# Patient Record
Sex: Female | Born: 1995 | Race: Black or African American | Hispanic: No | Marital: Single | Smoking: Former smoker
Health system: Southern US, Community
[De-identification: ages and names within clinical notes are randomized; demographics above are authoritative.]

## PROBLEM LIST (undated history)

## (undated) ENCOUNTER — Inpatient Hospital Stay (HOSPITAL_COMMUNITY): Payer: Self-pay

## (undated) DIAGNOSIS — Z8619 Personal history of other infectious and parasitic diseases: Secondary | ICD-10-CM

## (undated) DIAGNOSIS — B009 Herpesviral infection, unspecified: Secondary | ICD-10-CM

## (undated) DIAGNOSIS — D649 Anemia, unspecified: Secondary | ICD-10-CM

## (undated) DIAGNOSIS — L709 Acne, unspecified: Secondary | ICD-10-CM

## (undated) DIAGNOSIS — R519 Headache, unspecified: Secondary | ICD-10-CM

## (undated) HISTORY — PX: WISDOM TOOTH EXTRACTION: SHX21

## (undated) HISTORY — DX: Acne, unspecified: L70.9

## (undated) HISTORY — DX: Personal history of other infectious and parasitic diseases: Z86.19

## (undated) HISTORY — PX: HERNIA REPAIR: SHX51

---
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
## 2013-09-13 ENCOUNTER — Encounter: Payer: PRIVATE HEALTH INSURANCE | Admitting: Family Medicine

## 2013-09-22 ENCOUNTER — Ambulatory Visit (INDEPENDENT_AMBULATORY_CARE_PROVIDER_SITE_OTHER): Payer: PRIVATE HEALTH INSURANCE | Admitting: Family Medicine

## 2013-09-22 ENCOUNTER — Encounter: Payer: Self-pay | Admitting: Family Medicine

## 2013-09-22 VITALS — BP 110/74 | HR 72 | Temp 98.3°F | Ht 63.0 in | Wt 179.0 lb

## 2013-09-22 DIAGNOSIS — E669 Obesity, unspecified: Secondary | ICD-10-CM

## 2013-09-22 NOTE — Patient Instructions (Signed)
I recommend that you meet with our Registered Dietitian (Nutritionist), Dr. Vickki Muff for help addressing your dietary and eating habits.  You can schedule an appointment with her by calling her directly at [phone].   Fill out a food journal before you see the nutritionist.  Increase your exercise activity to 3 times a week.   Follow up with me in 2 months.

## 2013-09-23 NOTE — Assessment & Plan Note (Addendum)
Patient concerned about her weight and motivated to loose it.  Nexplanon may be playing a role in her weight gain, but I would like to try diet and exercise changes prior to removing it. If in 2-3 months with diet changes, she has not lost any weight or gained weight, we will discuss removing it.  In the meantime, recommended that patient meet with Dr. Gerilyn Pilgrim for diet management. Gave her food record to fill out and bring to the visit. Increase activity level to walking/jogging 3 times a week.

## 2013-09-23 NOTE — Progress Notes (Signed)
Patient ID: Peggy Mason    DOB: Mar 02, 1996, 17 y.o.   MRN: [id number] --- Subjective:  Peggy Mason is a 17 y.o.female who presents for initial evaluation for weight management.  - weight management: patient had a baby in November of 2013. She was 177lbs after delivery and lost weight down to 152 lbs in January 2014. She had nexplanon inserted at the time. Since then, she has gained 20 pounds. She reports feeling hungry all the time and eating to the point of being overly full.  She reports often eating at fastfood places like [employer]. For snacks, she eats chips and crackers. She reports that if she doesn't have time to eat at lunch, she eats a candy bar. She also states that she sometimes eats because she feels bored.  - exercise: she reports that she walks, about twice a week. 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   ROS: see HPI Past Medical History: reviewed and updated medications and allergies. Social History: Tobacco: none  Objective: Filed Vitals:   09/22/13 1116  BP: 110/74  Pulse: 72  Temp: 98.3 F (36.8 C)    Physical Examination:   General appearance - alert, well appearing, and in no distress Chest - clear to auscultation, no wheezes, rales or rhonchi, symmetric air entry Heart - normal rate, regular rhythm, normal S1, S2, no murmurs, rubs, clicks or gallops Neck - supple, no thyroid enlargement noted

## 2014-01-06 ENCOUNTER — Ambulatory Visit (INDEPENDENT_AMBULATORY_CARE_PROVIDER_SITE_OTHER): Payer: PRIVATE HEALTH INSURANCE | Admitting: Family Medicine

## 2014-01-06 ENCOUNTER — Encounter: Payer: Self-pay | Admitting: Family Medicine

## 2014-01-06 VITALS — BP 120/65 | HR 81 | Temp 98.4°F | Ht 63.0 in | Wt 179.0 lb

## 2014-01-06 DIAGNOSIS — L299 Pruritus, unspecified: Secondary | ICD-10-CM

## 2014-01-06 DIAGNOSIS — E669 Obesity, unspecified: Secondary | ICD-10-CM

## 2014-01-06 MED ORDER — TRIAMCINOLONE ACETONIDE 0.1 % EX CREA: 1.0000 "application " | TOPICAL_CREAM | Freq: Two times a day (BID) | CUTANEOUS | Status: DC

## 2014-01-06 MED ORDER — PRAMOXINE-BENZYL ALCOHOL 1-10 % EX GEL: 1.0000 "application " | Freq: Three times a day (TID) | CUTANEOUS | Status: DC | PRN

## 2014-01-06 NOTE — Assessment & Plan Note (Signed)
No evidence of irritation on exam. Symptoms have resolved on their own. For next recurrence of itching, prescribed triamcinolone cream and pramoxine

## 2014-01-06 NOTE — Progress Notes (Signed)
Patient ID: Peggy Mason    DOB: Jul 05, 1996, 18 y.o.   MRN: [id number] --- Subjective:  Peggy Mason is a 18 y.o.female who presents with itching and irritation at the site of her on her left arm. Started 3 days ago. Now better. Had been scratching it. Otherwise no swelling, no redness, no warmth. No break in skin. No fever.  Obesity: Has been exercising once or twice a week. She walks 3/2 miles once per week. She admits to skipping meals do to busy schedules. She was hoping to try a diet around smoothies that has helped her cousin.  ROS: see HPI Past Medical History: reviewed and updated medications and allergies. Social History: Tobacco: none  Objective: Filed Vitals:   01/06/14 1451  BP: 120/65  Pulse: 81  Temp: 98.4 F (36.9 C)    Physical Examination:   General appearance - alert, well appearing, and in no distress  Chest - clear to auscultation, no wheezes, rales or rhonchi, symmetric air entry Heart - normal rate, regular rhythm, normal S1, S2, no murmurs, rubs, clicks or gallops Skin - left  Upper arm: Normal-appearing skin, no erythema, no excoriation, Ride palpable and in place and intact measuring 4 cm. No tenderness on palpation.

## 2014-01-06 NOTE — Assessment & Plan Note (Signed)
Patient is to make appointment for nutrition clinic with me at the end of April

## 2014-01-06 NOTE — Patient Instructions (Signed)
Please make an appointment at the front desk for nutrition clinic with Dr. Gerilyn PilgrimSykes and me on April 23rd.   For the itching, We are going to use an over the counter medicine called itch x to use to wait for the steroid cream to kick in for the itching.

## 2014-02-02 ENCOUNTER — Encounter: Payer: Self-pay | Admitting: Family Medicine

## 2014-02-02 ENCOUNTER — Ambulatory Visit (INDEPENDENT_AMBULATORY_CARE_PROVIDER_SITE_OTHER): Payer: Self-pay | Admitting: Family Medicine

## 2014-02-02 VITALS — BP 124/78 | HR 80 | Ht 63.0 in | Wt 179.9 lb

## 2014-02-02 DIAGNOSIS — E669 Obesity, unspecified: Secondary | ICD-10-CM

## 2014-02-02 NOTE — Progress Notes (Signed)
Patient ID: Peggy Mason    DOB: Jul 19, 1996, 18 y.o.   MRN: [id number] --- Subjective:  Peggy Mason is a 18 y.o.female who presents for follow up on weight management.   Nutrition Survey:   - Cut out soda completely. Drinks juice 1oz once or twice a week.  - Trying to cut out sweets - Stopped eating McDonald's at breakfast but continues to eat at fast food restaurant in the morning 2-3 times per week - Tried to stay away from chocolate  Diet:  Usual eating pattern includes 2 (breakfast or dinner) meals, often skips lunch and 1 snacks per day.  Barriers: time to eat lunch or prepare lunch or breakfast in the am. 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   24-hr recall: B ( 8:15 AM)-   Frosted flanks and blueberry bagel with butter, water Snk ( AM)-    L (2 PM)-  Noodles with cheese 1 bowl Snk ( PM)-  Fruit loops 1 bowl D ( 9 PM)-  2 slices of pizza Snk ( PM)-    Was this a typical day? Would probably eat more than that on a regular basis.   ROS: see HPI Past Medical History: reviewed and updated medications and allergies. Social History: Tobacco: none  Objective: Filed Vitals:   02/02/14 0924  BP: 124/78  Pulse: 80    Physical Examination:   General appearance - alert, well appearing, and in no distress Chest - clear to auscultation, no wheezes, rales or rhonchi, symmetric air entry Heart - normal rate, regular rhythm, normal S1, S2, no murmurs

## 2014-02-02 NOTE — Assessment & Plan Note (Addendum)
Weight is stable. Congratulated her on stopping sodas and drinking water. There likely still is an imbalance between the calories in and calories out.   Goals discussed today include:  - increase vegetables to at least 2 meals a day. Discussed option of getting frozen vegetables.  - eat 3 meals a day and log each day how many meals you eat - She would like to try to monitor her calories. Goal of 1600-1800 calories per day.  - limit fast food eating to once a week - follow up in 1 month

## 2014-02-02 NOTE — Patient Instructions (Signed)
Goals for this session: - increase vegetables to at least 2 meals a day - eat 3 meals a day and log each day how many meals you eat - limit fast food eating to once a week - follow up in 1 month

## 2014-02-28 ENCOUNTER — Ambulatory Visit: Payer: Self-pay | Admitting: Family Medicine

## 2014-07-13 ENCOUNTER — Encounter (HOSPITAL_COMMUNITY): Payer: Self-pay | Admitting: *Deleted

## 2014-07-13 ENCOUNTER — Inpatient Hospital Stay (HOSPITAL_COMMUNITY)
Admission: AD | Admit: 2014-07-13 | Discharge: 2014-07-13 | Disposition: A | Discharge status: Home / Self Care | Payer: BC Managed Care – PPO | Source: Ambulatory Visit | Attending: Obstetrics & Gynecology | Admitting: Obstetrics & Gynecology

## 2014-07-13 DIAGNOSIS — B3731 Acute candidiasis of vulva and vagina: Secondary | ICD-10-CM | POA: Insufficient documentation

## 2014-07-13 DIAGNOSIS — N760 Acute vaginitis: Secondary | ICD-10-CM

## 2014-07-13 DIAGNOSIS — N898 Other specified noninflammatory disorders of vagina: Secondary | ICD-10-CM | POA: Diagnosis present

## 2014-07-13 DIAGNOSIS — Z880 Allergy status to penicillin: Secondary | ICD-10-CM | POA: Insufficient documentation

## 2014-07-13 DIAGNOSIS — B373 Candidiasis of vulva and vagina: Secondary | ICD-10-CM | POA: Diagnosis not present

## 2014-07-13 LAB — POCT PREGNANCY, URINE: PREG TEST UR: NEGATIVE

## 2014-07-13 LAB — URINALYSIS, ROUTINE W REFLEX MICROSCOPIC
Bilirubin Urine: NEGATIVE
GLUCOSE, UA: NEGATIVE mg/dL
Ketones, ur: NEGATIVE mg/dL
Leukocytes, UA: NEGATIVE
Nitrite: NEGATIVE
PROTEIN: NEGATIVE mg/dL
SPECIFIC GRAVITY, URINE: 1.015 (ref 1.005–1.030)
Urobilinogen, UA: 1 mg/dL (ref 0.0–1.0)
pH: 7 (ref 5.0–8.0)

## 2014-07-13 LAB — URINE MICROSCOPIC-ADD ON

## 2014-07-13 LAB — WET PREP, GENITAL
Clue Cells Wet Prep HPF POC: NONE SEEN
Trich, Wet Prep: NONE SEEN
Yeast Wet Prep HPF POC: NONE SEEN

## 2014-07-13 MED ORDER — FLUCONAZOLE 150 MG PO TABS: 150.0000 mg | ORAL_TABLET | Freq: Every day | ORAL | Status: DC

## 2014-07-13 NOTE — Discharge Instructions (Signed)

## 2014-07-13 NOTE — MAU Note (Signed)
Pt states here for eval of vaginal irritation noted when she wipes after voiding and has intermittent itching. Was intimate with new partner and has heard rumor that he has herpes. Pt states she felt like she was getting a yeast infection prior to intercourse. Denies abnormal discharge however.

## 2014-07-13 NOTE — MAU Provider Note (Signed)
History     CSN: [id number]  Arrival date and time: 07/13/14 1231   First Provider Initiated Contact with Patient 07/13/14 1446      Chief Complaint  Patient presents with  . Vaginal Discharge   HPI   [name] is a 18 y.o.female G1P1001 who presents with vaginal irritation and vaginitis. The symptoms started about three weeks ago; she has a new sexual partner. She is concerned because she heard a rumor that her partner has herpes. She denies symptoms of herpes.    OB History   Grav Para Term Preterm Abortions TAB SAB Ect Mult Living   1 1 1  0 0 0 0 0 0 1      Past Medical History  Diagnosis Date  . Acne   . History of chlamydia infection     Past Surgical History  Procedure Laterality Date  . Hernia repair      umbilical    Family History  Problem Relation Age of Onset  . Asthma Sister   . Learning disabilities Sister   . Kidney disease Sister     dialysis    History  Substance Use Topics  . Smoking status: Never Smoker   . Smokeless tobacco: Never Used  . Alcohol Use: No    Allergies:  Allergies  Allergen Reactions  . Penicillins Hives    Prescriptions prior to admission  Medication Sig Dispense Refill  . [DISCONTINUED] Pramoxine-Benzyl Alcohol 1-10 % GEL Apply 1 application topically 3 (three) times daily as needed.  35.4 g  0  . [DISCONTINUED] triamcinolone cream (KENALOG) 0.1 % Apply 1 application topically 2 (two) times daily. Do not use on face  30 g  0   Results for orders placed during the hospital encounter of 07/13/14 (from the past 48 hour(s))  URINALYSIS, ROUTINE W REFLEX MICROSCOPIC     Status: Abnormal   Collection Time    07/13/14  1:51 PM      Result Value Ref Range   Color, Urine YELLOW  YELLOW   APPearance CLEAR  CLEAR   Specific Gravity, Urine 1.015  1.005 - 1.030   pH 7.0  5.0 - 8.0   Glucose, UA NEGATIVE  NEGATIVE mg/dL   Hgb urine dipstick TRACE (*) NEGATIVE   Bilirubin Urine NEGATIVE  NEGATIVE   Ketones, ur  NEGATIVE  NEGATIVE mg/dL   Protein, ur NEGATIVE  NEGATIVE mg/dL   Urobilinogen, UA 1.0  0.0 - 1.0 mg/dL   Nitrite NEGATIVE  NEGATIVE   Leukocytes, UA NEGATIVE  NEGATIVE  URINE MICROSCOPIC-ADD ON     Status: Abnormal   Collection Time    07/13/14  1:51 PM      Result Value Ref Range   Squamous Epithelial / LPF FEW (*) RARE   RBC / HPF 0-2  <3 RBC/hpf   Bacteria, UA FEW (*) RARE   Urine-Other MUCOUS PRESENT    POCT PREGNANCY, URINE     Status: None   Collection Time    07/13/14  2:05 PM      Result Value Ref Range   Preg Test, Ur NEGATIVE  NEGATIVE   Comment:            THE SENSITIVITY OF THIS     METHODOLOGY IS >24 mIU/mL  WET PREP, GENITAL     Status: Abnormal   Collection Time    07/13/14  2:54 PM      Result Value Ref Range   Yeast Wet Prep HPF POC  NONE SEEN  NONE SEEN   Trich, Wet Prep NONE SEEN  NONE SEEN   Clue Cells Wet Prep HPF POC NONE SEEN  NONE SEEN   WBC, Wet Prep HPF POC FEW (*) NONE SEEN   Comment: MODERATE BACTERIA SEEN    Review of Systems  Constitutional: Negative for fever and chills.  Gastrointestinal: Negative for abdominal pain.  Genitourinary:       + white vaginal discharge. No odor +vaginitis    Physical Exam   Blood pressure 135/61, pulse 83, temperature 98.2 F (36.8 C), temperature source Oral, resp. rate 18, height 5\' 2"  (1.575 m), weight 83.178 kg (183 lb 6 oz), last menstrual period 07/12/2014, not currently breastfeeding.  Physical Exam  Constitutional: She appears well-developed and well-nourished. No distress.  HENT:  Head: Normocephalic.  Eyes: Pupils are equal, round, and reactive to light.  Neck: Neck supple.  Respiratory: Effort normal.  GI: Soft. She exhibits no distension. There is no tenderness. There is no rebound and no guarding.  Genitourinary: Vaginal discharge found.  Speculum exam: Vagina - Moderate amount of creamy, brown discharge, no odor Cervix - No contact bleeding Bimanual exam: Cervix closed, no CMT  Uterus  non tender, normal size Adnexa non tender, no masses bilaterally GC/Chlam, wet prep done Chaperone present for exam.   Skin: Skin is warm. She is not diaphoretic.  Psychiatric: Her behavior is normal.    MAU Course  Procedures None  MDM Wet prep GC HIV UA   Assessment and Plan   A: Vaginitis Presumed yeast vaginitis  P: Discharge home in stable condition RX: Diflucan  Return to MAU as needed, for emergencies.   Iona HansenJennifer Irene Tinea Nobile, NP 07/13/2014 3:50 PM

## 2014-07-14 LAB — HIV ANTIBODY (ROUTINE TESTING W REFLEX): HIV: NONREACTIVE

## 2014-07-14 NOTE — MAU Provider Note (Signed)

## 2014-07-15 LAB — GC/CHLAMYDIA PROBE AMP
CT Probe RNA: POSITIVE — AB
GC Probe RNA: NEGATIVE

## 2014-07-18 ENCOUNTER — Telehealth: Payer: Self-pay

## 2014-07-18 DIAGNOSIS — A749 Chlamydial infection, unspecified: Secondary | ICD-10-CM

## 2014-07-18 MED ORDER — AZITHROMYCIN 500 MG PO TABS: 1000.0000 mg | ORAL_TABLET | Freq: Once | ORAL | Status: DC

## 2014-07-18 MED ORDER — FLUCONAZOLE 150 MG PO TABS: 150.0000 mg | ORAL_TABLET | Freq: Every day | ORAL | Status: DC

## 2014-07-18 NOTE — Telephone Encounter (Signed)
After checking with [facility] found out RX for Zithromax 1000mg  (2 500mg  tabs)would cost $19. Called patient and this is too expensive. [facility] Informed patient. Patient requests to have [facility]rom[facility]. Informed patient I would. No further questions or concerns.

## 2014-07-18 NOTE — Telephone Encounter (Signed)
Message copied by Louanna RawAMPBELL, Evanna Washinton M on Mon Jul 18, 2014 12:52 PM ------      Message from: Pennie BanterSMITH, MARNI W      Created: Mon Jul 18, 2014 11:21 AM       Patient returned call, notified her of positive chlamydia culture.  Patient has not been treated and will [facility] [address]  Instructed patient to notify her partner for treatment.  Report of treatment faxed to health department. ------

## 2014-07-18 NOTE — Telephone Encounter (Signed)
Called patient and informed her medication for chlamydia treatment sent to pharmacy. Patient states she does not have insurance and would like to know if she could get it sent somewhere else-- advised patient [facility] may have it for much cheaper. [facility] [address] Informed patient I would do so. NO further questions or concerns.

## 2014-08-15 ENCOUNTER — Encounter (HOSPITAL_COMMUNITY): Payer: Self-pay | Admitting: *Deleted

## 2014-12-06 ENCOUNTER — Emergency Department (HOSPITAL_COMMUNITY)
Admission: EM | Admit: 2014-12-06 | Discharge: 2014-12-06 | Disposition: A | Discharge status: Home / Self Care | Payer: BLUE CROSS/BLUE SHIELD | Attending: Emergency Medicine | Admitting: Emergency Medicine

## 2014-12-06 ENCOUNTER — Encounter (HOSPITAL_COMMUNITY): Payer: Self-pay

## 2014-12-06 DIAGNOSIS — M542 Cervicalgia: Secondary | ICD-10-CM | POA: Insufficient documentation

## 2014-12-06 DIAGNOSIS — Z880 Allergy status to penicillin: Secondary | ICD-10-CM | POA: Diagnosis not present

## 2014-12-06 DIAGNOSIS — Z8619 Personal history of other infectious and parasitic diseases: Secondary | ICD-10-CM | POA: Insufficient documentation

## 2014-12-06 DIAGNOSIS — Z872 Personal history of diseases of the skin and subcutaneous tissue: Secondary | ICD-10-CM | POA: Insufficient documentation

## 2014-12-06 DIAGNOSIS — J020 Streptococcal pharyngitis: Secondary | ICD-10-CM | POA: Diagnosis not present

## 2014-12-06 DIAGNOSIS — Z3202 Encounter for pregnancy test, result negative: Secondary | ICD-10-CM | POA: Diagnosis not present

## 2014-12-06 DIAGNOSIS — R51 Headache: Secondary | ICD-10-CM | POA: Diagnosis present

## 2014-12-06 LAB — I-STAT CHEM 8, ED
BUN: 9 mg/dL (ref 6–23)
CALCIUM ION: 1.1 mmol/L — AB (ref 1.12–1.23)
Chloride: 106 mmol/L (ref 96–112)
Creatinine, Ser: 0.5 mg/dL (ref 0.50–1.10)
GLUCOSE: 95 mg/dL (ref 70–99)
HEMATOCRIT: 38 % (ref 36.0–46.0)
HEMOGLOBIN: 12.9 g/dL (ref 12.0–15.0)
Potassium: 3.5 mmol/L (ref 3.5–5.1)
Sodium: 139 mmol/L (ref 135–145)
TCO2: 18 mmol/L (ref 0–100)

## 2014-12-06 LAB — URINE MICROSCOPIC-ADD ON

## 2014-12-06 LAB — URINALYSIS, ROUTINE W REFLEX MICROSCOPIC
Bilirubin Urine: NEGATIVE
GLUCOSE, UA: NEGATIVE mg/dL
KETONES UR: NEGATIVE mg/dL
LEUKOCYTES UA: NEGATIVE
NITRITE: NEGATIVE
PH: 7 (ref 5.0–8.0)
Protein, ur: NEGATIVE mg/dL
Specific Gravity, Urine: 1.023 (ref 1.005–1.030)
Urobilinogen, UA: 1 mg/dL (ref 0.0–1.0)

## 2014-12-06 LAB — RAPID STREP SCREEN (MED CTR MEBANE ONLY): Streptococcus, Group A Screen (Direct): POSITIVE — AB

## 2014-12-06 LAB — PREGNANCY, URINE: Preg Test, Ur: NEGATIVE

## 2014-12-06 MED ORDER — DIPHENHYDRAMINE HCL 50 MG/ML IJ SOLN
25.0000 mg | Freq: Once | INTRAMUSCULAR | Status: AC
  Administered 2014-12-06: 25 mg via INTRAVENOUS
  Filled 2014-12-06: qty 1

## 2014-12-06 MED ORDER — DEXAMETHASONE SODIUM PHOSPHATE 10 MG/ML IJ SOLN
10.0000 mg | Freq: Once | INTRAMUSCULAR | Status: AC
  Administered 2014-12-06: 10 mg via INTRAVENOUS
  Filled 2014-12-06: qty 1

## 2014-12-06 MED ORDER — METOCLOPRAMIDE HCL 5 MG/ML IJ SOLN
10.0000 mg | Freq: Once | INTRAMUSCULAR | Status: AC
  Administered 2014-12-06: 10 mg via INTRAVENOUS
  Filled 2014-12-06: qty 2

## 2014-12-06 MED ORDER — SODIUM CHLORIDE 0.9 % IV BOLUS (SEPSIS)
1000.0000 mL | Freq: Once | INTRAVENOUS | Status: AC
  Administered 2014-12-06: 1000 mL via INTRAVENOUS

## 2014-12-06 MED ORDER — CLINDAMYCIN HCL 150 MG PO CAPS: 150.0000 mg | ORAL_CAPSULE | Freq: Four times a day (QID) | ORAL | Status: DC

## 2014-12-06 NOTE — Discharge Instructions (Signed)

## 2014-12-06 NOTE — ED Notes (Signed)
MD at bedside. 

## 2014-12-06 NOTE — ED Provider Notes (Signed)
19 year old female, no chronic medical problems who presents with a complaint of a headache. This has been present for 1 week, daily, intermittent, nothing makes better or worse except for occasional photophobia. Occasional nausea, felt like she had fevers and chills last night and has now developed a sore throat which is worse with palpation of the neck, worse with swallowing, associated with a mild cough which has now improved. On exam the patient has no hepatosplenomegaly, mild lymphadenopathy of the right posterior cervical and anterior cervical chain, no exudate asymmetry hypertrophy or erythema of the pharynx, moist mucous membranes, normal phonation, normal heart and lung sounds. Neurologic exam is unremarkable with normal speech cranial nerves III through XII, coordination and strength. The patient will be given a headache cocktail, check for strep, otherwise patient is well-appearing and likely has a upper respiratory infection-related headache. Doubt meningitis, doubt encephalitis or other significant intracranial findings. No indication for CT.    Medical screening examination/treatment/procedure(s) were conducted as a shared visit with non-physician practitioner(s) and myself.  I personally evaluated the patient during the encounter.  Clinical Impression:   Final diagnoses:  Strep pharyngitis         Vida RollerBrian D Charletha Dalpe, MD 12/06/14 2109

## 2014-12-06 NOTE — ED Notes (Signed)
Pt states she has been having a migraine for the past week. It is starting to affect her right ear and making her throat hurt. Having chills also the past few days.

## 2014-12-06 NOTE — Progress Notes (Signed)
Provided GoodRx card for discount on clindamycin prescription.

## 2014-12-06 NOTE — ED Provider Notes (Signed)
CSN: [id number]     Arrival date & time 12/06/14  0865 History   First MD Initiated Contact with Patient 12/06/14 1009     Chief Complaint  Patient presents with  . Migraine     (Consider location/radiation/quality/duration/timing/severity/associated sxs/prior Treatment) HPI   PCP: Street, Cristal Deer, MD Blood pressure 114/74, pulse 63, temperature 98.4 F (36.9 C), temperature source Oral, resp. rate 18, SpO2 100 %.  Peggy Mason is a 19 y.o.female without any significant PMH presents to the ER with complaints of headache that is bitemporal and at the base of her neck. The pain came on gradually, is intermittent, waxes and wanes. Currently the pain is a 5/10.  The symptoms started 1 week ago and has progressed to a right side sore throat. She endorses chills, mild cough and body aches. Endorses subjective fevers. he has the Nexplanon for birth control.   Negative Review of Symptoms: She has not had any chest pain, abdominal pain, diarrhea, dysuria, or vaginal bleeding. No syncope, confusion, change in vision.   Past Medical History  Diagnosis Date  . Acne   . History of chlamydia infection    Past Surgical History  Procedure Laterality Date  . Hernia repair      umbilical   Family History  Problem Relation Age of Onset  . Asthma Sister   . Learning disabilities Sister   . Kidney disease Sister     dialysis   History  Substance Use Topics  . Smoking status: Never Smoker   . Smokeless tobacco: Never Used  . Alcohol Use: No   OB History    Gravida Para Term Preterm AB TAB SAB Ectopic Multiple Living   0 0 0 0 0 0 1     Review of Systems 10 Systems reviewed and are negative for acute change except as noted in the HPI.   Allergies  Penicillins  Home Medications   Prior to Admission medications   Medication Sig Start Date End Date Taking? Authorizing Provider  acetaminophen (TYLENOL) 325 MG tablet Take 650 mg by mouth every 6 (six) hours as needed for  mild pain.   Yes Historical Provider, MD  etonogestrel (NEXPLANON) 68 MG IMPL implant 1 each by Subdermal route once.   Yes Historical Provider, MD  ibuprofen (ADVIL,MOTRIN) 800 MG tablet Take 800 mg by mouth every 8 (eight) hours as needed for headache or mild pain.   Yes Historical Provider, MD  azithromycin (ZITHROMAX) 500 MG tablet Take 2 tablets (1,000 mg total) by mouth once. Patient not taking: Reported on 12/06/2014 07/18/14   Willodean Rosenthal, MD  clindamycin (CLEOCIN) 150 MG capsule Take 1 capsule (150 mg total) by mouth every 6 (six) hours. 12/06/14   Tsutomu Barfoot Irine Seal, PA-C  fluconazole (DIFLUCAN) 150 MG tablet Take 1 tablet (150 mg total) by mouth daily. Patient not taking: Reported on 12/06/2014 07/18/14   Willodean Rosenthal, MD   BP 114/74 mmHg  Pulse 63  Temp(Src) 98.4 F (36.9 C) (Oral)  Resp 18  SpO2 100% Physical Exam  Constitutional: She appears well-developed and well-nourished. No distress.  HENT:  Head: Normocephalic and atraumatic.  Right Ear: Tympanic membrane and ear canal normal.  Left Ear: Tympanic membrane and ear canal normal.  Nose: Nose normal. Right sinus exhibits no maxillary sinus tenderness and no frontal sinus tenderness. Left sinus exhibits no maxillary sinus tenderness and no frontal sinus tenderness.  Mouth/Throat: Uvula is midline and oropharynx is clear and moist.  Eyes: Pupils are  equal, round, and reactive to light.  Neck: Normal range of motion. Neck supple. Muscular tenderness (laterally to the right) present. No spinous process tenderness present. No rigidity. Normal range of motion present. No Brudzinski's sign and no Kernig's sign noted.  Cardiovascular: Normal rate and regular rhythm.   Pulmonary/Chest: Effort normal.  Abdominal: Soft. Bowel sounds are normal. There is no tenderness. There is no rigidity, no rebound, no guarding and no CVA tenderness.  Lymphadenopathy:       Head (right side): Tonsillar adenopathy present. No  submental adenopathy present.    She has cervical adenopathy.  Neurological: She is alert.  Cranial nerves II-VIII and X-XII evaluated and show no deficits. Pt alert and oriented x 3 Upper and lower extremity strength is symmetrical and physiologic Normal muscular tone No facial droop Coordination intact  Skin: Skin is warm and dry.  Nursing note and vitals reviewed.     ED Course  Procedures (including critical care time) Labs Review Labs Reviewed  RAPID STREP SCREEN - Abnormal; Notable for the following:    Streptococcus, Group A Screen (Direct) POSITIVE (*)    All other components within normal limits  URINALYSIS, ROUTINE W REFLEX MICROSCOPIC - Abnormal; Notable for the following:    Hgb urine dipstick TRACE (*)    All other components within normal limits  URINE MICROSCOPIC-ADD ON - Abnormal; Notable for the following:    Bacteria, UA FEW (*)    All other components within normal limits  I-STAT CHEM 8, ED - Abnormal; Notable for the following:    Calcium, Ion 1.10 (*)    All other components within normal limits  PREGNANCY, URINE    Imaging Review No results found.   EKG Interpretation None      MDM   Final diagnoses:  Strep pharyngitis    Pt has negative urinalysis, neg urine preg, i-stat chem 8 is not acute. Pt has positive strep. She has allergy to Penicillin and will be treated with Clindamycin.  Medications  sodium chloride 0.9 % bolus 1,000 mL (1,000 mLs Intravenous New Bag/Given 12/06/14 1108)  diphenhydrAMINE (BENADRYL) injection 25 mg (25 mg Intravenous Given 12/06/14 1111)  metoCLOPramide (REGLAN) injection 10 mg (10 mg Intravenous Given 12/06/14 1111)  dexamethasone (DECADRON) injection 10 mg (10 mg Intravenous Given 12/06/14 1111)    Medications given in the ED has helped patient symptoms. Her pain has decreased to 2/10. She is non toxic appearing, afebrile, non meningeal signs.  18 y.o.Peggy Mason's evaluation in the Emergency Department is  complete. It has been determined that no acute conditions requiring further emergency intervention are present at this time. The patient/guardian have been advised of the diagnosis and plan. We have discussed signs and symptoms that warrant return to the ED, such as changes or worsening in symptoms.  Vital signs are stable at discharge. Filed Vitals:   12/06/14 1001  BP: 114/74  Pulse: 63  Temp: 98.4 F (36.9 C)  Resp: 18    Patient/guardian has voiced understanding and agreed to follow-up with the PCP or specialist.      Dorthula Matasiffany G Sammuel Blick, PA-C 12/06/14 1149  Vida RollerBrian D Miller, MD 12/06/14 2109

## 2014-12-06 NOTE — ED Notes (Signed)
Brought pt back to room; pt getting undressed and into a gown at this time 

## 2015-04-04 ENCOUNTER — Inpatient Hospital Stay (HOSPITAL_COMMUNITY)
Admission: AD | Admit: 2015-04-04 | Discharge: 2015-04-04 | Disposition: A | Discharge status: Home / Self Care | Payer: BLUE CROSS/BLUE SHIELD | Source: Ambulatory Visit | Attending: Family Medicine | Admitting: Family Medicine

## 2015-04-04 ENCOUNTER — Encounter (HOSPITAL_COMMUNITY): Payer: Self-pay | Admitting: *Deleted

## 2015-04-04 DIAGNOSIS — A599 Trichomoniasis, unspecified: Secondary | ICD-10-CM | POA: Diagnosis not present

## 2015-04-04 DIAGNOSIS — Z880 Allergy status to penicillin: Secondary | ICD-10-CM | POA: Diagnosis not present

## 2015-04-04 DIAGNOSIS — R1084 Generalized abdominal pain: Secondary | ICD-10-CM | POA: Diagnosis present

## 2015-04-04 DIAGNOSIS — N926 Irregular menstruation, unspecified: Secondary | ICD-10-CM | POA: Diagnosis not present

## 2015-04-04 LAB — URINALYSIS, ROUTINE W REFLEX MICROSCOPIC
BILIRUBIN URINE: NEGATIVE
GLUCOSE, UA: NEGATIVE mg/dL
Ketones, ur: NEGATIVE mg/dL
Leukocytes, UA: NEGATIVE
Nitrite: NEGATIVE
PH: 7.5 (ref 5.0–8.0)
PROTEIN: NEGATIVE mg/dL
SPECIFIC GRAVITY, URINE: 1.02 (ref 1.005–1.030)
UROBILINOGEN UA: 1 mg/dL (ref 0.0–1.0)

## 2015-04-04 LAB — URINE MICROSCOPIC-ADD ON

## 2015-04-04 LAB — WET PREP, GENITAL
Clue Cells Wet Prep HPF POC: NONE SEEN
Yeast Wet Prep HPF POC: NONE SEEN

## 2015-04-04 LAB — POCT PREGNANCY, URINE: Preg Test, Ur: NEGATIVE

## 2015-04-04 MED ORDER — AZITHROMYCIN 250 MG PO TABS
1000.0000 mg | ORAL_TABLET | Freq: Once | ORAL | Status: AC
  Administered 2015-04-04: 1000 mg via ORAL
  Filled 2015-04-04: qty 4

## 2015-04-04 MED ORDER — METRONIDAZOLE 500 MG PO TABS
2000.0000 mg | ORAL_TABLET | Freq: Once | ORAL | Status: AC
  Administered 2015-04-04: 2000 mg via ORAL
  Filled 2015-04-04: qty 4

## 2015-04-04 NOTE — MAU Note (Addendum)
PT SAYS UNSURE OF  LMP  BECAUSE  ON  B EXPLANON  IN LEFT  ARM  BY [facility]   .    SHE STARTED  SPOTTING  ON Sunday-   OFF/ ON   .  PANTY LINER- ON IN TRIAGE --     NOTHING-  BUT  WHEN  SHE WIPES  SHE  SEES  WATER.  ALSO FEELS   PAIN IN HER STOMACH-  ALWAYS FEELS  HUNGRY

## 2015-04-04 NOTE — Discharge Instructions (Signed)
Trichomoniasis °Trichomoniasis is an infection caused by an organism called Trichomonas. The infection can affect both women and men. In women, the outer female genitalia and the vagina are affected. In men, the penis is mainly affected, but the prostate and other reproductive organs can also be involved. Trichomoniasis is a sexually transmitted infection (STI) and is most often passed to another person through sexual contact.  °RISK FACTORS °· Having unprotected sexual intercourse. °· Having sexual intercourse with an infected partner. °SIGNS AND SYMPTOMS  °Symptoms of trichomoniasis in women include: °· Abnormal gray-green frothy vaginal discharge. °· Itching and irritation of the vagina. °· Itching and irritation of the area outside the vagina. °Symptoms of trichomoniasis in men include:  °· Penile discharge with or without pain. °· Pain during urination. This results from inflammation of the urethra. °DIAGNOSIS  °Trichomoniasis may be found during a Pap test or physical exam. Your health care provider may use one of the following methods to help diagnose this infection: °· Examining vaginal discharge under a microscope. For men, urethral discharge would be examined. °· Testing the pH of the vagina with a test tape. °· Using a vaginal swab test that checks for the Trichomonas organism. A test is available that provides results within a few minutes. °· Doing a culture test for the organism. This is not usually needed. °TREATMENT  °· You may be given medicine to fight the infection. Women should inform their health care provider if they could be or are pregnant. Some medicines used to treat the infection should not be taken during pregnancy. °· Your health care provider may recommend over-the-counter medicines or creams to decrease itching or irritation. °· Your sexual partner will need to be treated if infected. °HOME CARE INSTRUCTIONS  °· Take medicines only as directed by your health care provider. °· Take  over-the-counter medicine for itching or irritation as directed by your health care provider. °· Do not have sexual intercourse while you have the infection. °· Women should not douche or wear tampons while they have the infection. °· Discuss your infection with your partner. Your partner may have gotten the infection from you, or you may have gotten it from your partner. °· Have your sex partner get examined and treated if necessary. °· Practice safe, informed, and protected sex. °· See your health care provider for other STI testing. °SEEK MEDICAL CARE IF:  °· You still have symptoms after you finish your medicine. °· You develop abdominal pain. °· You have pain when you urinate. °· You have bleeding after sexual intercourse. °· You develop a rash. °· Your medicine makes you sick or makes you throw up (vomit). °MAKE SURE YOU: °· Understand these instructions. °· Will watch your condition. °· Will get help right away if you are not doing well or get worse. °Document Released: 03/26/2001 Document Revised: 02/14/2014 Document Reviewed: 07/12/2013 °ExitCare® Patient Information ©2015 [employer]. This information is not intended to replace advice given to you by your health care provider. Make sure you discuss any questions you have with your health care provider. ° °Sexually Transmitted Disease °A sexually transmitted disease (STD) is a disease or infection often passed to another person during sex. However, STDs can be passed through nonsexual ways. An STD can be passed through: °· Spit (saliva). °· Semen. °· Blood. °· Mucus from the vagina. °· Pee (urine). °HOW CAN I LESSEN MY CHANCES OF GETTING AN STD? °· Use: °¨ Latex condoms. °¨ Water-soluble lubricants with condoms. Do not use petroleum   jelly or oils. °¨ Dental dams. These are small pieces of latex that are used as a barrier during oral sex. °· Avoid having more than one sex partner. °· Do not have sex with someone who has other sex partners. °· Do not have  sex with anyone you do not know or who is at high risk for an STD. °· Avoid risky sex that can break your skin. °· Do not have sex if you have open sores on your mouth or skin. °· Avoid drinking too much alcohol or taking illegal drugs. Alcohol and drugs can affect your good judgment. °· Avoid oral and anal sex acts. °· Get shots (vaccines) for HPV and hepatitis. °· If you are at risk of being infected with HIV, it is advised that you take a certain medicine daily to prevent HIV infection. This is called pre-exposure prophylaxis (PrEP). You may be at risk if: °¨ You are a man who has sex with other men (MSM). °¨ You are attracted to the opposite sex (heterosexual) and are having sex with more than one partner. °¨ You take drugs with a needle. °¨ You have sex with someone who has HIV. °· Talk with your doctor about if you are at high risk of being infected with HIV. If you begin to take PrEP, get tested for HIV first. Get tested every 3 months for as long as you are taking PrEP. °WHAT SHOULD I DO IF I THINK I HAVE AN STD? °· See your doctor. °· Tell your sex partner(s) that you have an STD. They should be tested and treated. °· Do not have sex until your doctor says it is okay. °WHEN SHOULD I GET HELP? °Get help right away if: °· You have bad belly (abdominal) pain. °· You are a man and have puffiness (swelling) or pain in your testicles. °· You are a woman and have puffiness in your vagina. °Document Released: 11/07/2004 Document Revised: 10/05/2013 Document Reviewed: 03/26/2013 °ExitCare® Patient Information ©2015 [employer]. This information is not intended to replace advice given to you by your health care provider. Make sure you discuss any questions you have with your health care provider. ° °

## 2015-04-04 NOTE — MAU Provider Note (Signed)
History     CSN: [id number]  Arrival date and time: 04/04/15 1900   First Provider Initiated Contact with Patient 04/04/15 2035      No chief complaint on file.  HPI  Peggy Mason is a 19 y.o. G1P1001 who presents to MAU today with complaint of spotting and diffuse abdominal pain. The patient states spotting yesterday, but none today. She states intermittent diffuse abdominal pain since yesterday. The patient has a Nexplanon since 10/2012. She states regular periods until recently. She states irregular bleeding x 3-4 months. She denies pain now. She is sexually active and uses condoms most of the time.   OB History    Gravida Para Term Preterm AB TAB SAB Ectopic Multiple Living   0 0 0 0 0 0 1      Past Medical History  Diagnosis Date  . Acne   . History of chlamydia infection   . Medical history non-contributory     Past Surgical History  Procedure Laterality Date  . Hernia repair      umbilical    Family History  Problem Relation Age of Onset  . Asthma Sister   . Learning disabilities Sister   . Kidney disease Sister     dialysis    History  Substance Use Topics  . Smoking status: Never Smoker   . Smokeless tobacco: Never Used  . Alcohol Use: No    Allergies:  Allergies  Allergen Reactions  . Amoxicillin Hives  . Penicillins Hives    Prescriptions prior to admission  Medication Sig Dispense Refill Last Dose  . etonogestrel (NEXPLANON) 68 MG IMPL implant 1 each by Subdermal route once. Implant is good until 2017   04/04/2015 at Unknown time    Review of Systems  Constitutional: Negative for fever and malaise/fatigue.  Gastrointestinal: Negative for nausea, vomiting, abdominal pain, diarrhea and constipation.  Genitourinary: Negative for dysuria, urgency and frequency.       Neg - vaginal discharge, bleeding   Physical Exam   Blood pressure 118/60, pulse 72, temperature 98.3 F (36.8 C), temperature source Oral, resp. rate 20, height 5'  2" (1.575 m), weight 191 lb 2 oz (86.694 kg).  Physical Exam  Nursing note and vitals reviewed. Constitutional: She is oriented to person, place, and time. She appears well-developed and well-nourished. No distress.  HENT:  Head: Normocephalic and atraumatic.  Cardiovascular: Normal rate.   Respiratory: Effort normal.  GI: Soft. She exhibits no distension and no mass. There is no tenderness. There is no rebound and no guarding.  Genitourinary: Uterus is tender (mild). Uterus is not enlarged. Cervix exhibits no motion tenderness, no discharge and no friability. Right adnexum displays no mass and no tenderness. Left adnexum displays tenderness (mild). Left adnexum displays no mass. No bleeding in the vagina. Vaginal discharge (small amount of thin, white, frothy discharge) found.  Neurological: She is alert and oriented to person, place, and time.  Skin: Skin is warm and dry. No erythema.  Psychiatric: She has a normal mood and affect.   Results for orders placed or performed during the hospital encounter of 04/04/15 (from the past 24 hour(s))  Urinalysis, Routine w reflex microscopic (not at [facility])     Status: Abnormal   Collection Time: 04/04/15  7:42 PM  Result Value Ref Range   Color, Urine YELLOW YELLOW   APPearance CLEAR CLEAR   Specific Gravity, Urine 1.020 1.005 - 1.030   pH 7.5 5.0 - 8.0  Glucose, UA NEGATIVE NEGATIVE mg/dL   Hgb urine dipstick TRACE (A) NEGATIVE   Bilirubin Urine NEGATIVE NEGATIVE   Ketones, ur NEGATIVE NEGATIVE mg/dL   Protein, ur NEGATIVE NEGATIVE mg/dL   Urobilinogen, UA 1.0 0.0 - 1.0 mg/dL   Nitrite NEGATIVE NEGATIVE   Leukocytes, UA NEGATIVE NEGATIVE  Urine microscopic-add on     Status: Abnormal   Collection Time: 04/04/15  7:42 PM  Result Value Ref Range   Squamous Epithelial / LPF RARE RARE   WBC, UA 7-10 <3 WBC/hpf   RBC / HPF 0-2 <3 RBC/hpf   Bacteria, UA FEW (A) RARE   Urine-Other MUCOUS PRESENT   Pregnancy, urine POC     Status: None    Collection Time: 04/04/15  8:04 PM  Result Value Ref Range   Preg Test, Ur NEGATIVE NEGATIVE  Wet prep, genital     Status: Abnormal   Collection Time: 04/04/15  8:52 PM  Result Value Ref Range   Yeast Wet Prep HPF POC NONE SEEN NONE SEEN   Trich, Wet Prep FEW (A) NONE SEEN   Clue Cells Wet Prep HPF POC NONE SEEN NONE SEEN   WBC, Wet Prep HPF POC FEW (A) NONE SEEN     MAU Course  Procedures  MDM Patient notes from [facility] reviewed. Hunger and weight gain have been a concern since 2014 according to these notes.  UPT - negative UA, wet prep, GC/Chlamydia, RPR, HIV today Discussed results with patient, advised her of option for presumptive treatment for GC/Chlamydia. Due to PCN allergy will wait for Gonorrhea and will only treat if positive. 2 G Flagyl, 1 G Zithromax Assessment and Plan  A: Trichomonas Irregular menses with Nexplanon  P: Discharge home Treated in MAU for trichomonas and possible GC/chlamydia Partner treatment advised Warnings signs for PID discussed Patient advised to follow-up with [facility] for irregular bleeding and possible change in birth control Patient may return to MAU as needed or if her condition were to change or worsen   Marny Lowenstein, PA-C  04/04/2015, 9:54 PM

## 2015-04-05 LAB — GC/CHLAMYDIA PROBE AMP (~~LOC~~) NOT AT ARMC
CHLAMYDIA, DNA PROBE: NEGATIVE
NEISSERIA GONORRHEA: NEGATIVE

## 2015-04-05 LAB — RPR: RPR: NONREACTIVE

## 2015-04-05 LAB — HIV ANTIBODY (ROUTINE TESTING W REFLEX): HIV Screen 4th Generation wRfx: NONREACTIVE

## 2015-04-12 ENCOUNTER — Encounter: Payer: Self-pay | Admitting: Family Medicine

## 2015-04-12 ENCOUNTER — Ambulatory Visit (INDEPENDENT_AMBULATORY_CARE_PROVIDER_SITE_OTHER): Payer: BLUE CROSS/BLUE SHIELD | Admitting: Family Medicine

## 2015-04-12 VITALS — BP 128/67 | HR 78 | Temp 98.2°F | Ht 62.0 in | Wt 190.0 lb

## 2015-04-12 DIAGNOSIS — Z308 Encounter for other contraceptive management: Secondary | ICD-10-CM

## 2015-04-12 DIAGNOSIS — Z3046 Encounter for surveillance of implantable subdermal contraceptive: Secondary | ICD-10-CM

## 2015-04-12 NOTE — Patient Instructions (Addendum)
It was a pleasure seeing you today in our clinic. Today we removed your nexplanon device. Here is the treatment plan we have discussed and agreed upon together:   - If the site of removal becomes red, inflamed, painful, or you notice drainage please be seen in our office or urgent care immediately - take tylenol for the next 2-3 days for any discomfort. - continue to think about other forms of contraception as we discussed today.

## 2015-04-13 NOTE — Assessment & Plan Note (Signed)
Patient counseled on risks of procedure and the new risk of pregnancy. Patient aware. - tolerated procedure well. - described red flag symptoms to watch for and when to have site rechecked. - Contraceptive handout provided as patient was adamant that she did not desire any prescribed forms of contraceptives at this time. - offered condoms -- patient refused. - patient informed that site may be sore for the next 48-72 hrs. Tylenol for this pain/discomfort would likely work best. Patient stated her understanding.

## 2015-04-13 NOTE — Progress Notes (Signed)
PROCEDURE NOTE: NEXPLANON  REMOVAL Patient given informed consent and signed copy in the chart. Left arm area prepped and draped in the usual sterile fashion. Three cc of lidocaine without epinephrine 1% used for local anesthesia. A small stab incision was made close to the nexplanon with scalpel. Hemostats were used to withdraw the nexplanon. A small bandage was applied over a steri strip  No complications.Patient given follow up instructions should she experience redness, swelling at sight or fever in the next 24 hours. Patient was reminded this totally removes her nexplanon contraceptive devise. (she can now potentially conceive)  Kathee DeltonIan D Lenka Zhao, MD,MS,  PGY1 04/13/2015 12:41 PM

## 2015-04-13 NOTE — Progress Notes (Signed)
   HPI  CC: Nexplanon removal Patient here for Nexplanon removal. She states that she has not had any significant side effects from this device except for weight gain. We discussed the other options for contraception at length. Patient did not have a decision made for new contraception methods, and did not want to wait. Patient does not currently desire to become pregnant in the near future. Patient seemed very attentive to our contraception discussed, a handout was provided. When asked what she would do if she were to get pregnant after nexplanon removal she said she's "just deal with it" and she doesn't "believe in having an abortion".   We also discussed the other contributing factors for weight gain and how to combat these. Patient seemed to understand that contraceptive methods can cause some weight gain but unlikely play much of a role once exceeding 10lbs of gain.   ROS: denies menorrhagia, dysmenorrhea, discharge, itching, dysuria, dyspareunia, fevers, chills, HA, n/v/d.  Past medical history and social history reviewed and updated in the EMR as appropriate.  Objective: BP 128/67 mmHg  Pulse 78  Temp(Src) 98.2 F (36.8 C) (Oral)  Ht 5\' 2"  (1.575 m)  Wt 190 lb (86.183 kg)  BMI 34.74 kg/m2 Gen: NAD, alert, cooperative, and pleasant. HEENT: NCAT, EOMI, PERRL CV: RRR, no murmur Resp: CTAB, no wheezes, non-labored Abd: obese, SNTND, BS present, no guarding or organomegaly Ext: No edema, warm Neuro: Alert and oriented, Speech clear, No gross deficits  Assessment and plan:  Nexplanon removal Patient counseled on risks of procedure and the new risk of pregnancy. Patient aware. - tolerated procedure well. - described red flag symptoms to watch for and when to have site rechecked. - Contraceptive handout provided as patient was adamant that she did not desire any prescribed forms of contraceptives at this time. - offered condoms -- patient refused. - patient informed that site may  be sore for the next 48-72 hrs. Tylenol for this pain/discomfort would likely work best. Patient stated her understanding.    Kathee DeltonIan D McKeag, MD,MS,  PGY1 04/13/2015 12:40 PM

## 2015-07-26 ENCOUNTER — Inpatient Hospital Stay (HOSPITAL_COMMUNITY)
Admission: AD | Admit: 2015-07-26 | Discharge: 2015-07-26 | Disposition: A | Discharge status: Home / Self Care | Payer: BLUE CROSS/BLUE SHIELD | Source: Ambulatory Visit | Attending: Obstetrics and Gynecology | Admitting: Obstetrics and Gynecology

## 2015-07-26 ENCOUNTER — Encounter (HOSPITAL_COMMUNITY): Payer: Self-pay

## 2015-07-26 ENCOUNTER — Inpatient Hospital Stay (HOSPITAL_COMMUNITY): Payer: BLUE CROSS/BLUE SHIELD

## 2015-07-26 DIAGNOSIS — Z3A01 Less than 8 weeks gestation of pregnancy: Secondary | ICD-10-CM | POA: Diagnosis not present

## 2015-07-26 DIAGNOSIS — O9989 Other specified diseases and conditions complicating pregnancy, childbirth and the puerperium: Secondary | ICD-10-CM

## 2015-07-26 DIAGNOSIS — O3680X0 Pregnancy with inconclusive fetal viability, not applicable or unspecified: Secondary | ICD-10-CM

## 2015-07-26 DIAGNOSIS — N760 Acute vaginitis: Secondary | ICD-10-CM | POA: Insufficient documentation

## 2015-07-26 DIAGNOSIS — R102 Pelvic and perineal pain: Secondary | ICD-10-CM | POA: Insufficient documentation

## 2015-07-26 DIAGNOSIS — O26891 Other specified pregnancy related conditions, first trimester: Secondary | ICD-10-CM | POA: Diagnosis not present

## 2015-07-26 DIAGNOSIS — B9689 Other specified bacterial agents as the cause of diseases classified elsewhere: Secondary | ICD-10-CM

## 2015-07-26 DIAGNOSIS — R1030 Lower abdominal pain, unspecified: Secondary | ICD-10-CM | POA: Diagnosis present

## 2015-07-26 LAB — CBC
HEMATOCRIT: 36.9 % (ref 36.0–46.0)
HEMOGLOBIN: 12.3 g/dL (ref 12.0–15.0)
MCH: 28.3 pg (ref 26.0–34.0)
MCHC: 33.3 g/dL (ref 30.0–36.0)
MCV: 85 fL (ref 78.0–100.0)
Platelets: 389 10*3/uL (ref 150–400)
RBC: 4.34 MIL/uL (ref 3.87–5.11)
RDW: 13.9 % (ref 11.5–15.5)
WBC: 8.6 10*3/uL (ref 4.0–10.5)

## 2015-07-26 LAB — URINALYSIS, ROUTINE W REFLEX MICROSCOPIC
BILIRUBIN URINE: NEGATIVE
GLUCOSE, UA: NEGATIVE mg/dL
Ketones, ur: NEGATIVE mg/dL
Leukocytes, UA: NEGATIVE
Nitrite: NEGATIVE
PH: 7.5 (ref 5.0–8.0)
Protein, ur: NEGATIVE mg/dL
SPECIFIC GRAVITY, URINE: 1.025 (ref 1.005–1.030)
Urobilinogen, UA: 1 mg/dL (ref 0.0–1.0)

## 2015-07-26 LAB — POCT PREGNANCY, URINE: Preg Test, Ur: POSITIVE — AB

## 2015-07-26 LAB — URINE MICROSCOPIC-ADD ON

## 2015-07-26 LAB — WET PREP, GENITAL
TRICH WET PREP: NONE SEEN
Yeast Wet Prep HPF POC: NONE SEEN

## 2015-07-26 LAB — HCG, QUANTITATIVE, PREGNANCY: HCG, BETA CHAIN, QUANT, S: 3338 m[IU]/mL — AB (ref <5)

## 2015-07-26 MED ORDER — METRONIDAZOLE 0.75 % VA GEL: 1.0000 | Freq: Every day | VAGINAL | Status: DC

## 2015-07-26 NOTE — MAU Note (Signed)
Pos HPT x 2, having L side (rib) pain, also lower abd cramping for the past 2 weeks.  Denies bleeding, has normal discharge.

## 2015-07-26 NOTE — Discharge Instructions (Signed)

## 2015-07-26 NOTE — MAU Provider Note (Signed)
Chief Complaint: Abdominal Pain   None     SUBJECTIVE HPI: Peggy Mason is a 19 y.o. G2P1001 at 8972w4d by LMP who presents to maternity admissions reporting LLQ abdominal pain that is intermittent and sharp, not occuring today while pt is in MAU.  She reports positive pregnancy test last night with LMP September 10, which she is sure about. She usually has regular periods.   She denies vaginal bleeding, vaginal itching/burning, urinary symptoms, h/a, dizziness, n/v, or fever/chills.     Abdominal Pain This is a new problem. The current episode started in the past 7 days. The onset quality is gradual. The problem occurs intermittently. The problem has been waxing and waning. The pain is located in the LLQ. The pain is moderate. The quality of the pain is sharp. The abdominal pain does not radiate. Pertinent negatives include no constipation, diarrhea, dysuria, fever, frequency, headaches, nausea or vomiting. The pain is relieved by nothing. She has tried nothing for the symptoms.    Past Medical History  Diagnosis Date  . Acne   . History of chlamydia infection   . Medical history non-contributory    Past Surgical History  Procedure Laterality Date  . Hernia repair      umbilical   Social History   Social History  . Marital Status: Single    Spouse Name: N/A  . Number of Children: N/A  . Years of Education: N/A   Occupational History  . Not on file.   Social History Main Topics  . Smoking status: Never Smoker   . Smokeless tobacco: Never Used  . Alcohol Use: No  . Drug Use: No  . Sexual Activity: Yes    Birth Control/ Protection: Condom, Implant   Other Topics Concern  . Not on file   Social History Narrative   No current facility-administered medications on file prior to encounter.   No current outpatient prescriptions on file prior to encounter.   Allergies  Allergen Reactions  . Amoxicillin Hives  . Penicillins Hives    Has patient had a PCN reaction  causing immediate rash, facial/tongue/throat swelling, SOB or lightheadedness with hypotension: Yes Has patient had a PCN reaction causing severe rash involving mucus membranes or skin necrosis: No Has patient had a PCN reaction that required hospitalization Yes Has patient had a PCN reaction occurring within the last 10 years: No If all of the above answers are "NO", then may proceed with Cephalosporin use.    ROS:  Review of Systems  Constitutional: Negative for fever, chills and fatigue.  HENT: Negative for sinus pressure.   Eyes: Negative for photophobia.  Respiratory: Negative for shortness of breath.   Cardiovascular: Negative for chest pain.  Gastrointestinal: Positive for abdominal pain. Negative for nausea, vomiting, diarrhea and constipation.  Genitourinary: Negative for dysuria, frequency, flank pain, vaginal bleeding, vaginal discharge, difficulty urinating, vaginal pain and pelvic pain.  Musculoskeletal: Negative for neck pain.  Neurological: Negative for dizziness, weakness and headaches.  Psychiatric/Behavioral: Negative.      I have reviewed patient's Past Medical Hx, Surgical Hx, Family Hx, Social Hx, medications and allergies.   Physical Exam   Patient Vitals for the past 24 hrs:  BP Temp Temp src Pulse Resp Height Weight  07/26/15 1129 125/71 mmHg 98.6 F (37 C) Oral 90 18 5\' 4"  (1.626 m) 89.177 kg (196 lb 9.6 oz)   Constitutional: Well-developed, well-nourished female in no acute distress.  Cardiovascular: normal rate Respiratory: normal effort GI: Abd soft, non-tender. Pos  BS x 4 MS: Extremities nontender, no edema, normal ROM Neurologic: Alert and oriented x 4.  GU: Neg CVAT.  PELVIC EXAM: Cervix pink, visually closed, without lesion, moderate amount frothy white discharge, vaginal walls and external genitalia normal Bimanual exam: Cervix 0/long/high, firm, anterior, neg CMT, uterus nontender, nonenlarged, adnexa without tenderness, enlargement, or  mass   LAB RESULTS Results for orders placed or performed during the hospital encounter of 07/26/15 (from the past 24 hour(s))  Urinalysis, Routine w reflex microscopic (not at [facility])     Status: Abnormal   Collection Time: 07/26/15 11:35 AM  Result Value Ref Range   Color, Urine YELLOW YELLOW   APPearance CLEAR CLEAR   Specific Gravity, Urine 1.025 1.005 - 1.030   pH 7.5 5.0 - 8.0   Glucose, UA NEGATIVE NEGATIVE mg/dL   Hgb urine dipstick TRACE (A) NEGATIVE   Bilirubin Urine NEGATIVE NEGATIVE   Ketones, ur NEGATIVE NEGATIVE mg/dL   Protein, ur NEGATIVE NEGATIVE mg/dL   Urobilinogen, UA 1.0 0.0 - 1.0 mg/dL   Nitrite NEGATIVE NEGATIVE   Leukocytes, UA NEGATIVE NEGATIVE  Urine microscopic-add on     Status: Abnormal   Collection Time: 07/26/15 11:35 AM  Result Value Ref Range   Squamous Epithelial / LPF FEW (A) RARE   WBC, UA 0-2 <3 WBC/hpf   RBC / HPF 3-6 <3 RBC/hpf   Urine-Other MUCOUS PRESENT   Pregnancy, urine POC     Status: Abnormal   Collection Time: 07/26/15 11:40 AM  Result Value Ref Range   Preg Test, Ur POSITIVE (A) NEGATIVE  CBC     Status: None   Collection Time: 07/26/15 12:15 PM  Result Value Ref Range   WBC 8.6 4.0 - 10.5 K/uL   RBC 4.34 3.87 - 5.11 MIL/uL   Hemoglobin 12.3 12.0 - 15.0 g/dL   HCT 16.1 09.6 - 04.5 %   MCV 85.0 78.0 - 100.0 fL   MCH 28.3 26.0 - 34.0 pg   MCHC 33.3 30.0 - 36.0 g/dL   RDW 40.9 81.1 - 91.4 %   Platelets 389 150 - 400 K/uL  hCG, quantitative, pregnancy     Status: Abnormal   Collection Time: 07/26/15 12:15 PM  Result Value Ref Range   hCG, Beta Chain, Quant, S 3338 (H) <5 mIU/mL  Wet prep, genital     Status: Abnormal   Collection Time: 07/26/15 12:15 PM  Result Value Ref Range   Yeast Wet Prep HPF POC NONE SEEN NONE SEEN   Trich, Wet Prep NONE SEEN NONE SEEN   Clue Cells Wet Prep HPF POC MODERATE (A) NONE SEEN   WBC, Wet Prep HPF POC FEW (A) NONE SEEN       IMAGING US Ob Comp Less 14 Wks  07/26/2015  CLINICAL DATA:   Patient with left-sided pain and lower abdominal pain/ cramping for the past 2 weeks. Positive pregnancy test. EXAM: OBSTETRIC <14 WK [place] AND TRANSVAGINAL OB US TECHNIQUE: Both transabdominal and transvaginal ultrasound examinations were performed for complete evaluation of the gestation as well as the maternal uterus, adnexal regions, and pelvic cul-de-sac. Transvaginal technique was performed to assess early pregnancy. COMPARISON:  None. FINDINGS: Intrauterine gestational sac: Present. Yolk sac:  Not present Embryo:  Not present Cardiac Activity: Not present MSD: 3.9  mm   5 w   1  d Maternal uterus/adnexae: Normal right and left ovaries. Corpus luteum within the right ovary. Small subchorionic hemorrhage. No free fluid in the pelvis. IMPRESSION: Probable early intrauterine  gestational sac, but no yolk sac, fetal pole, or cardiac activity yet visualized. Recommend follow-up quantitative B-HCG levels and follow-up US in 14 days to confirm and assess viability. This recommendation follows SRU consensus guidelines: Diagnostic Criteria for Nonviable Pregnancy Early in the First Trimester. Malva Limes Med 2013; 696:2952-84. Probable corpus luteum within the right ovary. Electronically Signed   By: Annia Belt M.D.   On: 07/26/2015 14:12   US Ob Transvaginal  07/26/2015  CLINICAL DATA:  Patient with left-sided pain and lower abdominal pain/ cramping for the past 2 weeks. Positive pregnancy test. EXAM: OBSTETRIC <14 WK [place] AND TRANSVAGINAL OB US TECHNIQUE: Both transabdominal and transvaginal ultrasound examinations were performed for complete evaluation of the gestation as well as the maternal uterus, adnexal regions, and pelvic cul-de-sac. Transvaginal technique was performed to assess early pregnancy. COMPARISON:  None. FINDINGS: Intrauterine gestational sac: Present. Yolk sac:  Not present Embryo:  Not present Cardiac Activity: Not present MSD: 3.9  mm   5 w   1  d Maternal uterus/adnexae: Normal right and left  ovaries. Corpus luteum within the right ovary. Small subchorionic hemorrhage. No free fluid in the pelvis. IMPRESSION: Probable early intrauterine gestational sac, but no yolk sac, fetal pole, or cardiac activity yet visualized. Recommend follow-up quantitative B-HCG levels and follow-up US in 14 days to confirm and assess viability. This recommendation follows SRU consensus guidelines: Diagnostic Criteria for Nonviable Pregnancy Early in the First Trimester. Malva Limes Med 2013; 132:4401-02. Probable corpus luteum within the right ovary. Electronically Signed   By: Annia Belt M.D.   On: 07/26/2015 14:12    MAU Management/MDM: Ordered labs and imaging and reviewed results.  Pt stable at time of discharge.  Findings today could represent a normal early pregnancy, spontaneous abortion or ectopic pregnancy which can be life-threatening.  Ectopic precautions were given to the patient with plan to return in 48 hours for repeat quant hcg to evaluate pregnancy development.  ASSESSMENT 1. Pregnancy of unknown anatomic location   2. Pelvic pain affecting pregnancy in first trimester, antepartum   3. BV (bacterial vaginosis)     PLAN Discharge home with ectopic precautions Metrogel Q HS x 5 nights Return to MAU in 48 hours for repeat quant hcg or sooner as needed for emergencies    Medication List    TAKE these medications        metroNIDAZOLE 0.75 % vaginal gel  Commonly known as:  METROGEL  Place 1 Applicatorful vaginally at bedtime. Apply one applicatorful to vagina at bedtime for 5 days       Follow-up Information    Follow up with [facility] [place] [facility].   Why:  In 48 hours for repeat labs or sooner as needed   Contact information:   [address] [id number] mc [place] [address] [phone]      Sharen Counter Certified Nurse-Midwife 07/26/2015  2:35 PM

## 2015-07-27 LAB — GC/CHLAMYDIA PROBE AMP (~~LOC~~) NOT AT ARMC
CHLAMYDIA, DNA PROBE: NEGATIVE
Neisseria Gonorrhea: NEGATIVE

## 2015-07-27 LAB — HIV ANTIBODY (ROUTINE TESTING W REFLEX): HIV SCREEN 4TH GENERATION: NONREACTIVE

## 2015-07-28 ENCOUNTER — Inpatient Hospital Stay (HOSPITAL_COMMUNITY)
Admission: AD | Admit: 2015-07-28 | Discharge: 2015-07-28 | Disposition: A | Discharge status: Home / Self Care | Payer: BLUE CROSS/BLUE SHIELD | Source: Ambulatory Visit | Attending: Obstetrics & Gynecology | Admitting: Obstetrics & Gynecology

## 2015-07-28 DIAGNOSIS — O0281 Inappropriate change in quantitative human chorionic gonadotropin (hCG) in early pregnancy: Secondary | ICD-10-CM | POA: Diagnosis not present

## 2015-07-28 DIAGNOSIS — O3680X0 Pregnancy with inconclusive fetal viability, not applicable or unspecified: Secondary | ICD-10-CM

## 2015-07-28 DIAGNOSIS — Z3A01 Less than 8 weeks gestation of pregnancy: Secondary | ICD-10-CM | POA: Insufficient documentation

## 2015-07-28 DIAGNOSIS — R109 Unspecified abdominal pain: Secondary | ICD-10-CM | POA: Diagnosis present

## 2015-07-28 DIAGNOSIS — O26891 Other specified pregnancy related conditions, first trimester: Secondary | ICD-10-CM | POA: Diagnosis not present

## 2015-07-28 LAB — HCG, QUANTITATIVE, PREGNANCY: hCG, Beta Chain, Quant, S: 7265 m[IU]/mL — ABNORMAL HIGH

## 2015-07-28 NOTE — Discharge Instructions (Signed)
First Trimester of Pregnancy The first trimester of pregnancy is from week 1 until the end of week 12 (months 1 through 3). A week after a sperm fertilizes an egg, the egg will implant on the wall of the uterus. This embryo will begin to develop into a baby. Genes from you and your partner are forming the baby. The female genes determine whether the baby is a boy or a girl. At 6-8 weeks, the eyes and face are formed, and the heartbeat can be seen on ultrasound. At the end of 12 weeks, all the baby's organs are formed.  Now that you are pregnant, you will want to do everything you can to have a healthy baby. Two of the most important things are to get good prenatal care and to follow your health care provider's instructions. Prenatal care is all the medical care you receive before the baby's birth. This care will help prevent, find, and treat any problems during the pregnancy and childbirth. BODY CHANGES Your body goes through many changes during pregnancy. The changes vary from woman to woman.   You may gain or lose a couple of pounds at first.  You may feel sick to your stomach (nauseous) and throw up (vomit). If the vomiting is uncontrollable, call your health care provider.  You may tire easily.  You may develop headaches that can be relieved by medicines approved by your health care provider.  You may urinate more often. Painful urination may mean you have a bladder infection.  You may develop heartburn as a result of your pregnancy.  You may develop constipation because certain hormones are causing the muscles that push waste through your intestines to slow down.  You may develop hemorrhoids or swollen, bulging veins (varicose veins).  Your breasts may begin to grow larger and become tender. Your nipples may stick out more, and the tissue that surrounds them (areola) may become darker.  Your gums may bleed and may be sensitive to brushing and flossing.  Dark spots or blotches (chloasma,  mask of pregnancy) may develop on your face. This will likely fade after the baby is born.  Your menstrual periods will stop.  You may have a loss of appetite.  You may develop cravings for certain kinds of food.  You may have changes in your emotions from day to day, such as being excited to be pregnant or being concerned that something may go wrong with the pregnancy and baby.  You may have more vivid and strange dreams.  You may have changes in your hair. These can include thickening of your hair, rapid growth, and changes in texture. Some women also have hair loss during or after pregnancy, or hair that feels dry or thin. Your hair will most likely return to normal after your baby is born. WHAT TO EXPECT AT YOUR PRENATAL VISITS During a routine prenatal visit:  You will be weighed to make sure you and the baby are growing normally.  Your blood pressure will be taken.  Your abdomen will be measured to track your baby's growth.  The fetal heartbeat will be listened to starting around week 10 or 12 of your pregnancy.  Test results from any previous visits will be discussed. Your health care provider may ask you:  How you are feeling.  If you are feeling the baby move.  If you have had any abnormal symptoms, such as leaking fluid, bleeding, severe headaches, or abdominal cramping.  If you are using any tobacco products,   including cigarettes, chewing tobacco, and electronic cigarettes.  If you have any questions. Other tests that may be performed during your first trimester include:  Blood tests to find your blood type and to check for the presence of any previous infections. They will also be used to check for low iron levels (anemia) and Rh antibodies. Later in the pregnancy, blood tests for diabetes will be done along with other tests if problems develop.  Urine tests to check for infections, diabetes, or protein in the urine.  An ultrasound to confirm the proper growth  and development of the baby.  An amniocentesis to check for possible genetic problems.  Fetal screens for spina bifida and Down syndrome.  You may need other tests to make sure you and the baby are doing well.  HIV (human immunodeficiency virus) testing. Routine prenatal testing includes screening for HIV, unless you choose not to have this test. HOME CARE INSTRUCTIONS  Medicines  Follow your health care provider's instructions regarding medicine use. Specific medicines may be either safe or unsafe to take during pregnancy.  Take your prenatal vitamins as directed.  If you develop constipation, try taking a stool softener if your health care provider approves. Diet  Eat regular, well-balanced meals. Choose a variety of foods, such as meat or vegetable-based protein, fish, milk and low-fat dairy products, vegetables, fruits, and whole grain breads and cereals. Your health care provider will help you determine the amount of weight gain that is right for you.  Avoid raw meat and uncooked cheese. These carry germs that can cause birth defects in the baby.  Eating four or five small meals rather than three large meals a day may help relieve nausea and vomiting. If you start to feel nauseous, eating a few soda crackers can be helpful. Drinking liquids between meals instead of during meals also seems to help nausea and vomiting.  If you develop constipation, eat more high-fiber foods, such as fresh vegetables or fruit and whole grains. Drink enough fluids to keep your urine clear or pale yellow. Activity and Exercise  Exercise only as directed by your health care provider. Exercising will help you:  Control your weight.  Stay in shape.  Be prepared for labor and delivery.  Experiencing pain or cramping in the lower abdomen or low back is a good sign that you should stop exercising. Check with your health care provider before continuing normal exercises.  Try to avoid standing for long  periods of time. Move your legs often if you must stand in one place for a long time.  Avoid heavy lifting.  Wear low-heeled shoes, and practice good posture.  You may continue to have sex unless your health care provider directs you otherwise. Relief of Pain or Discomfort  Wear a good support bra for breast tenderness.   Take warm sitz baths to soothe any pain or discomfort caused by hemorrhoids. Use hemorrhoid cream if your health care provider approves.   Rest with your legs elevated if you have leg cramps or low back pain.  If you develop varicose veins in your legs, wear support hose. Elevate your feet for 15 minutes, 3-4 times a day. Limit salt in your diet. Prenatal Care  Schedule your prenatal visits by the twelfth week of pregnancy. They are usually scheduled monthly at first, then more often in the last 2 months before delivery.  Write down your questions. Take them to your prenatal visits.  Keep all your prenatal visits as directed by your   health care provider. Safety  Wear your seat belt at all times when driving.  Make a list of emergency phone numbers, including numbers for family, friends, the hospital, and police and fire departments. General Tips  Ask your health care provider for a referral to a local prenatal education class. Begin classes no later than at the beginning of month 6 of your pregnancy.  Ask for help if you have counseling or nutritional needs during pregnancy. Your health care provider can offer advice or refer you to specialists for help with various needs.  Do not use hot tubs, steam rooms, or saunas.  Do not douche or use tampons or scented sanitary pads.  Do not cross your legs for long periods of time.  Avoid cat litter boxes and soil used by cats. These carry germs that can cause birth defects in the baby and possibly loss of the fetus by miscarriage or stillbirth.  Avoid all smoking, herbs, alcohol, and medicines not prescribed by  your health care provider. Chemicals in these affect the formation and growth of the baby.  Do not use any tobacco products, including cigarettes, chewing tobacco, and electronic cigarettes. If you need help quitting, ask your health care provider. You may receive counseling support and other resources to help you quit.  Schedule a dentist appointment. At home, brush your teeth with a soft toothbrush and be gentle when you floss. SEEK MEDICAL CARE IF:   You have dizziness.  You have mild pelvic cramps, pelvic pressure, or nagging pain in the abdominal area.  You have persistent nausea, vomiting, or diarrhea.  You have a bad smelling vaginal discharge.  You have pain with urination.  You notice increased swelling in your face, hands, legs, or ankles. SEEK IMMEDIATE MEDICAL CARE IF:   You have a fever.  You are leaking fluid from your vagina.  You have spotting or bleeding from your vagina.  You have severe abdominal cramping or pain.  You have rapid weight gain or loss.  You vomit blood or material that looks like coffee grounds.  You are exposed to German measles and have never had them.  You are exposed to fifth disease or chickenpox.  You develop a severe headache.  You have shortness of breath.  You have any kind of trauma, such as from a fall or a car accident.   This information is not intended to replace advice given to you by your health care provider. Make sure you discuss any questions you have with your health care provider.   Document Released: 09/24/2001 Document Revised: 10/21/2014 Document Reviewed: 08/10/2013 Elsevier Interactive Patient Education 2016 [employer]vier [employer]  

## 2015-07-28 NOTE — MAU Note (Signed)
Doing ok. Had some cramping this morning, episode of diarrhea, no longer cramping.  No bleeding.

## 2015-07-28 NOTE — MAU Provider Note (Signed)
History   [id number]   Chief Complaint  Patient presents with  . Follow-up    HPI Peggy Mason is a [age] y.o. female G2P1001 here for follow-up BHCG.  Upon review of the records patient was first seen on 10/12 for abdominal pain.   BHCG on that day was 3338.  Ultrasound showed probable early intrauterine gestational sac.  GC/CT and wet prep were collected.  Results were negatvie.   Pt discharged home.   Pt here today with no report of abdominal pain or vaginal bleeding.   All other systems negative.    Patient's last menstrual period was 06/24/2015.  OB History  Gravida Para Term Preterm AB SAB TAB Ectopic Multiple Living  2 1 1  0 0 0 0 0 0 1    # Outcome Date GA Lbr Len/2nd Weight Sex Delivery Anes PTL Lv  2 Current           1 Term 09/02/12 7243w1d 14:03 / 01:34 7 lb 9.7 oz (3.45 kg) F Vag-Spont EPI  Y      Past Medical History  Diagnosis Date  . Acne   . History of chlamydia infection   . Medical history non-contributory     Family History  Problem Relation Age of Onset  . Asthma Sister   . Learning disabilities Sister   . Kidney disease Sister     dialysis    Social History   Social History  . Marital Status: Single    Spouse Name: N/A  . Number of Children: N/A  . Years of Education: N/A   Social History Main Topics  . Smoking status: Never Smoker   . Smokeless tobacco: Never Used  . Alcohol Use: No  . Drug Use: No  . Sexual Activity: Yes    Birth Control/ Protection: Condom, Implant   Other Topics Concern  . Not on file   Social History Narrative    Allergies  Allergen Reactions  . Amoxicillin Hives  . Penicillins Hives    Has patient had a PCN reaction causing immediate rash, facial/tongue/throat swelling, SOB or lightheadedness with hypotension: Yes Has patient had a PCN reaction causing severe rash involving mucus membranes or skin necrosis: No Has patient had a PCN reaction that required hospitalization Yes Has patient had a PCN reaction  occurring within the last 10 years: No If all of the above answers are "NO", then may proceed with Cephalosporin use.    No current facility-administered medications on file prior to encounter.   Current Outpatient Prescriptions on File Prior to Encounter  Medication Sig Dispense Refill  . metroNIDAZOLE (METROGEL) 0.75 % vaginal gel Place 1 Applicatorful vaginally at bedtime. Apply one applicatorful to vagina at bedtime for 5 days 70 g 1     Physical Exam   Filed Vitals:   07/28/15 1254  BP: 117/75  Pulse: 83  Temp: 98.3 F (36.8 C)  TempSrc: Oral  Resp: 18    Physical Exam  Constitutional: She is oriented to person, place, and time. She appears well-developed and well-nourished. No distress.  HENT:  Head: Normocephalic and atraumatic.  Cardiovascular: Normal rate.   Respiratory: Effort normal. No respiratory distress.  Neurological: She is alert and oriented to person, place, and time.  Skin: She is not diaphoretic.  Psychiatric: She has a normal mood and affect. Her behavior is normal. Judgment and thought content normal.    MAU Course  Procedures Component     Latest Ref Rng 07/26/2015 07/28/2015  HCG, Beta  Chain, Quant, S     <5 mIU/mL 3338 (H) 7265 (H)     MDM Appropriate rise in BHCG No abdominal pain or vaginal bleeding.  Will order outpatient ultrasound for viability.   Assessment and Plan  19 y.o. G2P1001 at [redacted]w[redacted]d wks Pregnancy Follow-up BHCG Pregnancy of Unknown Location  P: Discharge home Ectopic & SAB precautions reviewed F/u outpatient ultrasound in 1 week  Judeth Horn, NP 07/28/2015 12:42 PM

## 2015-08-04 ENCOUNTER — Ambulatory Visit (HOSPITAL_COMMUNITY)
Admission: RE | Admit: 2015-08-04 | Discharge: 2015-08-04 | Disposition: A | Discharge status: Home / Self Care | Payer: BLUE CROSS/BLUE SHIELD | Source: Ambulatory Visit | Attending: Student | Admitting: Student

## 2015-08-04 ENCOUNTER — Inpatient Hospital Stay (HOSPITAL_COMMUNITY)
Admission: AD | Admit: 2015-08-04 | Discharge: 2015-08-04 | Disposition: A | Discharge status: Home / Self Care | Payer: BLUE CROSS/BLUE SHIELD | Source: Ambulatory Visit | Attending: Obstetrics and Gynecology | Admitting: Obstetrics and Gynecology

## 2015-08-04 DIAGNOSIS — Z3A01 Less than 8 weeks gestation of pregnancy: Secondary | ICD-10-CM | POA: Diagnosis not present

## 2015-08-04 DIAGNOSIS — O26891 Other specified pregnancy related conditions, first trimester: Secondary | ICD-10-CM | POA: Diagnosis not present

## 2015-08-04 DIAGNOSIS — R102 Pelvic and perineal pain: Secondary | ICD-10-CM | POA: Diagnosis present

## 2015-08-04 DIAGNOSIS — O9989 Other specified diseases and conditions complicating pregnancy, childbirth and the puerperium: Secondary | ICD-10-CM | POA: Diagnosis not present

## 2015-08-04 DIAGNOSIS — R109 Unspecified abdominal pain: Secondary | ICD-10-CM

## 2015-08-04 DIAGNOSIS — O208 Other hemorrhage in early pregnancy: Secondary | ICD-10-CM | POA: Insufficient documentation

## 2015-08-04 DIAGNOSIS — Z3491 Encounter for supervision of normal pregnancy, unspecified, first trimester: Secondary | ICD-10-CM

## 2015-08-04 DIAGNOSIS — O3680X0 Pregnancy with inconclusive fetal viability, not applicable or unspecified: Secondary | ICD-10-CM

## 2015-08-04 DIAGNOSIS — O26899 Other specified pregnancy related conditions, unspecified trimester: Secondary | ICD-10-CM

## 2015-08-04 NOTE — MAU Provider Note (Signed)
S: 19 y.o. G2P1001 @[redacted]w[redacted]d  by LMP/U/S presents to MAU for follow up results after scheduled outpatient ultrasound. She denies pain or bleeding today.   She initially presented in 07/26/15 with abdominal pain in pregnancy and had quant hcg of 3328.  On 07/28/15 she had quant hcg of 7265.  With appropriate rise in Hcg, outpatient US ordered.     O:  LMP 06/24/2015  VS reviewed, nursing note reviewed,  Constitutional: well developed, well nourished, no distress HEENT: normocephalic CV: normal rate Pulm/chest wall: normal effort Abdomen: soft Neuro: alert and oriented x 3 Skin: warm, dry Psych: affect normal  [place] Ob Transvaginal  08/04/2015  CLINICAL DATA:  Pregnancy of unknown anatomic location. Pelvic pain and cramping for several weeks. Gestational age by LMP of 5 weeks 6 days. EXAM: TRANSVAGINAL OB ULTRASOUND TECHNIQUE: Transvaginal ultrasound was performed for complete evaluation of the gestation as well as the maternal uterus, adnexal regions, and pelvic cul-de-sac. COMPARISON:  07/26/2015 FINDINGS: Intrauterine gestational sac: Visualized/normal in shape. Yolk sac:  Visualized Embryo:  Visualized Cardiac Activity: Visualized Heart Rate: 124 bpm CRL:   3  mm   5 w 6 d                  US EDC: 03/30/2016 Maternal uterus/adnexae: Moderate subchorionic hemorrhage noted. Both ovaries are normal in appearance. Small right ovarian corpus luteum noted. No adnexal mass identified. No evidence of free fluid. IMPRESSION: Single living IUP measuring 5 weeks 6 days with US EDC of 03/30/2016. This is concordant with LMP. Moderate subchorionic hemorrhage. Electronically Signed   By: Myles RosenthalJohn  Stahl M.D.   On: 08/04/2015 14:33     Lab Results  Component Value Date   HCGBETAQNT 7265* 07/28/2015   HCGBETAQNT 3338* 07/26/2015   A: Pregnancy- intrauterine.  Abdominal pain in pregnancy, resolved.   P: D/C home Abdominal pain -discussed return precautions Reviewed SAB precautions and risk within the first 12  weeks of pregnancy. Reviewed that likely cause of bleeding is the seen subchorionic hemorrhage.  Follow up with pregnancy care provider - [facility]  Federico FlakeKimberly Niles Newton, MD 3:44 PM

## 2015-08-14 ENCOUNTER — Inpatient Hospital Stay (HOSPITAL_COMMUNITY)
Admission: AD | Admit: 2015-08-14 | Discharge: 2015-08-14 | Disposition: A | Discharge status: Home / Self Care | Payer: Medicaid Other | Source: Ambulatory Visit | Attending: Obstetrics and Gynecology | Admitting: Obstetrics and Gynecology

## 2015-08-14 ENCOUNTER — Encounter (HOSPITAL_COMMUNITY): Payer: Self-pay | Admitting: *Deleted

## 2015-08-14 DIAGNOSIS — R109 Unspecified abdominal pain: Secondary | ICD-10-CM | POA: Insufficient documentation

## 2015-08-14 DIAGNOSIS — O26891 Other specified pregnancy related conditions, first trimester: Secondary | ICD-10-CM | POA: Diagnosis not present

## 2015-08-14 DIAGNOSIS — O26899 Other specified pregnancy related conditions, unspecified trimester: Secondary | ICD-10-CM

## 2015-08-14 DIAGNOSIS — Z6791 Unspecified blood type, Rh negative: Secondary | ICD-10-CM | POA: Diagnosis not present

## 2015-08-14 DIAGNOSIS — Z3A01 Less than 8 weeks gestation of pregnancy: Secondary | ICD-10-CM | POA: Insufficient documentation

## 2015-08-14 DIAGNOSIS — Z283 Underimmunization status: Secondary | ICD-10-CM

## 2015-08-14 DIAGNOSIS — O210 Mild hyperemesis gravidarum: Secondary | ICD-10-CM | POA: Diagnosis present

## 2015-08-14 DIAGNOSIS — O09899 Supervision of other high risk pregnancies, unspecified trimester: Secondary | ICD-10-CM

## 2015-08-14 DIAGNOSIS — O9989 Other specified diseases and conditions complicating pregnancy, childbirth and the puerperium: Secondary | ICD-10-CM

## 2015-08-14 DIAGNOSIS — Z2839 Other underimmunization status: Secondary | ICD-10-CM

## 2015-08-14 DIAGNOSIS — Z880 Allergy status to penicillin: Secondary | ICD-10-CM | POA: Insufficient documentation

## 2015-08-14 DIAGNOSIS — R112 Nausea with vomiting, unspecified: Secondary | ICD-10-CM

## 2015-08-14 LAB — COMPREHENSIVE METABOLIC PANEL
ALK PHOS: 90 U/L (ref 38–126)
ALT: 88 U/L — AB (ref 14–54)
ANION GAP: 5 (ref 5–15)
AST: 45 U/L — ABNORMAL HIGH (ref 15–41)
Albumin: 3.6 g/dL (ref 3.5–5.0)
BILIRUBIN TOTAL: 0.3 mg/dL (ref 0.3–1.2)
BUN: 9 mg/dL (ref 6–20)
CALCIUM: 9.2 mg/dL (ref 8.9–10.3)
CO2: 22 mmol/L (ref 22–32)
CREATININE: 0.54 mg/dL (ref 0.44–1.00)
Chloride: 107 mmol/L (ref 101–111)
GFR calc non Af Amer: >60 mL/min (ref >60)
Glucose, Bld: 81 mg/dL (ref 65–99)
Potassium: 4.3 mmol/L (ref 3.5–5.1)
Sodium: 134 mmol/L — ABNORMAL LOW (ref 135–145)
TOTAL PROTEIN: 7.4 g/dL (ref 6.5–8.1)

## 2015-08-14 LAB — CBC
HEMATOCRIT: 35.1 % — AB (ref 36.0–46.0)
HEMOGLOBIN: 12 g/dL (ref 12.0–15.0)
MCH: 28.2 pg (ref 26.0–34.0)
MCHC: 34.2 g/dL (ref 30.0–36.0)
MCV: 82.6 fL (ref 78.0–100.0)
Platelets: 387 10*3/uL (ref 150–400)
RBC: 4.25 MIL/uL (ref 3.87–5.11)
RDW: 14 % (ref 11.5–15.5)
WBC: 11.2 10*3/uL — ABNORMAL HIGH (ref 4.0–10.5)

## 2015-08-14 LAB — URINE MICROSCOPIC-ADD ON

## 2015-08-14 LAB — URINALYSIS, ROUTINE W REFLEX MICROSCOPIC
BILIRUBIN URINE: NEGATIVE
GLUCOSE, UA: NEGATIVE mg/dL
KETONES UR: 15 mg/dL — AB
Leukocytes, UA: NEGATIVE
Nitrite: NEGATIVE
PROTEIN: NEGATIVE mg/dL
Specific Gravity, Urine: >1.03 — ABNORMAL HIGH (ref 1.005–1.030)
UROBILINOGEN UA: 1 mg/dL (ref 0.0–1.0)
pH: 6 (ref 5.0–8.0)

## 2015-08-14 LAB — LIPASE, BLOOD: LIPASE: 29 U/L (ref 11–51)

## 2015-08-14 LAB — AMYLASE: Amylase: 67 U/L (ref 28–100)

## 2015-08-14 MED ORDER — ONDANSETRON HCL 4 MG/2ML IJ SOLN
4.0000 mg | Freq: Once | INTRAMUSCULAR | Status: AC
  Administered 2015-08-14: 4 mg via INTRAVENOUS
  Filled 2015-08-14: qty 2

## 2015-08-14 MED ORDER — LACTATED RINGERS IV BOLUS (SEPSIS)
150.0000 mL | INTRAVENOUS | Status: DC
Start: 2015-08-14 — End: 2015-08-15

## 2015-08-14 MED ORDER — LACTATED RINGERS IV BOLUS (SEPSIS)
1000.0000 mL | Freq: Once | INTRAVENOUS | Status: AC
  Administered 2015-08-14: 1000 mL via INTRAVENOUS

## 2015-08-14 MED ORDER — ONDANSETRON 4 MG PO TBDP: 4.0000 mg | ORAL_TABLET | Freq: Three times a day (TID) | ORAL | Status: DC | PRN

## 2015-08-14 NOTE — MAU Note (Signed)
Pt states that she is having constant nausea, decreased appetite. Has snacked on candy and has kept that down, but cannot keep any food or fluids down. Having some lower abdominal pain and cramping. Called [facility] and was told to come in. Denies vag bleeding.

## 2015-08-14 NOTE — Discharge Instructions (Signed)

## 2015-08-14 NOTE — MAU Provider Note (Signed)
History  19 yo G2P1001 @ 7.2 wks by certain LMP presents to MAU w/ c/o N/V since this past Friday. No relief w/ Diclegis, but "half way better" than when she called the office earlier. Here because she was instructed to come to MAU for IVFs. Also reports lower abdominal cramping. Denies urinary sxs, constipation, diarrhea, bleeding or change in vaginal discharge.  Blood type O neg. Received Rhophylac on 08/04/15 due to spotting.  Treated for BV and yeast on 08/03/15.  Patient Active Problem List   Diagnosis Date Noted  . Nausea & vomiting 08/14/2015  . Rh negative, maternal - received Rhophylac on 08/04/15 due to spotting 08/14/2015  . Rubella non-immune status, antepartum 08/14/2015  . Allergy to penicillin - rash, hives -- required hospitalization 08/14/2015  . Allergy to amoxicillin -- rash, hives 08/14/2015  . Obesity 09/23/2013  . ACNE, MILD 04/27/2008    Chief Complaint  Patient presents with  . Abdominal Pain  . Morning Sickness   HPI As above OB History    Gravida Para Term Preterm AB TAB SAB Ectopic Multiple Living   2 1 1  0 0 0 0 0 0 1      Past Medical History  Diagnosis Date  . Acne   . History of chlamydia infection   . Medical history non-contributory     Past Surgical History  Procedure Laterality Date  . Hernia repair      umbilical    Family History  Problem Relation Age of Onset  . Asthma Sister   . Learning disabilities Sister   . Kidney disease Sister     dialysis    Social History  Substance Use Topics  . Smoking status: Never Smoker   . Smokeless tobacco: Never Used  . Alcohol Use: No    Allergies:  Allergies  Allergen Reactions  . Amoxicillin Hives  . Penicillins Hives    Has patient had a PCN reaction causing immediate rash, facial/tongue/throat swelling, SOB or lightheadedness with hypotension: Yes Has patient had a PCN reaction causing severe rash involving mucus membranes or skin necrosis: No Has patient had a PCN reaction  that required hospitalization Yes Has patient had a PCN reaction occurring within the last 10 years: No If all of the above answers are "NO", then may proceed with Cephalosporin use.    Prescriptions prior to admission  Medication Sig Dispense Refill Last Dose  . Doxylamine-Pyridoxine (DICLEGIS) 10-10 MG TBEC Take 1-2 tablets by mouth daily as needed (nausea).   Past Week at Unknown time  . prenatal vitamin w/FE, FA (PRENATAL 1 + 1) 27-1 MG TABS tablet Take 1 tablet by mouth daily at 12 noon.   Past Week at Unknown time  . terconazole (TERAZOL 3) 0.8 % vaginal cream Place 1 applicator vaginally at bedtime.   Past Week at Unknown time  . metroNIDAZOLE (METROGEL) 0.75 % vaginal gel Place 1 Applicatorful vaginally at bedtime. Apply one applicatorful to vagina at bedtime for 5 days (Patient not taking: Reported on 08/14/2015) 70 g 1 Not Taking at Unknown time    ROS  N&V - bleeding  Physical Exam   Blood pressure 118/71, pulse 76, resp. rate 20, height 5\' 4"  (1.626 m), weight 88.996 kg (196 lb 3.2 oz), last menstrual period 06/24/2015, SpO2 97 %. Results for orders placed or performed during the hospital encounter of 08/14/15 (from the past 24 hour(s))  Urinalysis, Routine w reflex microscopic ([facility])     Status: Abnormal   Collection Time:  08/14/15  8:54 PM  Result Value Ref Range   Color, Urine YELLOW YELLOW   APPearance CLEAR CLEAR   Specific Gravity, Urine >1.030 (H) 1.005 - 1.030   pH 6.0 5.0 - 8.0   Glucose, UA NEGATIVE NEGATIVE mg/dL   Hgb urine dipstick MODERATE (A) NEGATIVE   Bilirubin Urine NEGATIVE NEGATIVE   Ketones, ur 15 (A) NEGATIVE mg/dL   Protein, ur NEGATIVE NEGATIVE mg/dL   Urobilinogen, UA 1.0 0.0 - 1.0 mg/dL   Nitrite NEGATIVE NEGATIVE   Leukocytes, UA NEGATIVE NEGATIVE  Urine microscopic-add on     Status: Abnormal   Collection Time: 08/14/15  8:54 PM  Result Value Ref Range   Squamous Epithelial / LPF MANY (A) RARE   WBC, UA 0-2 <3 WBC/hpf   RBC / HPF  7-10 <3 RBC/hpf   Bacteria, UA FEW (A) RARE   Urine-Other MUCOUS PRESENT   CBC     Status: Abnormal   Collection Time: 08/14/15 10:10 PM  Result Value Ref Range   WBC 11.2 (H) 4.0 - 10.5 K/uL   RBC 4.25 3.87 - 5.11 MIL/uL   Hemoglobin 12.0 12.0 - 15.0 g/dL   HCT 16.1 (L) 09.6 - 04.5 %   MCV 82.6 78.0 - 100.0 fL   MCH 28.2 26.0 - 34.0 pg   MCHC 34.2 30.0 - 36.0 g/dL   RDW 40.9 81.1 - 91.4 %   Platelets 387 150 - 400 K/uL  Comprehensive metabolic panel     Status: Abnormal   Collection Time: 08/14/15 10:10 PM  Result Value Ref Range   Sodium 134 (L) 135 - 145 mmol/L   Potassium 4.3 3.5 - 5.1 mmol/L   Chloride 107 101 - 111 mmol/L   CO2 22 22 - 32 mmol/L   Glucose, Bld 81 65 - 99 mg/dL   BUN 9 6 - 20 mg/dL   Creatinine, Ser 7.82 0.44 - 1.00 mg/dL   Calcium 9.2 8.9 - 95.6 mg/dL   Total Protein 7.4 6.5 - 8.1 g/dL   Albumin 3.6 3.5 - 5.0 g/dL   AST 45 (H) 15 - 41 U/L   ALT 88 (H) 14 - 54 U/L   Alkaline Phosphatase 90 38 - 126 U/L   Total Bilirubin 0.3 0.3 - 1.2 mg/dL   GFR calc non Af Amer >60 >60 mL/min   GFR calc Af Amer >60 >60 mL/min   Anion gap 5 5 - 15     Physical Exam Gen: NAD Lungs: CTAB CV: RRR w/o M/R/G Abdomen: soft, no guarding or rebound, NTTP Pelvic: Deferred ED Course  UA Urine culture IVFs Zofran CBC CMP Amylase Lipase Assessment: N/V of pregnancy @ 7+ wks. Improved after IV hydration and Zofran.  Mildly elevated AST/ALT likely secondary to vomiting.  Abdominal pain likely due to a combination of early pregnancy and vomiting. Common discomforts of pregnancy for this GA reviewed.  Plan: Script for Zofran Strict SAB precautions Office f/u prn   Sherre Scarlet CNM, MS 08/14/2015 11:15 PM

## 2015-08-15 ENCOUNTER — Observation Stay (HOSPITAL_COMMUNITY)
Admission: AD | Admit: 2015-08-15 | Discharge: 2015-08-16 | Disposition: A | Discharge status: Home / Self Care | Payer: BLUE CROSS/BLUE SHIELD | Source: Ambulatory Visit | Attending: Obstetrics and Gynecology | Admitting: Obstetrics and Gynecology

## 2015-08-15 ENCOUNTER — Inpatient Hospital Stay (HOSPITAL_COMMUNITY): Payer: BLUE CROSS/BLUE SHIELD

## 2015-08-15 DIAGNOSIS — O468X9 Other antepartum hemorrhage, unspecified trimester: Secondary | ICD-10-CM | POA: Diagnosis present

## 2015-08-15 DIAGNOSIS — Z789 Other specified health status: Secondary | ICD-10-CM

## 2015-08-15 DIAGNOSIS — O9989 Other specified diseases and conditions complicating pregnancy, childbirth and the puerperium: Principal | ICD-10-CM | POA: Insufficient documentation

## 2015-08-15 DIAGNOSIS — O26899 Other specified pregnancy related conditions, unspecified trimester: Secondary | ICD-10-CM

## 2015-08-15 DIAGNOSIS — E569 Vitamin deficiency, unspecified: Secondary | ICD-10-CM | POA: Diagnosis present

## 2015-08-15 DIAGNOSIS — Z3A08 8 weeks gestation of pregnancy: Secondary | ICD-10-CM | POA: Insufficient documentation

## 2015-08-15 DIAGNOSIS — O210 Mild hyperemesis gravidarum: Secondary | ICD-10-CM | POA: Diagnosis present

## 2015-08-15 DIAGNOSIS — O219 Vomiting of pregnancy, unspecified: Secondary | ICD-10-CM | POA: Insufficient documentation

## 2015-08-15 DIAGNOSIS — O418X90 Other specified disorders of amniotic fluid and membranes, unspecified trimester, not applicable or unspecified: Secondary | ICD-10-CM | POA: Diagnosis present

## 2015-08-15 DIAGNOSIS — R109 Unspecified abdominal pain: Secondary | ICD-10-CM

## 2015-08-15 LAB — URINE MICROSCOPIC-ADD ON

## 2015-08-15 LAB — URINALYSIS, ROUTINE W REFLEX MICROSCOPIC
Glucose, UA: NEGATIVE mg/dL
LEUKOCYTES UA: NEGATIVE
NITRITE: NEGATIVE
PROTEIN: NEGATIVE mg/dL
Specific Gravity, Urine: >1.03 — ABNORMAL HIGH (ref 1.005–1.030)
UROBILINOGEN UA: 4 mg/dL — AB (ref 0.0–1.0)
pH: 6 (ref 5.0–8.0)

## 2015-08-15 LAB — COMPREHENSIVE METABOLIC PANEL
ALBUMIN: 3.7 g/dL (ref 3.5–5.0)
ALT: 75 U/L — ABNORMAL HIGH (ref 14–54)
ANION GAP: 5 (ref 5–15)
AST: 36 U/L (ref 15–41)
Alkaline Phosphatase: 90 U/L (ref 38–126)
BILIRUBIN TOTAL: 0.5 mg/dL (ref 0.3–1.2)
BUN: 7 mg/dL (ref 6–20)
CHLORIDE: 105 mmol/L (ref 101–111)
CO2: 22 mmol/L (ref 22–32)
Calcium: 9.1 mg/dL (ref 8.9–10.3)
Creatinine, Ser: 0.55 mg/dL (ref 0.44–1.00)
GFR calc Af Amer: >60 mL/min (ref >60)
GFR calc non Af Amer: >60 mL/min (ref >60)
GLUCOSE: 92 mg/dL (ref 65–99)
POTASSIUM: 4.4 mmol/L (ref 3.5–5.1)
SODIUM: 132 mmol/L — AB (ref 135–145)
TOTAL PROTEIN: 7.6 g/dL (ref 6.5–8.1)

## 2015-08-15 MED ORDER — M.V.I. ADULT IV INJ
Freq: Once | INTRAVENOUS | Status: AC
  Administered 2015-08-15: 23:00:00 via INTRAVENOUS
  Filled 2015-08-15: qty 10

## 2015-08-15 MED ORDER — LACTATED RINGERS IV BOLUS (SEPSIS)
500.0000 mL | Freq: Once | INTRAVENOUS | Status: AC
  Administered 2015-08-15: 500 mL via INTRAVENOUS

## 2015-08-15 MED ORDER — ONDANSETRON HCL 4 MG/2ML IJ SOLN
4.0000 mg | Freq: Four times a day (QID) | INTRAMUSCULAR | Status: DC
  Administered 2015-08-15 – 2015-08-16 (×2): 4 mg via INTRAVENOUS
  Filled 2015-08-15 (×2): qty 2

## 2015-08-15 MED ORDER — PROMETHAZINE HCL 25 MG/ML IJ SOLN
25.0000 mg | Freq: Four times a day (QID) | INTRAMUSCULAR | Status: DC
Start: 2015-08-15 — End: 2015-08-16
  Administered 2015-08-15 – 2015-08-16 (×2): 25 mg via INTRAVENOUS
  Filled 2015-08-15 (×2): qty 1

## 2015-08-15 MED ORDER — ZOLPIDEM TARTRATE 5 MG PO TABS: 5.0000 mg | ORAL_TABLET | Freq: Every evening | ORAL | Status: DC | PRN

## 2015-08-15 MED ORDER — ONDANSETRON HCL 4 MG/2ML IJ SOLN
4.0000 mg | Freq: Once | INTRAMUSCULAR | Status: AC
  Administered 2015-08-15: 4 mg via INTRAVENOUS
  Filled 2015-08-15: qty 2

## 2015-08-15 MED ORDER — LACTATED RINGERS IV SOLN
INTRAVENOUS | Status: DC
Start: 2015-08-15 — End: 2015-08-16
  Administered 2015-08-15: 250 mL/h via INTRAVENOUS
  Administered 2015-08-16 (×2): via INTRAVENOUS

## 2015-08-15 NOTE — MAU Note (Signed)
Patients states she is having throbbing pain to lower abdomen, no diarrhea, occ vomiting. Denies any fever or bleeding.This started on Friday of last week.

## 2015-08-15 NOTE — H&P (Signed)
Peggy Mason is a 19 y.o. female, G2P1001 at 427 3/7 weeks, presented unannounced to MAU with N/V today, lower abdominal pain, and severe constipation.  Seen 10/31 in MAU for N/V and cramping--received IV hydration and Zofran.  Home to continue Zofran.  Now reporting no bowel movements for at least 3-5 days, "maybe longer".  Hx of SCH noted on US at 5 6/7 weeks in MAU, denies bleeding.  Received IV hydration in MAU, with Zofran IV.  Had soap suds enema with good results.  US showed viable 8 week pregnancy, [facility].  Still feeling nauseated, with minimal po fluid intake.  Still having results from SSE.  Patient Active Problem List   Diagnosis Date Noted  . Hyperemesis affecting pregnancy, antepartum 08/15/2015  . Subchorionic hemorrhage 08/15/2015  . Vitamin D deficiency 08/15/2015  . Rh negative, maternal - received Rhophylac on 08/04/15 due to spotting 08/14/2015  . Rubella non-immune status, antepartum 08/14/2015  . Allergy to penicillin - rash, hives -- required hospitalization 08/14/2015  . Allergy to amoxicillin -- rash, hives 08/14/2015    History of present pregnancy: Patient entered care at 5 2/7 weeks.  GC/chlamdia on 07/26/15. EDC of 03/31/15 was established by US at 5 6/7 weeks--US showed moderate SCH at that time.   Significant prenatal events:  Seen 10/12 and 10/14 at MAU for vaginal d/c/bleeding and cramping--treated for BV with Metrogel.  Changed to Metronidazole at initial PNV on 08/03/15.  Received Rhophylac 08/04/15 for spotting.  Had viable pregnancy noted on US at 5 6/7 weeks. Last evaluation:  08/14/15--SEEN IN MAU DUE TO N/V AND LOWER ABDOMINAL PAIN. RECEIVED IVFS & ZOFRAN W/ GOOD RESULT. CBC & CMP OBTAINED. MILDLY ELEVATED AST/ALT, LIKELY DUE TO VOMITING. NORMAL LIPASE & AMYLASE. NO VB OR CHANGE IN VAGINAL DISCHARGE. UA SIGNIFICANT FOR DEHYDRATION -- MOD HEMATURIA - SENT FOR CULTURE. F/U ON AST/ALT. ABDOMINAL PAIN NOT A NEW COMPLAINT -- TREATED ON  10/20 FOR BV AND YEAST. D/C'D HOME IN STABLE CONDITION. OB F/U AS PREVIOUSLY SCHEDULED.  Has NOB visit scheduled 09/01/15 with VL.   OB History    Gravida Para Term Preterm AB TAB SAB Ectopic Multiple Living   2 1 1  0 0 0 0 0 0 1    2013--SVB, 40 weeks, 24 hour labor, 7+9, female, [facility]  Past Medical History  Diagnosis Date  . Acne   . History of chlamydia infection   . Medical history non-contributory    Past Surgical History  Procedure Laterality Date  . Hernia repair      umbilical   Family History: family history includes Asthma in her sister; Kidney disease in her sister; Learning disabilities in her sister.   Social History:  reports that she has never smoked. She has never used smokeless tobacco. She reports that she does not drink alcohol or use illicit drugs.  Patient is African American, of the [place] faith, high-school educated, employed as Conservation officer, naturecashier at [employer], unmarried.  TDAP NA Flu NA  ROS:  Nausea, vomiting earlier today, lower abdominal pain  Allergies  Allergen Reactions  . Amoxicillin Hives  . Penicillins Hives    Has patient had a PCN reaction causing immediate rash, facial/tongue/throat swelling, SOB or lightheadedness with hypotension: Yes Has patient had a PCN reaction causing severe rash involving mucus membranes or skin necrosis: No Has patient had a PCN reaction that required hospitalization Yes Has patient had a PCN reaction occurring within the  last 10 years: No If all of the above answers are "NO", then may proceed with Cephalosporin use.       Blood pressure 123/81, pulse 89, temperature 98.6 F (37 C), temperature source Oral, resp. rate 18, last menstrual period 06/24/2015.  Lips dry, patient appeared fatigued on admission--feeling better now. Chest clear Heart RRR without murmur Abd gravid, NT, FH 8 weeks Pelvic: Cervix closed, long, hard stool palpated in rectum through vaginal  wall Ext: WNL  Results for orders placed or performed during the hospital encounter of 08/15/15 (from the past 24 hour(s))  Urinalysis, Routine w reflex microscopic (not at [facility])     Status: Abnormal   Collection Time: 08/15/15  6:45 PM  Result Value Ref Range   Color, Urine YELLOW YELLOW   APPearance CLEAR CLEAR   Specific Gravity, Urine >1.030 (H) 1.005 - 1.030   pH 6.0 5.0 - 8.0   Glucose, UA NEGATIVE NEGATIVE mg/dL   Hgb urine dipstick LARGE (A) NEGATIVE   Bilirubin Urine SMALL (A) NEGATIVE   Ketones, ur >80 (A) NEGATIVE mg/dL   Protein, ur NEGATIVE NEGATIVE mg/dL   Urobilinogen, UA 4.0 (H) 0.0 - 1.0 mg/dL   Nitrite NEGATIVE NEGATIVE   Leukocytes, UA NEGATIVE NEGATIVE  Urine microscopic-add on     Status: Abnormal   Collection Time: 08/15/15  6:45 PM  Result Value Ref Range   Squamous Epithelial / LPF FEW (A) RARE   WBC, UA 0-2 <3 WBC/hpf   RBC / HPF 7-10 <3 RBC/hpf   Bacteria, UA FEW (A) RARE   Urine-Other MUCOUS PRESENT   Comprehensive metabolic panel     Status: Abnormal   Collection Time: 08/15/15  7:20 PM  Result Value Ref Range   Sodium 132 (L) 135 - 145 mmol/L   Potassium 4.4 3.5 - 5.1 mmol/L   Chloride 105 101 - 111 mmol/L   CO2 22 22 - 32 mmol/L   Glucose, Bld 92 65 - 99 mg/dL   BUN 7 6 - 20 mg/dL   Creatinine, Ser 0.98 0.44 - 1.00 mg/dL   Calcium 9.1 8.9 - 11.9 mg/dL   Total Protein 7.6 6.5 - 8.1 g/dL   Albumin 3.7 3.5 - 5.0 g/dL   AST 36 15 - 41 U/L   ALT 75 (H) 14 - 54 U/L   Alkaline Phosphatase 90 38 - 126 U/L   Total Bilirubin 0.5 0.3 - 1.2 mg/dL   GFR calc non Af Amer >60 >60 mL/min   GFR calc Af Amer >60 >60 mL/min   Anion gap 5 5 - 15   [place]: IMPRESSION: 1. Single live intrauterine embryo with estimated gestational age [redacted] weeks 0 days by crown-rump length. Ultrasound [facility] 03/26/2016. 2. Small subchorionic hemorrhage, decreased in size since 08/04/2015. 3. Small right ovarian corpus luteum cyst. 4. No adnexal masses or free pelvic  fluid.   Prenatal labs: ABO, Rh:  O+ Antibody:  Neg Rubella:  Non-immune RPR: Non Reactive (06/21 2046)  HBsAg:   Neg HIV: Non Reactive (10/12 1215)  Sickle cell/Hgb electrophoresis:  AA Pap:  NA GC:  Negative 07/25/16 and 07/31/15 Chlamydia:  Negative 07/25/16 and 07/31/15 Other:   Hgb 12.2 at NOB TSH 1.508 Vit D 17    Assessment/Plan: IUP at 7 3/7 weeks  Hyperemesis Mild elevation of LFTs  Plan: Admit to [facility] per consult with Dr. Richardson Dopp on-call for [facility], to allow for longer period of hydration. IV hydration with MVI next bag. Zofran and Phenergan for scheduled administration Repeat  CMP in am, pre-albumin. Diet as tolerated. I&O Daily weights  Eon Zunker, VICKICNM, [place] 08/15/2015, 11:16 PM

## 2015-08-15 NOTE — MAU Note (Signed)
Ongoing pain in abd and vomiting.  Dr told her to come

## 2015-08-16 ENCOUNTER — Encounter (HOSPITAL_COMMUNITY): Payer: Self-pay | Admitting: *Deleted

## 2015-08-16 DIAGNOSIS — O9989 Other specified diseases and conditions complicating pregnancy, childbirth and the puerperium: Secondary | ICD-10-CM | POA: Diagnosis not present

## 2015-08-16 LAB — COMPREHENSIVE METABOLIC PANEL
ALK PHOS: 75 U/L (ref 38–126)
ALT: 56 U/L — ABNORMAL HIGH (ref 14–54)
ANION GAP: 5 (ref 5–15)
AST: 24 U/L (ref 15–41)
Albumin: 3 g/dL — ABNORMAL LOW (ref 3.5–5.0)
BUN: <5 mg/dL — ABNORMAL LOW (ref 6–20)
CALCIUM: 8.4 mg/dL — AB (ref 8.9–10.3)
CO2: 22 mmol/L (ref 22–32)
Chloride: 107 mmol/L (ref 101–111)
Creatinine, Ser: 0.57 mg/dL (ref 0.44–1.00)
GFR calc non Af Amer: >60 mL/min (ref >60)
Glucose, Bld: 92 mg/dL (ref 65–99)
Potassium: 3.6 mmol/L (ref 3.5–5.1)
SODIUM: 134 mmol/L — AB (ref 135–145)
TOTAL PROTEIN: 6.5 g/dL (ref 6.5–8.1)
Total Bilirubin: 0.5 mg/dL (ref 0.3–1.2)

## 2015-08-16 LAB — CULTURE, OB URINE

## 2015-08-16 LAB — PREALBUMIN: Prealbumin: 12.9 mg/dL — ABNORMAL LOW (ref 18–38)

## 2015-08-16 MED ORDER — DOCUSATE SODIUM 100 MG PO CAPS: 100.0000 mg | ORAL_CAPSULE | Freq: Every day | ORAL | Status: DC | PRN

## 2015-08-16 MED ORDER — PROMETHAZINE HCL 25 MG PO TABS
25.0000 mg | ORAL_TABLET | Freq: Four times a day (QID) | ORAL | Status: DC | PRN
  Administered 2015-08-16: 25 mg via ORAL
  Filled 2015-08-16: qty 1

## 2015-08-16 MED ORDER — ONDANSETRON HCL 4 MG PO TABS: 4.0000 mg | ORAL_TABLET | ORAL | Status: DC | PRN

## 2015-08-16 MED ORDER — PROMETHAZINE HCL 25 MG PO TABS: 25.0000 mg | ORAL_TABLET | Freq: Four times a day (QID) | ORAL | Status: DC | PRN

## 2015-08-16 MED ORDER — METOCLOPRAMIDE HCL 10 MG PO TABS: 10.0000 mg | ORAL_TABLET | Freq: Four times a day (QID) | ORAL | Status: DC

## 2015-08-16 NOTE — Discharge Summary (Addendum)
    Obstetric Discharge Summary  Reason for Admission: Hyperemesis gravidarum, constipation.      HPI/Hospital Course.   19 -year-old G2 P1 at 7W 4 D weeks EGA who presented with intractable nausea and vomiting of pregnancy and also with constipation. Patient received antiemetics with improvement in her emesis. By date of discharge she was tolerating a regular diet and denied any more nausea or vomiting. She also received enemas that relieved her constipation.  She was deemed stable for discharge.  She was noted to have mildly elevated LFTs that trended down during her admission.   LABS  CBC    Component Value Date/Time   WBC 11.2* 08/14/2015 2210   RBC 4.25 08/14/2015 2210   HGB 12.0 08/14/2015 2210   HCT 35.1* 08/14/2015 2210   PLT 387 08/14/2015 2210   MCV 82.6 08/14/2015 2210   MCH 28.2 08/14/2015 2210   MCHC 34.2 08/14/2015 2210   RDW 14.0 08/14/2015 2210   LYMPHSABS 1.7 12/31/2011 0911   MONOABS 0.7 12/31/2011 0911   EOSABS 0.1 12/31/2011 0911   BASOSABS 0.1 12/31/2011 0911      CMP     Component Value Date/Time   NA 134* 08/16/2015 0525   K 3.6 08/16/2015 0525   CL 107 08/16/2015 0525   CO2 22 08/16/2015 0525   GLUCOSE 92 08/16/2015 0525   BUN <5* 08/16/2015 0525   CREATININE 0.57 08/16/2015 0525   CALCIUM 8.4* 08/16/2015 0525   PROT 6.5 08/16/2015 0525   ALBUMIN 3.0* 08/16/2015 0525   AST 24 08/16/2015 0525   ALT 56* 08/16/2015 0525   ALKPHOS 75 08/16/2015 0525   BILITOT 0.5 08/16/2015 0525   GFRNONAA >60 08/16/2015 0525   GFRAA >60 08/16/2015 0525     Physical Exam:  General: alert, cooperative and no distress  CVS: S1, s2, RRR Pulm: CTAB Abd: Soft, non tender Ext: Warm and well perfused, no edema no calf tenderness.     Discharge Diagnoses: Hyperemesis gravidarum, constipation  Discharge Information: Date: 08/16/2015 Activity: unrestricted Diet: routine and bland diet Medications: Phenergan, reglan, colace.  Condition: stable and  improved Instructions: refer to practice specific booklet Discharge to: home   [facility] Center[facility]ULWA,Hernan Turnage [facility] 08/16/2015, 11:44 AM

## 2015-08-16 NOTE — Progress Notes (Signed)
Pt verbalizes understanding of d/c instructions, medications, follow up appts, when to seek medical attention and belongings policy. NO IV at this time. No questions. Pts mom is here and will be driving her home. Sheryn BisonGordon, Nishika Parkhurst Warner

## 2015-10-15 NOTE — L&D Delivery Note (Signed)
Delivery Note At 6:33 PM a viable female was delivered via Vaginal, Spontaneous Delivery (Presentation: Left Occiput Anterior).  APGAR: 8, 9; weight pending Placenta status: Intact, Spontaneous.  Cord: 3 vessels with the following complications: None.   Anesthesia: Epidural  Episiotomy: None Lacerations: None Suture Repair: N/A Est. Blood Loss (mL): 200cc  Mom to postpartum.  Baby to Couplet care / Skin to Skin.  Myna HidalgoZAN, Corinna Burkman, M 03/30/2016, 6:51 PM

## 2016-02-03 ENCOUNTER — Encounter (HOSPITAL_COMMUNITY): Payer: Self-pay

## 2016-02-03 ENCOUNTER — Inpatient Hospital Stay (HOSPITAL_COMMUNITY)
Admission: AD | Admit: 2016-02-03 | Discharge: 2016-02-04 | Disposition: A | Discharge status: Home / Self Care | Payer: Medicaid Other | Source: Ambulatory Visit | Attending: Obstetrics and Gynecology | Admitting: Obstetrics and Gynecology

## 2016-02-03 DIAGNOSIS — Z3A32 32 weeks gestation of pregnancy: Secondary | ICD-10-CM | POA: Insufficient documentation

## 2016-02-03 DIAGNOSIS — W010XXA Fall on same level from slipping, tripping and stumbling without subsequent striking against object, initial encounter: Secondary | ICD-10-CM

## 2016-02-03 DIAGNOSIS — O368130 Decreased fetal movements, third trimester, not applicable or unspecified: Secondary | ICD-10-CM | POA: Insufficient documentation

## 2016-02-03 DIAGNOSIS — O26893 Other specified pregnancy related conditions, third trimester: Secondary | ICD-10-CM | POA: Insufficient documentation

## 2016-02-03 DIAGNOSIS — W1830XA Fall on same level, unspecified, initial encounter: Secondary | ICD-10-CM | POA: Insufficient documentation

## 2016-02-03 DIAGNOSIS — R102 Pelvic and perineal pain: Secondary | ICD-10-CM | POA: Insufficient documentation

## 2016-02-03 DIAGNOSIS — Z880 Allergy status to penicillin: Secondary | ICD-10-CM | POA: Insufficient documentation

## 2016-02-03 DIAGNOSIS — E559 Vitamin D deficiency, unspecified: Secondary | ICD-10-CM | POA: Insufficient documentation

## 2016-02-03 NOTE — Progress Notes (Signed)
Notified of pt arrival in MAU and complaint. Will come see pt 

## 2016-02-03 NOTE — MAU Note (Signed)
Was at bday party and slipped on water slide and fell on R side. Was was between 1500-1700. Decreased FM since then. Mentioned to MD Friday had noticed decreased FM. Had stress test and everything ok. Some pelvic pain and pressure. Denies bleeding and no LOF

## 2016-02-04 DIAGNOSIS — R102 Pelvic and perineal pain: Secondary | ICD-10-CM | POA: Diagnosis not present

## 2016-02-04 DIAGNOSIS — E559 Vitamin D deficiency, unspecified: Secondary | ICD-10-CM | POA: Diagnosis not present

## 2016-02-04 DIAGNOSIS — O26893 Other specified pregnancy related conditions, third trimester: Secondary | ICD-10-CM | POA: Diagnosis not present

## 2016-02-04 DIAGNOSIS — Z880 Allergy status to penicillin: Secondary | ICD-10-CM | POA: Diagnosis not present

## 2016-02-04 DIAGNOSIS — W1830XA Fall on same level, unspecified, initial encounter: Secondary | ICD-10-CM | POA: Diagnosis not present

## 2016-02-04 DIAGNOSIS — Z3A32 32 weeks gestation of pregnancy: Secondary | ICD-10-CM | POA: Diagnosis not present

## 2016-02-04 DIAGNOSIS — O368130 Decreased fetal movements, third trimester, not applicable or unspecified: Secondary | ICD-10-CM | POA: Diagnosis not present

## 2016-02-04 MED ORDER — ACETAMINOPHEN 500 MG PO TABS
1000.0000 mg | ORAL_TABLET | Freq: Once | ORAL | Status: AC
  Administered 2016-02-04: 1000 mg via ORAL
  Filled 2016-02-04: qty 2

## 2016-02-04 NOTE — Discharge Instructions (Signed)
Fetal Movement Counts °Patient Name: __________________________________________________ Patient Due Date: ____________________ °Performing a fetal movement count is highly recommended in high-risk pregnancies, but it is good for every pregnant woman to do. Your health care provider may ask you to start counting fetal movements at 28 weeks of the pregnancy. Fetal movements often increase: °· After eating a full meal. °· After physical activity. °· After eating or drinking something sweet or cold. °· At rest. °Pay attention to when you feel the baby is most active. This will help you notice a pattern of your baby's sleep and wake cycles and what factors contribute to an increase in fetal movement. It is important to perform a fetal movement count at the same time each day when your baby is normally most active.  °HOW TO COUNT FETAL MOVEMENTS °· Find a quiet and comfortable area to sit or lie down on your left side. Lying on your left side provides the best blood and oxygen circulation to your baby. °· Write down the day and time on a sheet of paper or in a journal. °· Start counting kicks, flutters, swishes, rolls, or jabs in a 2-hour period. You should feel at least 10 movements within 2 hours. °· If you do not feel 10 movements in 2 hours, wait 2-3 hours and count again. Look for a change in the pattern or not enough counts in 2 hours. °SEEK MEDICAL CARE IF: °· You feel less than 10 counts in 2 hours, tried twice. °· There is no movement in over an hour. °· The pattern is changing or taking longer each day to reach 10 counts in 2 hours. °· You feel the baby is not moving as he or she usually does. °Date: ____________ Movements: ____________ Start time: ____________ Finish time: ____________  °Date: ____________ Movements: ____________ Start time: ____________ Finish time: ____________ °Date: ____________ Movements: ____________ Start time: ____________ Finish time: ____________ °Date: ____________ Movements:  ____________ Start time: ____________ Finish time: ____________ °Date: ____________ Movements: ____________ Start time: ____________ Finish time: ____________ °Date: ____________ Movements: ____________ Start time: ____________ Finish time: ____________ °Date: ____________ Movements: ____________ Start time: ____________ Finish time: ____________ °Date: ____________ Movements: ____________ Start time: ____________ Finish time: ____________  °Date: ____________ Movements: ____________ Start time: ____________ Finish time: ____________ °Date: ____________ Movements: ____________ Start time: ____________ Finish time: ____________ °Date: ____________ Movements: ____________ Start time: ____________ Finish time: ____________ °Date: ____________ Movements: ____________ Start time: ____________ Finish time: ____________ °Date: ____________ Movements: ____________ Start time: ____________ Finish time: ____________ °Date: ____________ Movements: ____________ Start time: ____________ Finish time: ____________ °Date: ____________ Movements: ____________ Start time: ____________ Finish time: ____________  °Date: ____________ Movements: ____________ Start time: ____________ Finish time: ____________ °Date: ____________ Movements: ____________ Start time: ____________ Finish time: ____________ °Date: ____________ Movements: ____________ Start time: ____________ Finish time: ____________ °Date: ____________ Movements: ____________ Start time: ____________ Finish time: ____________ °Date: ____________ Movements: ____________ Start time: ____________ Finish time: ____________ °Date: ____________ Movements: ____________ Start time: ____________ Finish time: ____________ °Date: ____________ Movements: ____________ Start time: ____________ Finish time: ____________  °Date: ____________ Movements: ____________ Start time: ____________ Finish time: ____________ °Date: ____________ Movements: ____________ Start time: ____________ Finish  time: ____________ °Date: ____________ Movements: ____________ Start time: ____________ Finish time: ____________ °Date: ____________ Movements: ____________ Start time: ____________ Finish time: ____________ °Date: ____________ Movements: ____________ Start time: ____________ Finish time: ____________ °Date: ____________ Movements: ____________ Start time: ____________ Finish time: ____________ °Date: ____________ Movements: ____________ Start time: ____________ Finish time: ____________  °Date: ____________ Movements: ____________ Start time: ____________ Finish   time: ____________ °Date: ____________ Movements: ____________ Start time: ____________ Finish time: ____________ °Date: ____________ Movements: ____________ Start time: ____________ Finish time: ____________ °Date: ____________ Movements: ____________ Start time: ____________ Finish time: ____________ °Date: ____________ Movements: ____________ Start time: ____________ Finish time: ____________ °Date: ____________ Movements: ____________ Start time: ____________ Finish time: ____________ °Date: ____________ Movements: ____________ Start time: ____________ Finish time: ____________  °Date: ____________ Movements: ____________ Start time: ____________ Finish time: ____________ °Date: ____________ Movements: ____________ Start time: ____________ Finish time: ____________ °Date: ____________ Movements: ____________ Start time: ____________ Finish time: ____________ °Date: ____________ Movements: ____________ Start time: ____________ Finish time: ____________ °Date: ____________ Movements: ____________ Start time: ____________ Finish time: ____________ °Date: ____________ Movements: ____________ Start time: ____________ Finish time: ____________ °Date: ____________ Movements: ____________ Start time: ____________ Finish time: ____________  °Date: ____________ Movements: ____________ Start time: ____________ Finish time: ____________ °Date: ____________  Movements: ____________ Start time: ____________ Finish time: ____________ °Date: ____________ Movements: ____________ Start time: ____________ Finish time: ____________ °Date: ____________ Movements: ____________ Start time: ____________ Finish time: ____________ °Date: ____________ Movements: ____________ Start time: ____________ Finish time: ____________ °Date: ____________ Movements: ____________ Start time: ____________ Finish time: ____________ °Date: ____________ Movements: ____________ Start time: ____________ Finish time: ____________  °Date: ____________ Movements: ____________ Start time: ____________ Finish time: ____________ °Date: ____________ Movements: ____________ Start time: ____________ Finish time: ____________ °Date: ____________ Movements: ____________ Start time: ____________ Finish time: ____________ °Date: ____________ Movements: ____________ Start time: ____________ Finish time: ____________ °Date: ____________ Movements: ____________ Start time: ____________ Finish time: ____________ °Date: ____________ Movements: ____________ Start time: ____________ Finish time: ____________ °  °This information is not intended to replace advice given to you by your health care provider. Make sure you discuss any questions you have with your health care provider. °  °Document Released: 10/30/2006 Document Revised: 10/21/2014 Document Reviewed: 07/27/2012 °[employer] Interactive Patient Education ©2016 [employer] °Third Trimester of Pregnancy °The third trimester is from week 29 through week 42, months 7 through 9. The third trimester is a time when the fetus is growing rapidly. At the end of the ninth month, the fetus is about 20 inches in length and weighs 6-10 pounds.  °BODY CHANGES °Your body goes through many changes during pregnancy. The changes vary from woman to woman.  °· Your weight will continue to increase. You can expect to gain 25-35 pounds (11-16 kg) by the end of the pregnancy. °· You may  begin to get stretch marks on your hips, abdomen, and breasts. °· You may urinate more often because the fetus is moving lower into your pelvis and pressing on your bladder. °· You may develop or continue to have heartburn as a result of your pregnancy. °· You may develop constipation because certain hormones are causing the muscles that push waste through your intestines to slow down. °· You may develop hemorrhoids or swollen, bulging veins (varicose veins). °· You may have pelvic pain because of the weight gain and pregnancy hormones relaxing your joints between the bones in your pelvis. Backaches may result from overexertion of the muscles supporting your posture. °· You may have changes in your hair. These can include thickening of your hair, rapid growth, and changes in texture. Some women also have hair loss during or after pregnancy, or hair that feels dry or thin. Your hair will most likely return to normal after your baby is born. °· Your breasts will continue to grow and be tender. A yellow discharge may leak from your breasts called colostrum. °· Your belly button may stick out. °·   You may feel short of breath because of your expanding uterus.  You may notice the fetus "dropping," or moving lower in your abdomen.  You may have a bloody mucus discharge. This usually occurs a few days to a week before labor begins.  Your cervix becomes thin and soft (effaced) near your due date. WHAT TO EXPECT AT YOUR PRENATAL EXAMS  You will have prenatal exams every 2 weeks until week 36. Then, you will have weekly prenatal exams. During a routine prenatal visit:  You will be weighed to make sure you and the fetus are growing normally.  Your blood pressure is taken.  Your abdomen will be measured to track your baby's growth.  The fetal heartbeat will be listened to.  Any test results from the previous visit will be discussed.  You may have a cervical check near your due date to see if you have  effaced. At around 36 weeks, your caregiver will check your cervix. At the same time, your caregiver will also perform a test on the secretions of the vaginal tissue. This test is to determine if a type of bacteria, Group B streptococcus, is present. Your caregiver will explain this further. Your caregiver may ask you:  What your birth plan is.  How you are feeling.  If you are feeling the baby move.  If you have had any abnormal symptoms, such as leaking fluid, bleeding, severe headaches, or abdominal cramping.  If you are using any tobacco products, including cigarettes, chewing tobacco, and electronic cigarettes.  If you have any questions. Other tests or screenings that may be performed during your third trimester include:  Blood tests that check for low iron levels (anemia).  Fetal testing to check the health, activity level, and growth of the fetus. Testing is done if you have certain medical conditions or if there are problems during the pregnancy.  HIV (human immunodeficiency virus) testing. If you are at high risk, you may be screened for HIV during your third trimester of pregnancy. FALSE LABOR You may feel small, irregular contractions that eventually go away. These are called Braxton Hicks contractions, or false labor. Contractions may last for hours, days, or even weeks before true labor sets in. If contractions come at regular intervals, intensify, or become painful, it is best to be seen by your caregiver.  SIGNS OF LABOR   Menstrual-like cramps.  Contractions that are 5 minutes apart or less.  Contractions that start on the top of the uterus and spread down to the lower abdomen and back.  A sense of increased pelvic pressure or back pain.  A watery or bloody mucus discharge that comes from the vagina. If you have any of these signs before the 37th week of pregnancy, call your caregiver right away. You need to go to the hospital to get checked immediately. HOME CARE  INSTRUCTIONS   Avoid all smoking, herbs, alcohol, and unprescribed drugs. These chemicals affect the formation and growth of the baby.  Do not use any tobacco products, including cigarettes, chewing tobacco, and electronic cigarettes. If you need help quitting, ask your health care provider. You may receive counseling support and other resources to help you quit.  Follow your caregiver's instructions regarding medicine use. There are medicines that are either safe or unsafe to take during pregnancy.  Exercise only as directed by your caregiver. Experiencing uterine cramps is a good sign to stop exercising.  Continue to eat regular, healthy meals.  Wear a good support bra for  breast tenderness. °· Do not use hot tubs, steam rooms, or saunas. °· Wear your seat belt at all times when driving. °· Avoid raw meat, uncooked cheese, cat litter boxes, and soil used by cats. These carry germs that can cause birth defects in the baby. °· Take your prenatal vitamins. °· Take 1500-2000 mg of calcium daily starting at the 20th week of pregnancy until you deliver your baby. °· Try taking a stool softener (if your caregiver approves) if you develop constipation. Eat more high-fiber foods, such as fresh vegetables or fruit and whole grains. Drink plenty of fluids to keep your urine clear or pale yellow. °· Take warm sitz baths to soothe any pain or discomfort caused by hemorrhoids. Use hemorrhoid cream if your caregiver approves. °· If you develop varicose veins, wear support hose. Elevate your feet for 15 minutes, 3-4 times a day. Limit salt in your diet. °· Avoid heavy lifting, wear low heal shoes, and practice good posture. °· Rest a lot with your legs elevated if you have leg cramps or low back pain. °· Visit your dentist if you have not gone during your pregnancy. Use a soft toothbrush to brush your teeth and be gentle when you floss. °· A sexual relationship may be continued unless your caregiver directs you  otherwise. °· Do not travel far distances unless it is absolutely necessary and only with the approval of your caregiver. °· Take prenatal classes to understand, practice, and ask questions about the labor and delivery. °· Make a trial run to the hospital. °· Pack your hospital bag. °· Prepare the baby's nursery. °· Continue to go to all your prenatal visits as directed by your caregiver. °SEEK MEDICAL CARE IF: °· You are unsure if you are in labor or if your water has broken. °· You have dizziness. °· You have mild pelvic cramps, pelvic pressure, or nagging pain in your abdominal area. °· You have persistent nausea, vomiting, or diarrhea. °· You have a bad smelling vaginal discharge. °· You have pain with urination. °SEEK IMMEDIATE MEDICAL CARE IF:  °· You have a fever. °· You are leaking fluid from your vagina. °· You have spotting or bleeding from your vagina. °· You have severe abdominal cramping or pain. °· You have rapid weight loss or gain. °· You have shortness of breath with chest pain. °· You notice sudden or extreme swelling of your face, hands, ankles, feet, or legs. °· You have not felt your baby move in over an hour. °· You have severe headaches that do not go away with medicine. °· You have vision changes. °  °This information is not intended to replace advice given to you by your health care provider. Make sure you discuss any questions you have with your health care provider. °  °Document Released: 09/24/2001 Document Revised: 10/21/2014 Document Reviewed: 12/01/2012 °Elsevier Interactive Patient Education ©2016 [employer] ° °

## 2016-02-04 NOTE — MAU Provider Note (Signed)
History    Peggy Mason is a 19y.o. G2P1001 at 32.1wks who presents, unannounced, for decreased fetal movement s/p fall.  Patient states that she fell between 1500 and 1700 on her right side and hit the right side of her abdomen, but states "he sits on the left side."  Patient also reports some pelvic pain that started about 15-7320min after the fall.  Reports pain is exaggerated with standing or walking, but has none currently.  Patient states she did not take any medications and had no relief from a hot bath.  Patient also reports some back pain on right side on her lower back.  Patient describes this as an aching pain, but only when standing. Patient denies Ctx, VB, and LOF.  Patient endorses current perception of fetal movement.   Patient Active Problem List   Diagnosis Date Noted  . Hyperemesis affecting pregnancy, antepartum 08/15/2015  . Subchorionic hemorrhage 08/15/2015  . Vitamin D deficiency 08/15/2015  . Susceptible varicella--non-immune 08/15/2015  . Rh negative, maternal - received Rhophylac on 08/04/15 due to spotting 08/14/2015  . Rubella non-immune status, antepartum 08/14/2015  . Allergy to penicillin - rash, hives -- required hospitalization 08/14/2015  . Allergy to amoxicillin -- rash, hives 08/14/2015    Chief Complaint  Patient presents with  . Fall   HPI  OB History    Gravida Para Term Preterm AB TAB SAB Ectopic Multiple Living   2 1 1  0 0 0 0 0 0 1      Past Medical History  Diagnosis Date  . Acne   . History of chlamydia infection   . Medical history non-contributory     Past Surgical History  Procedure Laterality Date  . Hernia repair      umbilical    Family History  Problem Relation Age of Onset  . Asthma Sister   . Learning disabilities Sister   . Kidney disease Sister     dialysis    Social History  Substance Use Topics  . Smoking status: Never Smoker   . Smokeless tobacco: Never Used  . Alcohol Use: No    Allergies:  Allergies   Allergen Reactions  . Amoxicillin Hives  . Penicillins Hives    Has patient had a PCN reaction causing immediate rash, facial/tongue/throat swelling, SOB or lightheadedness with hypotension: Yes Has patient had a PCN reaction causing severe rash involving mucus membranes or skin necrosis: No Has patient had a PCN reaction that required hospitalization Yes Has patient had a PCN reaction occurring within the last 10 years: No If all of the above answers are "NO", then may proceed with Cephalosporin use.    Prescriptions prior to admission  Medication Sig Dispense Refill Last Dose  . docusate sodium (COLACE) 100 MG capsule Take 1 capsule (100 mg total) by mouth daily as needed for mild constipation or moderate constipation. 20 capsule 1   . metoCLOPramide (REGLAN) 10 MG tablet Take 1 tablet (10 mg total) by mouth every 6 (six) hours. 30 tablet 1   . Prenatal Vit-Fe Fumarate-FA (PRENATAL MULTIVITAMIN) TABS tablet Take 1 tablet by mouth daily at 12 noon.   Past Week at Unknown time  . promethazine (PHENERGAN) 25 MG tablet Take 1 tablet (25 mg total) by mouth every 6 (six) hours as needed for nausea or vomiting. 30 tablet 0     ROS  See HPI Above Physical Exam   Blood pressure 124/64, pulse 87, temperature 98 F (36.7 C), resp. rate 18, height 5'  4" (1.626 m), weight 94.076 kg (207 lb 6.4 oz), last menstrual period 06/24/2015.  No results found for this or any previous visit (from the past 24 hour(s)).  Physical Exam  Constitutional: She is oriented to person, place, and time. She appears well-developed and well-nourished. No distress.  HENT:  Head: Normocephalic and atraumatic.  Eyes: Conjunctivae are normal.  Neck: Normal range of motion.  Cardiovascular: Normal rate, regular rhythm and normal heart sounds.   Respiratory: Effort normal and breath sounds normal. No respiratory distress.  GI: Soft. There is no tenderness.  Gravid--fundal height appears slightly LGA, Soft, NT   Musculoskeletal: Normal range of motion. She exhibits no edema.  Neurological: She is alert and oriented to person, place, and time.  Skin: Skin is warm and dry.    FHR: 135 bpm, Mod Var, -Decels, +Accels UC: None graphed or palpated  ED Course  Assessment: IUP at 32.1wks Cat I FT DFM S/P Fall Aches Rh Negative  Plan: -Reports aches have resolved -Discussed fetal movement and ways to promote when decreased -Tylenol XR now and prn for aches and pains -Bleeding Precautions -Rhogam given in office at 30wks -No q/c -Keep appt as scheduled: 02/07/16 -Encouraged to call if any questions or concerns arise prior to next scheduled office visit.  -Discharged to home in improved condition   Cherre Robins CNM, MSN 02/04/2016 12:24 AM

## 2016-03-02 LAB — OB RESULTS CONSOLE GBS: GBS: POSITIVE

## 2016-03-05 ENCOUNTER — Inpatient Hospital Stay (HOSPITAL_COMMUNITY)
Admission: AD | Admit: 2016-03-05 | Discharge: 2016-03-05 | Disposition: A | Discharge status: Home / Self Care | Payer: Medicaid Other | Source: Ambulatory Visit | Attending: Obstetrics & Gynecology | Admitting: Obstetrics & Gynecology

## 2016-03-05 ENCOUNTER — Encounter (HOSPITAL_COMMUNITY): Payer: Self-pay | Admitting: *Deleted

## 2016-03-05 DIAGNOSIS — O4290 Premature rupture of membranes, unspecified as to length of time between rupture and onset of labor, unspecified weeks of gestation: Secondary | ICD-10-CM

## 2016-03-05 DIAGNOSIS — O368190 Decreased fetal movements, unspecified trimester, not applicable or unspecified: Secondary | ICD-10-CM

## 2016-03-05 DIAGNOSIS — O368130 Decreased fetal movements, third trimester, not applicable or unspecified: Secondary | ICD-10-CM | POA: Diagnosis not present

## 2016-03-05 DIAGNOSIS — O26893 Other specified pregnancy related conditions, third trimester: Secondary | ICD-10-CM | POA: Diagnosis not present

## 2016-03-05 DIAGNOSIS — Z3A36 36 weeks gestation of pregnancy: Secondary | ICD-10-CM | POA: Diagnosis not present

## 2016-03-05 DIAGNOSIS — Z880 Allergy status to penicillin: Secondary | ICD-10-CM | POA: Diagnosis not present

## 2016-03-05 DIAGNOSIS — Z6791 Unspecified blood type, Rh negative: Secondary | ICD-10-CM | POA: Insufficient documentation

## 2016-03-05 LAB — AMNISURE RUPTURE OF MEMBRANE (ROM) NOT AT ARMC: Amnisure ROM: NEGATIVE

## 2016-03-05 NOTE — MAU Provider Note (Signed)
  History    20 y/o G2P1 @36  W 3 D EGA c/o decreased fetal movement.  Upon arrival at MAU she reported normal fetal movement.  She also complained of leakage of fluid, amnisure was negative.   Patient Active Problem List   Diagnosis Date Noted  . Hyperemesis affecting pregnancy, antepartum 08/15/2015  . Subchorionic hemorrhage 08/15/2015  . Vitamin D deficiency 08/15/2015  . Susceptible varicella--non-immune 08/15/2015  . Rh negative, maternal - received Rhophylac on 08/04/15 due to spotting 08/14/2015  . Rubella non-immune status, antepartum 08/14/2015  . Allergy to penicillin - rash, hives -- required hospitalization 08/14/2015  . Allergy to amoxicillin -- rash, hives 08/14/2015    Chief Complaint  Patient presents with  . Labor Eval  . Decreased Fetal Movement   HPI  OB History    Gravida Para Term Preterm AB TAB SAB Ectopic Multiple Living   2 1 1  0 0 0 0 0 0 1      Past Medical History  Diagnosis Date  . Acne   . History of chlamydia infection   . Medical history non-contributory     Past Surgical History  Procedure Laterality Date  . Hernia repair      umbilical    Family History  Problem Relation Age of Onset  . Asthma Sister   . Learning disabilities Sister   . Kidney disease Sister     dialysis  . Diabetes Paternal Grandmother     Social History  Substance Use Topics  . Smoking status: Never Smoker   . Smokeless tobacco: Never Used  . Alcohol Use: No    Allergies:  Allergies  Allergen Reactions  . Amoxicillin Hives  . Penicillins Hives    Has patient had a PCN reaction causing immediate rash, facial/tongue/throat swelling, SOB or lightheadedness with hypotension: Yes Has patient had a PCN reaction causing severe rash involving mucus membranes or skin necrosis: No Has patient had a PCN reaction that required hospitalization Yes Has patient had a PCN reaction occurring within the last 10 years: No If all of the above answers are "NO", then may  proceed with Cephalosporin use.    No prescriptions prior to admission    ROS  Constitutional: Denies fevers/chills Cardiovascular: Denies chest pain or palpitations Pulmonary: Denies coughing or wheezing Gastrointestinal: Denies nausea, vomiting or diarrhea Genitourinary: Denies pelvic pain, unusual vaginal bleeding, unusual vaginal discharge, dysuria, urgency or frequency.  Musculoskeletal: Denies muscle or joint aches and pain.  Neurology: Denies abnormal sensations such as tingling or numbness.   Physical Exam Gen: NAD Abdomen: Gravid    Blood pressure 125/76, pulse 90, temperature 98.3 F (36.8 C), temperature source Oral, resp. rate 18, last menstrual period 06/24/2015.  NST: Category 1.   TOCO: Occasional contractions and irritability. CVX: patient declined.   Physical Exam  ED Course Patient felt normal fetal movement while in MAU.  She denied further leakage of fluid.   Assessment: Decreased fetal movement, now back to normal, unlikely ruptured  Plan: Discharge to home.  Fetal kick counts and labor precautions reviewed.   Konrad FelixKULWA,Damilola Flamm WAKURU MD.   03/05/2016 8:06 PM

## 2016-03-05 NOTE — MAU Note (Addendum)
Decreased movement today.  First noted fluid on Saturday, little on Sunday, not continuous.  Feeling a little cramping in last 90 min

## 2016-03-28 ENCOUNTER — Encounter (HOSPITAL_COMMUNITY): Payer: Self-pay | Admitting: *Deleted

## 2016-03-28 ENCOUNTER — Inpatient Hospital Stay (HOSPITAL_COMMUNITY)
Admission: AD | Admit: 2016-03-28 | Discharge: 2016-03-28 | Disposition: A | Discharge status: Home / Self Care | Payer: Medicaid Other | Source: Ambulatory Visit | Attending: Obstetrics and Gynecology | Admitting: Obstetrics and Gynecology

## 2016-03-28 DIAGNOSIS — Z880 Allergy status to penicillin: Secondary | ICD-10-CM | POA: Insufficient documentation

## 2016-03-28 DIAGNOSIS — O26893 Other specified pregnancy related conditions, third trimester: Secondary | ICD-10-CM | POA: Insufficient documentation

## 2016-03-28 DIAGNOSIS — Z3A39 39 weeks gestation of pregnancy: Secondary | ICD-10-CM | POA: Diagnosis not present

## 2016-03-28 DIAGNOSIS — Z6791 Unspecified blood type, Rh negative: Secondary | ICD-10-CM | POA: Insufficient documentation

## 2016-03-28 NOTE — MAU Note (Signed)
PT  SAYS SHE STARTED HURTING BAD  AT 0200.    VE IN OFFICE  YESTERDAY-  3 CM.    DENIES HSV AND  MRSA. GBS- UNSURE

## 2016-03-28 NOTE — MAU Provider Note (Signed)
  History  Peggy Mason is a 20 yo G2P1001 @ 39.5 wks who presents to MAU after calling w/ c/o ctxs q 2-5 min since 0200. Reports active fetus. Denies LOF or VB.  Patient Active Problem List   Diagnosis Date Noted  . Vitamin D deficiency 08/15/2015  . Susceptible varicella--non-immune 08/15/2015  . Rh negative, maternal - received Rhophylac on 08/04/15 due to spotting 08/14/2015  . Rubella non-immune status, antepartum 08/14/2015  . Allergy to penicillin - rash, hives -- required hospitalization 08/14/2015  . Allergy to amoxicillin -- rash, hives 08/14/2015    Chief Complaint  Patient presents with  . Labor Eval   HPI  As above  OB History    Gravida Para Term Preterm AB TAB SAB Ectopic Multiple Living   2 1 1  0 0 0 0 0 0 1      Past Medical History  Diagnosis Date  . Acne   . History of chlamydia infection   . Medical history non-contributory     Past Surgical History  Procedure Laterality Date  . Hernia repair      umbilical    Family History  Problem Relation Age of Onset  . Asthma Sister   . Learning disabilities Sister   . Kidney disease Sister     dialysis  . Diabetes Paternal Grandmother     Social History  Substance Use Topics  . Smoking status: Never Smoker   . Smokeless tobacco: Never Used  . Alcohol Use: No    Allergies:  Allergies  Allergen Reactions  . Amoxicillin Hives  . Penicillins Hives    Has patient had a PCN reaction causing immediate rash, facial/tongue/throat swelling, SOB or lightheadedness with hypotension: Yes Has patient had a PCN reaction causing severe rash involving mucus membranes or skin necrosis: No Has patient had a PCN reaction that required hospitalization Yes Has patient had a PCN reaction occurring within the last 10 years: No If all of the above answers are "NO", then may proceed with Cephalosporin use.    No prescriptions prior to admission    ROS  As per HPI Physical Exam   Blood pressure 118/67,  pulse 87, temperature 98.3 F (36.8 C), temperature source Oral, resp. rate 20, height 5\' 3"  (1.6 m), weight 98.793 kg (217 lb 12.8 oz), last menstrual period 06/24/2015, SpO2 100 %.    Physical Exam  Gen: NAD. Abdomen: soft, NT, no rebound or guarding. Pelvic: per RN = 1-2/70/-2 and 2/70/-2 after ambulation  Ext: WNL. Doptones:135 w/ moderate variability, +accels, no decels Toco: q 3-5 min   ED Course  Assessment: Latent phase of labor Cat 1 FR  Plan: Ambulated x 1-2 hrs with insignificant cervical change.  D/C'd home w/ strict labor precautions. Continue FKCs. OB f/u as scheduled.   Sherre ScarletWILLIAMS, Kaely Mason CNM, MS 03/28/16, 08:17 AM

## 2016-03-28 NOTE — Discharge Instructions (Signed)
Braxton Hicks Contractions °Contractions of the uterus can occur throughout pregnancy. Contractions are not always a sign that you are in labor.  °WHAT ARE BRAXTON HICKS CONTRACTIONS?  °Contractions that occur before labor are called Braxton Hicks contractions, or false labor. Toward the end of pregnancy (32-34 weeks), these contractions can develop more often and may become more forceful. This is not true labor because these contractions do not result in opening (dilatation) and thinning of the cervix. They are sometimes difficult to tell apart from true labor because these contractions can be forceful and people have different pain tolerances. You should not feel embarrassed if you go to the hospital with false labor. Sometimes, the only way to tell if you are in true labor is for your health care provider to look for changes in the cervix. °If there are no prenatal problems or other health problems associated with the pregnancy, it is completely safe to be sent home with false labor and await the onset of true labor. °HOW CAN YOU TELL THE DIFFERENCE BETWEEN TRUE AND FALSE LABOR? °False Labor °· The contractions of false labor are usually shorter and not as hard as those of true labor.   °· The contractions are usually irregular.   °· The contractions are often felt in the front of the lower abdomen and in the groin.   °· The contractions may go away when you walk around or change positions while lying down.   °· The contractions get weaker and are shorter lasting as time goes on.   °· The contractions do not usually become progressively stronger, regular, and closer together as with true labor.   °True Labor °· Contractions in true labor last 30-70 seconds, become very regular, usually become more intense, and increase in frequency.   °· The contractions do not go away with walking.   °· The discomfort is usually felt in the top of the uterus and spreads to the lower abdomen and low back.   °· True labor can be  determined by your health care provider with an exam. This will show that the cervix is dilating and getting thinner.   °WHAT TO REMEMBER °· Keep up with your usual exercises and follow other instructions given by your health care provider.   °· Take medicines as directed by your health care provider.   °· Keep your regular prenatal appointments.   °· Eat and drink lightly if you think you are going into labor.   °· If Braxton Hicks contractions are making you uncomfortable:   °¨ Change your position from lying down or resting to walking, or from walking to resting.   °¨ Sit and rest in a tub of warm water.   °¨ Drink 2-3 glasses of water. Dehydration may cause these contractions.   °¨ Do slow and deep breathing several times an hour.   °WHEN SHOULD I SEEK IMMEDIATE MEDICAL CARE? °Seek immediate medical care if: °· Your contractions become stronger, more regular, and closer together.   °· You have fluid leaking or gushing from your vagina.   °· You have a fever.   °· You pass blood-tinged mucus.   °· You have vaginal bleeding.   °· You have continuous abdominal pain.   °· You have low back pain that you never had before.   °· You feel your baby's head pushing down and causing pelvic pressure.   °· Your baby is not moving as much as it used to.   °  °This information is not intended to replace advice given to you by your health care provider. Make sure you discuss any questions you have with your health care   provider. °  °Document Released: 09/30/2005 Document Revised: 10/05/2013 Document Reviewed: 07/12/2013 °Elsevier Interactive Patient Education ©2016 [employer] °Fetal Movement Counts °Patient Name: __________________________________________________ Patient Due Date: ____________________ °Performing a fetal movement count is highly recommended in high-risk pregnancies, but it is good for every pregnant woman to do. Your health care provider may ask you to start counting fetal movements at 28 weeks of the  pregnancy. Fetal movements often increase: °· After eating a full meal. °· After physical activity. °· After eating or drinking something sweet or cold. °· At rest. °Pay attention to when you feel the baby is most active. This will help you notice a pattern of your baby's sleep and wake cycles and what factors contribute to an increase in fetal movement. It is important to perform a fetal movement count at the same time each day when your baby is normally most active.  °HOW TO COUNT FETAL MOVEMENTS °· Find a quiet and comfortable area to sit or lie down on your left side. Lying on your left side provides the best blood and oxygen circulation to your baby. °· Write down the day and time on a sheet of paper or in a journal. °· Start counting kicks, flutters, swishes, rolls, or jabs in a 2-hour period. You should feel at least 10 movements within 2 hours. °· If you do not feel 10 movements in 2 hours, wait 2-3 hours and count again. Look for a change in the pattern or not enough counts in 2 hours. °SEEK MEDICAL CARE IF: °· You feel less than 10 counts in 2 hours, tried twice. °· There is no movement in over an hour. °· The pattern is changing or taking longer each day to reach 10 counts in 2 hours. °· You feel the baby is not moving as he or she usually does. °Date: ____________ Movements: ____________ Start time: ____________ Finish time: ____________  °Date: ____________ Movements: ____________ Start time: ____________ Finish time: ____________ °Date: ____________ Movements: ____________ Start time: ____________ Finish time: ____________ °Date: ____________ Movements: ____________ Start time: ____________ Finish time: ____________ °Date: ____________ Movements: ____________ Start time: ____________ Finish time: ____________ °Date: ____________ Movements: ____________ Start time: ____________ Finish time: ____________ °Date: ____________ Movements: ____________ Start time: ____________ Finish time: ____________ °Date:  ____________ Movements: ____________ Start time: ____________ Finish time: ____________  °Date: ____________ Movements: ____________ Start time: ____________ Finish time: ____________ °Date: ____________ Movements: ____________ Start time: ____________ Finish time: ____________ °Date: ____________ Movements: ____________ Start time: ____________ Finish time: ____________ °Date: ____________ Movements: ____________ Start time: ____________ Finish time: ____________ °Date: ____________ Movements: ____________ Start time: ____________ Finish time: ____________ °Date: ____________ Movements: ____________ Start time: ____________ Finish time: ____________ °Date: ____________ Movements: ____________ Start time: ____________ Finish time: ____________  °Date: ____________ Movements: ____________ Start time: ____________ Finish time: ____________ °Date: ____________ Movements: ____________ Start time: ____________ Finish time: ____________ °Date: ____________ Movements: ____________ Start time: ____________ Finish time: ____________ °Date: ____________ Movements: ____________ Start time: ____________ Finish time: ____________ °Date: ____________ Movements: ____________ Start time: ____________ Finish time: ____________ °Date: ____________ Movements: ____________ Start time: ____________ Finish time: ____________ °Date: ____________ Movements: ____________ Start time: ____________ Finish time: ____________  °Date: ____________ Movements: ____________ Start time: ____________ Finish time: ____________ °Date: ____________ Movements: ____________ Start time: ____________ Finish time: ____________ °Date: ____________ Movements: ____________ Start time: ____________ Finish time: ____________ °Date: ____________ Movements: ____________ Start time: ____________ Finish time: ____________ °Date: ____________ Movements: ____________ Start time: ____________ Finish time: ____________ °Date: ____________ Movements: ____________ Start  time: ____________ Finish time:   ____________ °Date: ____________ Movements: ____________ Start time: ____________ Finish time: ____________  °Date: ____________ Movements: ____________ Start time: ____________ Finish time: ____________ °Date: ____________ Movements: ____________ Start time: ____________ Finish time: ____________ °Date: ____________ Movements: ____________ Start time: ____________ Finish time: ____________ °Date: ____________ Movements: ____________ Start time: ____________ Finish time: ____________ °Date: ____________ Movements: ____________ Start time: ____________ Finish time: ____________ °Date: ____________ Movements: ____________ Start time: ____________ Finish time: ____________ °Date: ____________ Movements: ____________ Start time: ____________ Finish time: ____________  °Date: ____________ Movements: ____________ Start time: ____________ Finish time: ____________ °Date: ____________ Movements: ____________ Start time: ____________ Finish time: ____________ °Date: ____________ Movements: ____________ Start time: ____________ Finish time: ____________ °Date: ____________ Movements: ____________ Start time: ____________ Finish time: ____________ °Date: ____________ Movements: ____________ Start time: ____________ Finish time: ____________ °Date: ____________ Movements: ____________ Start time: ____________ Finish time: ____________ °Date: ____________ Movements: ____________ Start time: ____________ Finish time: ____________  °Date: ____________ Movements: ____________ Start time: ____________ Finish time: ____________ °Date: ____________ Movements: ____________ Start time: ____________ Finish time: ____________ °Date: ____________ Movements: ____________ Start time: ____________ Finish time: ____________ °Date: ____________ Movements: ____________ Start time: ____________ Finish time: ____________ °Date: ____________ Movements: ____________ Start time: ____________ Finish time:  ____________ °Date: ____________ Movements: ____________ Start time: ____________ Finish time: ____________ °Date: ____________ Movements: ____________ Start time: ____________ Finish time: ____________  °Date: ____________ Movements: ____________ Start time: ____________ Finish time: ____________ °Date: ____________ Movements: ____________ Start time: ____________ Finish time: ____________ °Date: ____________ Movements: ____________ Start time: ____________ Finish time: ____________ °Date: ____________ Movements: ____________ Start time: ____________ Finish time: ____________ °Date: ____________ Movements: ____________ Start time: ____________ Finish time: ____________ °Date: ____________ Movements: ____________ Start time: ____________ Finish time: ____________ °  °This information is not intended to replace advice given to you by your health care provider. Make sure you discuss any questions you have with your health care provider. °  °Document Released: 10/30/2006 Document Revised: 10/21/2014 Document Reviewed: 07/27/2012 °Elsevier Interactive Patient Education ©2016 [employer] Inc[employer] ° °

## 2016-03-28 NOTE — MAU Note (Signed)
Pt c/o contractions every 2-5 mins. Denies LOF or vag bleeding. +FM

## 2016-03-30 ENCOUNTER — Inpatient Hospital Stay (HOSPITAL_COMMUNITY): Payer: Medicaid Other | Admitting: Anesthesiology

## 2016-03-30 ENCOUNTER — Inpatient Hospital Stay (HOSPITAL_COMMUNITY)
Admission: AD | Admit: 2016-03-30 | Discharge: 2016-04-01 | DRG: 775 | Disposition: A | Discharge status: Home / Self Care | Payer: Medicaid Other | Source: Ambulatory Visit | Attending: Obstetrics & Gynecology | Admitting: Obstetrics & Gynecology

## 2016-03-30 ENCOUNTER — Encounter (HOSPITAL_COMMUNITY): Payer: Self-pay | Admitting: Certified Nurse Midwife

## 2016-03-30 DIAGNOSIS — O99824 Streptococcus B carrier state complicating childbirth: Principal | ICD-10-CM | POA: Diagnosis present

## 2016-03-30 DIAGNOSIS — Z3A40 40 weeks gestation of pregnancy: Secondary | ICD-10-CM

## 2016-03-30 DIAGNOSIS — Z23 Encounter for immunization: Secondary | ICD-10-CM | POA: Diagnosis not present

## 2016-03-30 DIAGNOSIS — O360990 Maternal care for other rhesus isoimmunization, unspecified trimester, not applicable or unspecified: Secondary | ICD-10-CM | POA: Diagnosis present

## 2016-03-30 DIAGNOSIS — Z880 Allergy status to penicillin: Secondary | ICD-10-CM

## 2016-03-30 DIAGNOSIS — Z3490 Encounter for supervision of normal pregnancy, unspecified, unspecified trimester: Secondary | ICD-10-CM

## 2016-03-30 DIAGNOSIS — Z888 Allergy status to other drugs, medicaments and biological substances status: Secondary | ICD-10-CM

## 2016-03-30 LAB — CBC
HCT: 37.9 % (ref 36.0–46.0)
HEMOGLOBIN: 12.7 g/dL (ref 12.0–15.0)
MCH: 28 pg (ref 26.0–34.0)
MCHC: 33.5 g/dL (ref 30.0–36.0)
MCV: 83.5 fL (ref 78.0–100.0)
PLATELETS: 335 10*3/uL (ref 150–400)
RBC: 4.54 MIL/uL (ref 3.87–5.11)
RDW: 15.6 % — ABNORMAL HIGH (ref 11.5–15.5)
WBC: 9.2 10*3/uL (ref 4.0–10.5)

## 2016-03-30 LAB — TYPE AND SCREEN
ABO/RH(D): O NEG
ANTIBODY SCREEN: NEGATIVE

## 2016-03-30 MED ORDER — LACTATED RINGERS IV SOLN: 500.0000 mL | Freq: Once | INTRAVENOUS | Status: DC

## 2016-03-30 MED ORDER — IBUPROFEN 600 MG PO TABS
600.0000 mg | ORAL_TABLET | Freq: Four times a day (QID) | ORAL | Status: DC
  Administered 2016-03-31 – 2016-04-01 (×7): 600 mg via ORAL
  Filled 2016-03-30 (×7): qty 1

## 2016-03-30 MED ORDER — OXYTOCIN 40 UNITS IN LACTATED RINGERS INFUSION - SIMPLE MED
2.5000 [IU]/h | INTRAVENOUS | Status: DC
  Filled 2016-03-30: qty 1000

## 2016-03-30 MED ORDER — SOD CITRATE-CITRIC ACID 500-334 MG/5ML PO SOLN: 30.0000 mL | ORAL | Status: DC | PRN

## 2016-03-30 MED ORDER — WITCH HAZEL-GLYCERIN EX PADS: 1.0000 "application " | MEDICATED_PAD | CUTANEOUS | Status: DC | PRN

## 2016-03-30 MED ORDER — SIMETHICONE 80 MG PO CHEW: 80.0000 mg | CHEWABLE_TABLET | ORAL | Status: DC | PRN

## 2016-03-30 MED ORDER — ACETAMINOPHEN 325 MG PO TABS: 650.0000 mg | ORAL_TABLET | ORAL | Status: DC | PRN

## 2016-03-30 MED ORDER — DIPHENHYDRAMINE HCL 50 MG/ML IJ SOLN: 12.5000 mg | INTRAMUSCULAR | Status: DC | PRN

## 2016-03-30 MED ORDER — FENTANYL 2.5 MCG/ML BUPIVACAINE 1/10 % EPIDURAL INFUSION (WH - ANES)
14.0000 mL/h | INTRAMUSCULAR | Status: DC | PRN
  Administered 2016-03-30: 14 mL/h via EPIDURAL

## 2016-03-30 MED ORDER — VANCOMYCIN HCL IN DEXTROSE 1-5 GM/200ML-% IV SOLN
1000.0000 mg | Freq: Two times a day (BID) | INTRAVENOUS | Status: DC
  Administered 2016-03-30: 1000 mg via INTRAVENOUS
  Filled 2016-03-30: qty 200

## 2016-03-30 MED ORDER — DIBUCAINE 1 % RE OINT: 1.0000 "application " | TOPICAL_OINTMENT | RECTAL | Status: DC | PRN

## 2016-03-30 MED ORDER — PHENYLEPHRINE 40 MCG/ML (10ML) SYRINGE FOR IV PUSH (FOR BLOOD PRESSURE SUPPORT)
PREFILLED_SYRINGE | INTRAVENOUS | Status: AC
  Filled 2016-03-30: qty 10

## 2016-03-30 MED ORDER — LACTATED RINGERS IV SOLN: INTRAVENOUS | Status: DC

## 2016-03-30 MED ORDER — EPHEDRINE 5 MG/ML INJ
10.0000 mg | INTRAVENOUS | Status: DC | PRN
  Filled 2016-03-30: qty 2

## 2016-03-30 MED ORDER — LIDOCAINE HCL (PF) 1 % IJ SOLN
INTRAMUSCULAR | Status: DC | PRN
  Administered 2016-03-30 (×2): 6 mL

## 2016-03-30 MED ORDER — LIDOCAINE HCL (PF) 1 % IJ SOLN
30.0000 mL | INTRAMUSCULAR | Status: DC | PRN
  Filled 2016-03-30: qty 30

## 2016-03-30 MED ORDER — PHENYLEPHRINE 40 MCG/ML (10ML) SYRINGE FOR IV PUSH (FOR BLOOD PRESSURE SUPPORT)
80.0000 ug | PREFILLED_SYRINGE | INTRAVENOUS | Status: DC | PRN
  Filled 2016-03-30: qty 5

## 2016-03-30 MED ORDER — OXYCODONE-ACETAMINOPHEN 5-325 MG PO TABS
2.0000 | ORAL_TABLET | ORAL | Status: DC | PRN
Start: 2016-03-30 — End: 2016-03-30

## 2016-03-30 MED ORDER — PRENATAL MULTIVITAMIN CH
1.0000 | ORAL_TABLET | Freq: Every day | ORAL | Status: DC
  Administered 2016-03-31: 1 via ORAL
  Filled 2016-03-30: qty 1

## 2016-03-30 MED ORDER — BENZOCAINE-MENTHOL 20-0.5 % EX AERO
1.0000 | INHALATION_SPRAY | CUTANEOUS | Status: DC | PRN
Start: 2016-03-30 — End: 2016-04-01
  Administered 2016-03-31: 1 via TOPICAL
  Filled 2016-03-30 (×2): qty 56

## 2016-03-30 MED ORDER — FENTANYL 2.5 MCG/ML BUPIVACAINE 1/10 % EPIDURAL INFUSION (WH - ANES)
INTRAMUSCULAR | Status: AC
  Filled 2016-03-30: qty 125

## 2016-03-30 MED ORDER — DIPHENHYDRAMINE HCL 25 MG PO CAPS: 25.0000 mg | ORAL_CAPSULE | Freq: Four times a day (QID) | ORAL | Status: DC | PRN

## 2016-03-30 MED ORDER — ONDANSETRON HCL 4 MG/2ML IJ SOLN: 4.0000 mg | INTRAMUSCULAR | Status: DC | PRN

## 2016-03-30 MED ORDER — OXYCODONE-ACETAMINOPHEN 5-325 MG PO TABS: 1.0000 | ORAL_TABLET | ORAL | Status: DC | PRN

## 2016-03-30 MED ORDER — COCONUT OIL OIL: 1.0000 "application " | TOPICAL_OIL | Status: DC | PRN

## 2016-03-30 MED ORDER — BUTORPHANOL TARTRATE 1 MG/ML IJ SOLN
1.0000 mg | INTRAMUSCULAR | Status: DC | PRN
  Administered 2016-03-30: 1 mg via INTRAVENOUS
  Filled 2016-03-30: qty 1

## 2016-03-30 MED ORDER — TETANUS-DIPHTH-ACELL PERTUSSIS 5-2.5-18.5 LF-MCG/0.5 IM SUSP
0.5000 mL | Freq: Once | INTRAMUSCULAR | Status: AC
  Administered 2016-04-01: 0.5 mL via INTRAMUSCULAR

## 2016-03-30 MED ORDER — SENNOSIDES-DOCUSATE SODIUM 8.6-50 MG PO TABS
2.0000 | ORAL_TABLET | ORAL | Status: DC
  Administered 2016-03-31 – 2016-04-01 (×2): 2 via ORAL
  Filled 2016-03-30 (×2): qty 2

## 2016-03-30 MED ORDER — ONDANSETRON HCL 4 MG PO TABS: 4.0000 mg | ORAL_TABLET | ORAL | Status: DC | PRN

## 2016-03-30 MED ORDER — ONDANSETRON HCL 4 MG/2ML IJ SOLN: 4.0000 mg | Freq: Four times a day (QID) | INTRAMUSCULAR | Status: DC | PRN

## 2016-03-30 MED ORDER — LACTATED RINGERS IV SOLN: 500.0000 mL | INTRAVENOUS | Status: DC | PRN

## 2016-03-30 MED ORDER — ZOLPIDEM TARTRATE 5 MG PO TABS: 5.0000 mg | ORAL_TABLET | Freq: Every evening | ORAL | Status: DC | PRN

## 2016-03-30 MED ORDER — OXYTOCIN BOLUS FROM INFUSION: 500.0000 mL | INTRAVENOUS | Status: DC

## 2016-03-30 NOTE — Anesthesia Preprocedure Evaluation (Signed)
Anesthesia Evaluation  Patient identified by MRN, date of birth, ID band Patient awake    Reviewed: Allergy & Precautions, NPO status , Patient's Chart, lab work & pertinent test results  Airway Mallampati: II  TM Distance: >3 FB Neck ROM: Full    Dental no notable dental hx.    Pulmonary neg pulmonary ROS,    Pulmonary exam normal breath sounds clear to auscultation       Cardiovascular negative cardio ROS Normal cardiovascular exam Rhythm:Regular Rate:Normal     Neuro/Psych negative neurological ROS  negative psych ROS   GI/Hepatic negative GI ROS, Neg liver ROS,   Endo/Other  negative endocrine ROS  Renal/GU negative Renal ROS  negative genitourinary   Musculoskeletal negative musculoskeletal ROS (+)   Abdominal (+) + obese,   Peds negative pediatric ROS (+)  Hematology negative hematology ROS (+)   Anesthesia Other Findings   Reproductive/Obstetrics negative OB ROS                             Anesthesia Physical Anesthesia Plan  ASA: II  Anesthesia Plan: Epidural   Post-op Pain Management:    Induction: Intravenous  Airway Management Planned: Natural Airway  Additional Equipment:   Intra-op Plan:   Post-operative Plan:   Informed Consent: I have reviewed the patients History and Physical, chart, labs and discussed the procedure including the risks, benefits and alternatives for the proposed anesthesia with the patient or authorized representative who has indicated his/her understanding and acceptance.   Dental advisory given  Plan Discussed with: CRNA  Anesthesia Plan Comments:         Anesthesia Quick Evaluation

## 2016-03-30 NOTE — MAU Note (Signed)
Pt to walk for 2 hours per Dr. Charlotta Newtonzan.

## 2016-03-30 NOTE — Anesthesia Procedure Notes (Signed)
Epidural Patient location during procedure: OB  Staffing Anesthesiologist: Sherrian DiversENENNY, Roshni Burbano  Preanesthetic Checklist Completed: patient identified, site marked, surgical consent, pre-op evaluation, timeout performed, IV checked, risks and benefits discussed and monitors and equipment checked  Epidural Patient position: sitting Prep: DuraPrep Patient monitoring: heart rate and blood pressure Approach: midline Location: L4-L5 Injection technique: LOR saline  Needle:  Needle type: Tuohy  Needle gauge: 17 G Needle length: 9 cm Needle insertion depth: 9 cm Catheter type: closed end flexible Catheter size: 19 Gauge Catheter at skin depth: 16 cm Test dose: negative and Other  Assessment Events: blood not aspirated, injection not painful, no injection resistance, negative IV test and no paresthesia  Additional Notes Reason for block:procedure for pain

## 2016-03-30 NOTE — MAU Note (Signed)
Contractions since 8 this morning may be leaking but not sure

## 2016-03-30 NOTE — H&P (Signed)
Peggy Mason is a 20 y.o. female, G2P1001 at 7368w0d weeks, presenting for painful contractions.  Patient Active Problem List   Diagnosis Date Noted  . Vitamin D deficiency 08/15/2015  . Susceptible varicella--non-immune 08/15/2015  . Rh negative, maternal - received Rhophylac on 08/04/15 due to spotting 08/14/2015  . Rubella non-immune status, antepartum 08/14/2015  . Allergy to penicillin - rash, hives -- required hospitalization 08/14/2015  . Allergy to amoxicillin -- rash, hives 08/14/2015   Pregnancy complicated by: GBS positive, unknown sensitivities, IV Vancomycin in labor Rubella non immune RH neg, s/p RhoGAM H/o Chlamdyia, treated   OB History    Gravida Para Term Preterm AB TAB SAB Ectopic Multiple Living   2 1 1  0 0 0 0 0 0 1    FTNSVD: 7#9oz, pregnancy uncomplicated  Past Medical History  Diagnosis Date  . Acne   . History of chlamydia infection   . Medical history non-contributory    Past Surgical History  Procedure Laterality Date  . Hernia repair      umbilical   Family History: family history includes Asthma in her sister; Diabetes in her paternal grandmother; Kidney disease in her sister; Learning disabilities in her sister. Social History:  reports that she has never smoked. She has never used smokeless tobacco. She reports that she does not drink alcohol or use illicit drugs.   Prenatal Transfer Tool  Maternal Diabetes: No Genetic Screening: Normal Maternal Ultrasounds/Referrals: Normal Fetal Ultrasounds or other Referrals:  None Maternal Substance Abuse:  No Significant Maternal Medications:  None Significant Maternal Lab Results: Lab values include: Group B Strep positive, Rh negative, Other:   ROS:  No headache, no blurry vision, no RUQ pain  Allergies  Allergen Reactions  . Amoxicillin Hives  . Penicillins Hives    Has patient had a PCN reaction causing immediate rash, facial/tongue/throat swelling, SOB or lightheadedness with hypotension:  Yes Has patient had a PCN reaction causing severe rash involving mucus membranes or skin necrosis: No Has patient had a PCN reaction that required hospitalization Yes Has patient had a PCN reaction occurring within the last 10 years: No If all of the above answers are "NO", then may proceed with Cephalosporin use.     Dilation: 4 Effacement (%): 70 Station: -2 Exam by:: A. Jones RN Blood pressure 135/79, pulse 96, temperature 99 F (37.2 C), temperature source Oral, resp. rate 20, height 5\' 3"  (1.6 m), weight 98.431 kg (217 lb), last menstrual period 06/24/2015.  Gen: NAD CV: RRR Lungs: CTAB Abd gravid, NT Ext: no edema, no calf tenderness bilaterally  FHR: Category I UCs:  q584min  Prenatal labs: ABO, Rh:  O negative Antibody:  negative Rubella:  NON IMMUNE RPR: Non Reactive (06/21 2046)  HBsAg:    HIV: Non Reactive (10/12 1215)  GBS: Positive (05/20 0000)  Assessment/Plan: IUP at 9168w0d in latent labor  Plan: FWB: CAT.I Labor: expectant management Admit to Birthing Suite Routine  orders Pain IV medicaiton as needed Vancomycin for GBS prophylaxis  Myna HidalgoJennifer Londynn Sonoda, DO [phone] (pager) [phone] (office)

## 2016-03-30 NOTE — Progress Notes (Signed)
OB PN:  S: Pt resting comfortably s/p epidural, no acute complaints  O: BP 102/57 mmHg  Pulse 100  Temp(Src) 98.2 F (36.8 C) (Oral)  Resp 16  Ht 5\' 3"  (1.6 m)  Wt 98.431 kg (217 lb)  BMI 38.45 kg/m2  LMP 06/24/2015  FHT: 140bpm, moderate variablity, + accels, no decels Toco: q1054min SVE: 6/80/-2, AROM clear fluid  A/P: 20 y.o. G2P1001 @ 8260w0d in active labor 1. FWB: Cat. I 2. Labor: continue expectant management Pain: epidural in place GBS: continue IV Vancomycin  Peggy HidalgoJennifer Hanaa Payes, DO [phone] (pager) [phone] (office)

## 2016-03-31 LAB — CBC
HEMATOCRIT: 33.2 % — AB (ref 36.0–46.0)
HEMOGLOBIN: 11.1 g/dL — AB (ref 12.0–15.0)
MCH: 27.8 pg (ref 26.0–34.0)
MCHC: 33.4 g/dL (ref 30.0–36.0)
MCV: 83.2 fL (ref 78.0–100.0)
Platelets: 263 10*3/uL (ref 150–400)
RBC: 3.99 MIL/uL (ref 3.87–5.11)
RDW: 15.7 % — ABNORMAL HIGH (ref 11.5–15.5)
WBC: 15.8 10*3/uL — ABNORMAL HIGH (ref 4.0–10.5)

## 2016-03-31 LAB — RPR: RPR Ser Ql: NONREACTIVE

## 2016-03-31 NOTE — Anesthesia Postprocedure Evaluation (Signed)
Anesthesia Post Note  Patient: Peggy Mason  Procedure(s) Performed: * No procedures listed *  Patient location during evaluation: Mother Baby Anesthesia Type: Epidural Level of consciousness: awake Pain management: satisfactory to patient Vital Signs Assessment: post-procedure vital signs reviewed and stable Respiratory status: spontaneous breathing Cardiovascular status: stable Anesthetic complications: no     Last Vitals:  Filed Vitals:   03/31/16 0140 03/31/16 0600  BP: 118/69 111/63  Pulse: 73 65  Temp: 36.7 C 36.7 C  Resp: 16 18    Last Pain:  Filed Vitals:   03/31/16 1021  PainSc: 0-No pain   Pain Goal: Patients Stated Pain Goal: 0 (03/30/16 1500)               Cephus ShellingBURGER,Keri Veale

## 2016-03-31 NOTE — Progress Notes (Signed)
Postpartum Note Day # 1  S:  Patient resting comfortable in bed.  Pain controlled.  Tolerating general diet. + flatus, no BM.  Lochia appropriate.  Ambulating without difficulty.  She denies n/v/f/c, SOB, or CP.  Pt plans on breastfeeding.  O: Temp:  [98 F (36.7 C)-99.3 F (37.4 C)] 98 F (36.7 C) (06/18 0600) Pulse Rate:  [65-125] 65 (06/18 0600) Resp:  [16-20] 18 (06/18 0600) BP: (102-143)/(47-97) 111/63 mmHg (06/18 0600) SpO2:  [98 %-100 %] 100 % (06/18 0140) Weight:  [98.431 kg (217 lb)] 98.431 kg (217 lb) (06/17 1145)   Gen: A&Ox3, NAD CV: RRR, no MRG Resp: CTAB Abdomen: soft, NT, ND Uterus: firm, non-tender, below umbilicus Ext: No edema, no calf tenderness bilaterally  Labs:  Recent Labs  03/30/16 1525 03/31/16 0534  HGB 12.7 11.1*    A/P: Pt is a 20 y.o. [id number] s/p NSVD, PPD#1  - Pain well controlled -GU: Voiding freely -GI: Tolerating general diet -Activity: encouraged sitting up to chair and ambulation as tolerated -Prophylaxis: early ambulation -Labs: stable as above - Rubella NON-immune, will need MMR prior to discharge  Dispo: Continue routine postpartum care, plan for discharge, 6/19.  Peggy Pupa, DO [phone] (pager) [phone] (office)

## 2016-03-31 NOTE — Lactation Note (Signed)
This note was copied from a baby's chart. Lactation Consultation Note  Patient Name: Peggy Mason EAVWU'JToday's Date: 03/31/2016 Reason for consult: Initial assessment  Visited with Mom, baby 18 hrs old, and sleeping skin to skin on Mom's chest.  Recommended continued skin to skin, and feeding often on cue.  Mom experienced BFer with 1st child (22 yrs old).  Mom denies having any difficulty with latching.  Brochure left in room.  Informed her of IP and OP lactation services available to her.  Encouraged her to call prn, and follow up with LC in am.    Consult Status Consult Status: Follow-up Date: 04/01/16 Follow-up type: In-patient    Judee ClaraSmith, Chrishon Martino E 03/31/2016, 1:28 PM

## 2016-04-01 MED ORDER — IBUPROFEN 600 MG PO TABS: 600.0000 mg | ORAL_TABLET | Freq: Four times a day (QID) | ORAL | Status: DC | PRN

## 2016-04-01 NOTE — Lactation Note (Signed)
This note was copied from a baby's chart. Lactation Consultation Note  Patient Name: Peggy Mason Reason for consult: Follow-up assessment  Baby is 42 hours old  6% weight loss,  Per mom breast feeding is going well , but I feel pinching at times  LC observed the baby  already latched and noted a shallow latch  Without depth, also the baby seemed very unsettled latched.  Baby released after approx 10 mins and LC assisted mom to reposition using the cross cradle With adequate support. Depth achieved , mom comfortable , increased swallows noted increased with breast  Compressions. Baby fed for 20 mins,  Prior to latch check earlier, with moms permission to assess breast tissue and nipples without breakdown or pinkness.  LC instructed mom on the use shells , hand pump, #24 flange good fit for today, #27 flange provided for when the milk comes in .  Sore nipple and engorgement prevention and tx reviewed.  [facility]  Mother informed of post-discharge support and given phone number to the lactation department, including services for phone call  assistance; out-patient appointments; and breastfeeding support group. List of other breastfeeding resources in the community given in  the handout. Encouraged mother to call for problems or concerns related to breastfeeding.    Maternal Data Has patient been taught Hand Expression?: Yes  Feeding Feeding Type: Breast Fed  LATCH Score/Interventions Latch: Grasps breast easily, tongue down, lips flanged, rhythmical sucking. Intervention(s): Adjust position;Assist with latch;Breast massage;Breast compression  Audible Swallowing: A few with stimulation  Type of Nipple: Everted at rest and after stimulation  Comfort (Breast/Nipple): Soft / non-tender     Hold (Positioning): Assistance needed to correctly position infant at breast and maintain latch. Intervention(s): Breastfeeding basics reviewed;Support  Pillows;Position options;Skin to skin  LATCH Score: 8  Lactation Tools Discussed/Used Tools: Pump;Flanges;Shells (LC instructed mom on the use , #24 Flange good for today. #27 Flange given for when milk comes ) Flange Size: 27 Shell Type: Inverted Breast pump type: Manual WIC Program: Yes   Consult Status Consult Status: Follow-up Date: 04/01/16 Follow-up type: In-patient    Peggy Mason, Peggy Mason Mason, 12:40 PM

## 2016-04-01 NOTE — Discharge Instructions (Signed)

## 2016-04-01 NOTE — Discharge Summary (Signed)
[facility] Summary   Patient Name:   Peggy Mason DOB:     03-24-1996 MRN:     [id number]  Date of Admission:   03/30/2016 Date of Discharge:  04/01/2016  Admitting diagnosis:    40 WKS, CTXS Principal Problem:   Vaginal delivery  Term Pregnancy Delivered    Discharge diagnosis:    40 WKS, CTXS Principal Problem:   Vaginal delivery  Term Pregnancy Delivered                                                                     Post partum procedures: TDAP  Type of Delivery:  SVB  Delivering Provider: Myna HidalgoZAN, JENNIFER  for [facility]  Date of Delivery:  03/30/16  Newborn Data:    Live born female  Birth Weight: 6 lb 10 oz (3005 g) APGAR: 8, 9  Baby's Name:   Baby Feeding:   Breast Disposition:   home with mother  Complications:   None  Hospital course:      Onset of Labor With Vaginal Delivery     20 y.o. yo [id number] at 5623w0d was admitted in Active Labor on 03/30/2016. Patient had an uncomplicated labor course as follows:  Membrane Rupture Time/Date: 6:05 PM ,03/30/2016   Intrapartum Procedures: Episiotomy: None [1]                                         Lacerations:  None [1]  Patient had a delivery of a Non Viable infant. 03/30/2016  Information for the patient's newborn:  Maxwell Marionshley, Boy Yarnell [[id number]][[id number]]  Delivery Method: Vaginal, Spontaneous Delivery (Filed from Delivery Summary)    Pateint had an uncomplicated postpartum course.  She is ambulating, tolerating a regular diet, passing flatus, and urinating well. Patient is discharged home in stable condition on 04/01/2016.  Baby RH negative, no need for maternal Rhophylac   Physical Exam:   Filed Vitals:   03/31/16 0140 03/31/16 0600 03/31/16 1737 04/01/16 0645  BP: 118/69 111/63 136/75 108/60  Pulse: 73 65 78 72  Temp: 98 F (36.7 C) 98 F (36.7 C) 98.4 F (36.9 C) 97.7 F (36.5 C)  TempSrc:  Oral Oral Oral  Resp: 16 18 18 18   Height:      Weight:      SpO2: 100%  99%     General: alert Lochia: appropriate Uterine Fundus: firm Incision: N/A DVT Evaluation: No evidence of DVT seen on physical exam.  Labs:  CBC Latest Ref Rng 03/31/2016 03/30/2016 08/14/2015  WBC 4.0 - 10.5 K/uL 15.8(H) 9.2 11.2(H)  Hemoglobin 12.0 - 15.0 g/dL 11.1(L) 12.7 12.0  Hematocrit 36.0 - 46.0 % 33.2(L) 37.9 35.1(L)  Platelets 150 - 400 K/uL 263 335 387     CMP Latest Ref Rng 08/16/2015 08/15/2015 08/14/2015  Glucose 65 - 99 mg/dL 92 92 81  BUN 6 - 20 mg/dL <1(H<5(L) 7 9  Creatinine 0.860.44 - 1.00 mg/dL 5.780.57 4.690.55 6.290.54  Sodium 135 - 145 mmol/L 134(L) 132(L) 134(L)  Potassium 3.5 - 5.1 mmol/L 3.6 4.4 4.3  Chloride 101 - 111 mmol/L 107 105 107  CO2 22 - 32 mmol/L 22  22 22  Calcium 8.9 - 10.3 mg/dL 1.6(X) 9.1 9.2  Total Protein 6.5 - 8.1 g/dL 6.5 7.6 7.4  Total Bilirubin 0.3 - 1.2 mg/dL 0.5 0.5 0.3  Alkaline Phos 38 - 126 U/L 75 90 90  AST 15 - 41 U/L 24 36 45(H)  ALT 14 - 54 U/L 56(H) 75(H) 88(H)    Discharge instruction: per After Visit Summary and "Baby and Me Booklet".  After Visit Meds:    Medication List    STOP taking these medications        promethazine 25 MG tablet  Commonly known as:  PHENERGAN      TAKE these medications        docusate sodium 100 MG capsule  Commonly known as:  COLACE  Take 1 capsule (100 mg total) by mouth daily as needed for mild constipation or moderate constipation.     ibuprofen 600 MG tablet  Commonly known as:  ADVIL,MOTRIN  Take 1 tablet (600 mg total) by mouth every 6 (six) hours as needed.     prenatal multivitamin Tabs tablet  Take 1 tablet by mouth daily at 12 noon.        Diet: routine diet  Activity: Advance as tolerated. Pelvic rest for 6 weeks.   Outpatient follow up:4-5 weeks, then at 6 for Nexplanon Follow up Appt:No future appointments. Follow up visit: No Follow-up on file.  Postpartum contraception: Nexplanon at 6 weeks.  04/01/2016 Nigel Bridgeman, CNM

## 2016-04-25 ENCOUNTER — Other Ambulatory Visit: Payer: Self-pay | Admitting: Obstetrics and Gynecology

## 2016-04-25 ENCOUNTER — Inpatient Hospital Stay (HOSPITAL_COMMUNITY)
Admission: AD | Admit: 2016-04-25 | Discharge: 2016-04-26 | Disposition: A | Discharge status: Home / Self Care | Payer: Medicaid Other | Source: Ambulatory Visit | Attending: Obstetrics and Gynecology | Admitting: Obstetrics and Gynecology

## 2016-04-25 DIAGNOSIS — B951 Streptococcus, group B, as the cause of diseases classified elsewhere: Secondary | ICD-10-CM

## 2016-04-25 DIAGNOSIS — O864 Pyrexia of unknown origin following delivery: Secondary | ICD-10-CM | POA: Insufficient documentation

## 2016-04-25 DIAGNOSIS — J029 Acute pharyngitis, unspecified: Secondary | ICD-10-CM | POA: Diagnosis present

## 2016-04-25 DIAGNOSIS — Z880 Allergy status to penicillin: Secondary | ICD-10-CM | POA: Insufficient documentation

## 2016-04-25 DIAGNOSIS — N644 Mastodynia: Secondary | ICD-10-CM | POA: Diagnosis present

## 2016-04-25 MED ORDER — ACETAMINOPHEN 500 MG PO TABS
1000.0000 mg | ORAL_TABLET | ORAL | Status: AC
  Administered 2016-04-26: 1000 mg via ORAL
  Filled 2016-04-25: qty 2

## 2016-04-25 MED ORDER — IBUPROFEN 600 MG PO TABS
600.0000 mg | ORAL_TABLET | ORAL | Status: AC
  Administered 2016-04-26: 600 mg via ORAL
  Filled 2016-04-25: qty 1

## 2016-04-25 NOTE — MAU Note (Signed)
Pt reports headache for 2 days, today started with a fever of 102.5, states she is having body aches, sore throat, earache, and bilateral breast pain (breast feeding. ).

## 2016-04-25 NOTE — MAU Provider Note (Signed)
History   20 yo G2P2002 s/p SVB on 6/17 presented after calling with report of fever to 102 tonight--started having body aches, sore throat, ear pain, and breast pain yesterday, then onset of fever and chills tonight.  Patient is breastfeeding.  Denies cough, nasal congestion, N/V, diarrhea, viral exposure, dysuria, vaginal bleeding, back pain, leg pain, or any other issue.  Still has some sporadic uterine cramping, but denies any significant pain or bleeding.  Patient is allergic to PCN and Amoxicillin--received IV Vancomycin during labor for GBS treatment.  Patient Active Problem List   Diagnosis Date Noted  . Positive GBS test 04/25/2016  . Vaginal delivery 03/30/2016  . Vitamin D deficiency 08/15/2015  . Susceptible varicella--non-immune 08/15/2015  . Rh negative, maternal - received Rhophylac on 08/04/15 due to spotting 08/14/2015  . Rubella non-immune status, antepartum 08/14/2015  . Allergy to penicillin - rash, hives -- required hospitalization 08/14/2015  . Allergy to amoxicillin -- rash, hives 08/14/2015    Chief Complaint  Patient presents with  . Fever   HPI:  As above  OB History    Gravida Para Term Preterm AB TAB SAB Ectopic Multiple Living   2 2 2  0 0 0 0 0 0 2      Past Medical History  Diagnosis Date  . Acne   . History of chlamydia infection   . Medical history non-contributory     Past Surgical History  Procedure Laterality Date  . Hernia repair      umbilical    Family History  Problem Relation Age of Onset  . Asthma Sister   . Learning disabilities Sister   . Kidney disease Sister     dialysis  . Diabetes Paternal Grandmother     Social History  Substance Use Topics  . Smoking status: Never Smoker   . Smokeless tobacco: Never Used  . Alcohol Use: No    Allergies:  Allergies  Allergen Reactions  . Amoxicillin Hives  . Penicillins Hives    Has patient had a PCN reaction causing immediate rash, facial/tongue/throat swelling, SOB or  lightheadedness with hypotension: Yes Has patient had a PCN reaction causing severe rash involving mucus membranes or skin necrosis: No Has patient had a PCN reaction that required hospitalization Yes Has patient had a PCN reaction occurring within the last 10 years: No If all of the above answers are "NO", then may proceed with Cephalosporin use.    Prescriptions prior to admission  Medication Sig Dispense Refill Last Dose  . docusate sodium (COLACE) 100 MG capsule Take 1 capsule (100 mg total) by mouth daily as needed for mild constipation or moderate constipation. (Patient not taking: Reported on 03/05/2016) 20 capsule 1 prn at Unknown time  . ibuprofen (ADVIL,MOTRIN) 600 MG tablet Take 1 tablet (600 mg total) by mouth every 6 (six) hours as needed. 30 tablet 2   . Prenatal Vit-Fe Fumarate-FA (PRENATAL MULTIVITAMIN) TABS tablet Take 1 tablet by mouth daily at 12 noon.   Past Week at Unknown time    ROS: Fever, body aches, sore throat, ear pain, breast pain R>L Physical Exam   Blood pressure 127/68, pulse 108, temperature 102.7 F (39.3 C), temperature source Oral, resp. rate 20, SpO2 100 %, unknown if currently breastfeeding.    Physical Exam  Appears to feel ill, but does not appear toxic. Throat red Ears--canals red bilaterally, drum appears full Lymphs--slight enlargement of tonsilar lymph nodes on right, mild tenderness Nasal turbinates slightly boggy and red Sinuses--mildly tender to  palpation of frontal and maxillary sinuses Chest clear Heart RRR without murmur Breasts--full, lactating, right breast with tenderness and erythema across inner aspect, approx  Abd soft, NT, fundus well-involuted, NT Pelvic--no bleeding, cervix closed, NT Negative CVAT Extremities WNL, negative Homan's, no edema   ED Course  Assessment: 4 weeks pp with fever ? Mastitis ? URI  Plan: CBC/diff, UA, urine culture Rapid strep culture. Tylenol and Motrin now. Consulted with  pharmacy--options for treatment of mastitis and URI would be Septra DS or Clindamycin. Will give Clindamycin 300 mg po TID (1st dose now). Observe s/p Tylenol/Motrin dosing for fever status.   Nigel Bridgeman CNM, MSN 04/25/2016 11:57 PM   Addendum: Patient feeling better after Tylenol and Ibuprophen--also received initial dose of Clindamycin.  No further chills. Just obtained urine specimen--sent to culture.  Filed Vitals:   04/25/16 2320 04/26/16 0101  BP: 127/68   Pulse: 108   Temp: 102.7 F (39.3 C) 100.3 F (37.9 C)  TempSrc: Oral Oral  Resp: 20   SpO2: 100%    Results for orders placed or performed during the hospital encounter of 04/25/16 (from the past 24 hour(s))  Rapid strep screen (not at [facility])     Status: None   Collection Time: 04/25/16 11:40 PM  Result Value Ref Range   Streptococcus, Group A Screen (Direct) NEGATIVE NEGATIVE  CBC with Differential/Platelet     Status: None   Collection Time: 04/25/16 11:52 PM  Result Value Ref Range   WBC 9.0 4.0 - 10.5 K/uL   RBC 4.55 3.87 - 5.11 MIL/uL   Hemoglobin 12.6 12.0 - 15.0 g/dL   HCT 13.0 86.5 - 78.4 %   MCV 84.2 78.0 - 100.0 fL   MCH 27.7 26.0 - 34.0 pg   MCHC 32.9 30.0 - 36.0 g/dL   RDW 69.6 29.5 - 28.4 %   Platelets 366 150 - 400 K/uL   Neutrophils Relative % 61 %   Neutro Abs 5.6 1.7 - 7.7 K/uL   Lymphocytes Relative 30 %   Lymphs Abs 2.7 0.7 - 4.0 K/uL   Monocytes Relative 5 %   Monocytes Absolute 0.5 0.1 - 1.0 K/uL   Eosinophils Relative 3 %   Eosinophils Absolute 0.3 0.0 - 0.7 K/uL   Basophils Relative 1 %   Basophils Absolute 0.1 0.0 - 0.1 K/uL   UA and urine culture pending.  Will d/c home with instructions to alternate Tylenol and Ibuprophen q 6 hours for at least 24 hours for fever control. Rx Ibuprophen 600 mg po q 6 hours prn (patient did not get an Rx at the time of d/c per her report) Rx Clindamycin 300 mg po TID x 7 days. F/u as scheduled at [facility] for pp visit, to call with any worsening of  sx.  Nigel Bridgeman, CNM 04/26/16 0200

## 2016-04-26 ENCOUNTER — Encounter (HOSPITAL_COMMUNITY): Payer: Self-pay | Admitting: *Deleted

## 2016-04-26 LAB — CBC WITH DIFFERENTIAL/PLATELET
Basophils Absolute: 0.1 10*3/uL (ref 0.0–0.1)
Basophils Relative: 1 %
EOS PCT: 3 %
Eosinophils Absolute: 0.3 10*3/uL (ref 0.0–0.7)
HEMATOCRIT: 38.3 % (ref 36.0–46.0)
HEMOGLOBIN: 12.6 g/dL (ref 12.0–15.0)
LYMPHS ABS: 2.7 10*3/uL (ref 0.7–4.0)
LYMPHS PCT: 30 %
MCH: 27.7 pg (ref 26.0–34.0)
MCHC: 32.9 g/dL (ref 30.0–36.0)
MCV: 84.2 fL (ref 78.0–100.0)
Monocytes Absolute: 0.5 10*3/uL (ref 0.1–1.0)
Monocytes Relative: 5 %
NEUTROS ABS: 5.6 10*3/uL (ref 1.7–7.7)
NEUTROS PCT: 61 %
Platelets: 366 10*3/uL (ref 150–400)
RBC: 4.55 MIL/uL (ref 3.87–5.11)
RDW: 14.8 % (ref 11.5–15.5)
WBC: 9 10*3/uL (ref 4.0–10.5)

## 2016-04-26 LAB — URINALYSIS, ROUTINE W REFLEX MICROSCOPIC
Bilirubin Urine: NEGATIVE
GLUCOSE, UA: NEGATIVE mg/dL
Ketones, ur: 15 mg/dL — AB
Leukocytes, UA: NEGATIVE
Nitrite: NEGATIVE
PH: 6 (ref 5.0–8.0)
Protein, ur: 30 mg/dL — AB
SPECIFIC GRAVITY, URINE: 1.02 (ref 1.005–1.030)

## 2016-04-26 LAB — URINE MICROSCOPIC-ADD ON

## 2016-04-26 LAB — RAPID STREP SCREEN (MED CTR MEBANE ONLY): Streptococcus, Group A Screen (Direct): NEGATIVE

## 2016-04-26 MED ORDER — CLINDAMYCIN HCL 300 MG PO CAPS
300.0000 mg | ORAL_CAPSULE | Freq: Three times a day (TID) | ORAL | Status: DC
  Administered 2016-04-26: 300 mg via ORAL
  Filled 2016-04-26 (×3): qty 1

## 2016-04-26 MED ORDER — IBUPROFEN 600 MG PO TABS: 600.0000 mg | ORAL_TABLET | Freq: Four times a day (QID) | ORAL | Status: DC | PRN

## 2016-04-26 MED ORDER — CLINDAMYCIN HCL 300 MG PO CAPS: 300.0000 mg | ORAL_CAPSULE | Freq: Three times a day (TID) | ORAL | Status: AC

## 2016-04-26 NOTE — Discharge Instructions (Signed)
Take Tylenol 2 tablets every 6 hours for at least 24 hours for fever control. Take Ibuprophen 1 tablet every 6 hours for at least 24 hours for fever control. Take antibiotic (Clindamycin) 3 times per day for 1 week for infection. Call for any questions or concerns.  Breastfeeding and Mastitis Mastitis is inflammation of the breast tissue. It can occur in women who are breastfeeding. This can make breastfeeding painful. Mastitis will sometimes go away on its own. Your health care provider will help determine if treatment is needed. CAUSES Mastitis is often associated with a blocked milk (lactiferous) duct. This can happen when too much milk builds up in the breast. Causes of excess milk in the breast can include:  Poor latch-on. If your baby is not latched onto the breast properly, she or he may not empty your breast completely while breastfeeding.  Allowing too much time to pass between feedings.  Wearing a bra or other clothing that is too tight. This puts extra pressure on the lactiferous ducts so milk does not flow through them as it should. Mastitis can also be caused by a bacterial infection. Bacteria may enter the breast tissue through cuts or openings in the skin. In women who are breastfeeding, this may occur because of cracked or irritated skin. Cracks in the skin are often caused when your baby does not latch on properly to the breast. SIGNS AND SYMPTOMS  Swelling, redness, tenderness, and pain in an area of the breast.  Swelling of the glands under the arm on the same side.  Fever may or may not accompany mastitis. If an infection is allowed to progress, a collection of pus (abscess) may develop. DIAGNOSIS  Your health care provider can usually diagnose mastitis based on your symptoms and a physical exam. Tests may be done to help confirm the diagnosis. These may include:  Removal of pus from the breast by applying pressure to the area. This pus can be examined in the lab to  determine which bacteria are present. If an abscess has developed, the fluid in the abscess can be removed with a needle. This can also be used to confirm the diagnosis and determine the bacteria present. In most cases, pus will not be present.  Blood tests to determine if your body is fighting a bacterial infection.  Mammogram or ultrasound tests to rule out other problems or diseases. TREATMENT  Mastitis that occurs with breastfeeding will sometimes go away on its own. Your health care provider may choose to wait 24 hours after first seeing you to decide whether a prescription medicine is needed. If your symptoms are worse after 24 hours, your health care provider will likely prescribe an antibiotic medicine to treat the mastitis. He or she will determine which bacteria are most likely causing the infection and will then select an appropriate antibiotic medicine. This is sometimes changed based on the results of tests performed to identify the bacteria, or if there is no response to the antibiotic medicine selected. Antibiotic medicines are usually given by mouth. You may also be given medicine for pain. HOME CARE INSTRUCTIONS  Only take over-the-counter or prescription medicines for pain, fever, or discomfort as directed by your health care provider.  If your health care provider prescribed an antibiotic medicine, take the medicine as directed. Make sure you finish it even if you start to feel better.  Do not wear a tight or underwire bra. Wear a soft, supportive bra.  Increase your fluid intake, especially if you  have a fever.  Continue to empty the breast. Your health care provider can tell you whether this milk is safe for your infant or needs to be thrown out. You may be told to stop nursing until your health care provider thinks it is safe for your baby. Use a breast pump if you are advised to stop nursing.  Keep your nipples clean and dry.  Empty the first breast completely before going  to the other breast. If your baby is not emptying your breasts completely for some reason, use a breast pump to empty your breasts.  If you go back to work, pump your breasts while at work to stay in time with your nursing schedule.  Avoid allowing your breasts to become overly filled with milk (engorged). SEEK MEDICAL CARE IF:  You have pus-like discharge from the breast.  Your symptoms do not improve with the treatment prescribed by your health care provider within 2 days. SEEK IMMEDIATE MEDICAL CARE IF:  Your pain and swelling are getting worse.  You have pain that is not controlled with medicine.  You have a red line extending from the breast toward your armpit.  You have a fever or persistent symptoms for more than 2-3 days.  You have a fever and your symptoms suddenly get worse. MAKE SURE YOU:   Understand these instructions.  Will watch your condition.  Will get help right away if you are not doing well or get worse.   This information is not intended to replace advice given to you by your health care provider. Make sure you discuss any questions you have with your health care provider.   Document Released: 01/25/2005 Document Revised: 10/05/2013 Document Reviewed: 05/06/2013 Elsevier Interactive Patient Education 2016 [employer]  Upper Respiratory Infection, Adult Most upper respiratory infections (URIs) are caused by a virus. A URI affects the nose, throat, and upper air passages. The most common type of URI is often called "the common cold." HOME CARE   Take medicines only as told by your doctor.  Gargle warm saltwater or take cough drops to comfort your throat as told by your doctor.  Use a warm mist humidifier or inhale steam from a shower to increase air moisture. This may make it easier to breathe.  Drink enough fluid to keep your pee (urine) clear or pale yellow.  Eat soups and other clear broths.  Have a healthy diet.  Rest as needed.  Go back  to work when your fever is gone or your doctor says it is okay.  You may need to stay home longer to avoid giving your URI to others.  You can also wear a face mask and wash your hands often to prevent spread of the virus.  Use your inhaler more if you have asthma.  Do not use any tobacco products, including cigarettes, chewing tobacco, or electronic cigarettes. If you need help quitting, ask your doctor. GET HELP IF:  You are getting worse, not better.  Your symptoms are not helped by medicine.  You have chills.  You are getting more short of breath.  You have brown or red mucus.  You have yellow or brown discharge from your nose.  You have pain in your face, especially when you bend forward.  You have a fever.  You have puffy (swollen) neck glands.  You have pain while swallowing.  You have white areas in the back of your throat. GET HELP RIGHT AWAY IF:   You have very bad  or constant:  Headache.  Ear pain.  Pain in your forehead, behind your eyes, and over your cheekbones (sinus pain).  Chest pain.  You have long-lasting (chronic) lung disease and any of the following:  Wheezing.  Long-lasting cough.  Coughing up blood.  A change in your usual mucus.  You have a stiff neck.  You have changes in your:  Vision.  Hearing.  Thinking.  Mood. MAKE SURE YOU:   Understand these instructions.  Will watch your condition.  Will get help right away if you are not doing well or get worse.   This information is not intended to replace advice given to you by your health care provider. Make sure you discuss any questions you have with your health care provider.   Document Released: 03/18/2008 Document Revised: 02/14/2015 Document Reviewed: 01/05/2014 Elsevier Interactive Patient Education [employer]

## 2016-04-27 LAB — URINE CULTURE

## 2016-04-28 LAB — CULTURE, GROUP A STREP (THRC)

## 2016-11-09 ENCOUNTER — Inpatient Hospital Stay (HOSPITAL_COMMUNITY)
Admission: AD | Admit: 2016-11-09 | Discharge: 2016-11-09 | Disposition: A | Discharge status: Home / Self Care | Payer: Medicaid Other | Source: Ambulatory Visit | Attending: Obstetrics & Gynecology | Admitting: Obstetrics & Gynecology

## 2016-11-09 ENCOUNTER — Encounter (HOSPITAL_COMMUNITY): Payer: Self-pay | Admitting: *Deleted

## 2016-11-09 DIAGNOSIS — N907 Vulvar cyst: Secondary | ICD-10-CM

## 2016-11-09 DIAGNOSIS — R102 Pelvic and perineal pain: Secondary | ICD-10-CM | POA: Diagnosis present

## 2016-11-09 DIAGNOSIS — Z3202 Encounter for pregnancy test, result negative: Secondary | ICD-10-CM | POA: Diagnosis not present

## 2016-11-09 DIAGNOSIS — N921 Excessive and frequent menstruation with irregular cycle: Secondary | ICD-10-CM

## 2016-11-09 DIAGNOSIS — Z880 Allergy status to penicillin: Secondary | ICD-10-CM | POA: Insufficient documentation

## 2016-11-09 DIAGNOSIS — Z975 Presence of (intrauterine) contraceptive device: Secondary | ICD-10-CM

## 2016-11-09 DIAGNOSIS — Z8619 Personal history of other infectious and parasitic diseases: Secondary | ICD-10-CM | POA: Insufficient documentation

## 2016-11-09 DIAGNOSIS — N926 Irregular menstruation, unspecified: Secondary | ICD-10-CM | POA: Diagnosis not present

## 2016-11-09 LAB — WET PREP, GENITAL
Sperm: NONE SEEN
TRICH WET PREP: NONE SEEN
YEAST WET PREP: NONE SEEN

## 2016-11-09 LAB — POCT PREGNANCY, URINE: PREG TEST UR: NEGATIVE

## 2016-11-09 MED ORDER — SULFAMETHOXAZOLE-TRIMETHOPRIM 800-160 MG PO TABS: 1.0000 | ORAL_TABLET | Freq: Two times a day (BID) | ORAL | 0 refills | Status: DC

## 2016-11-09 MED ORDER — OXYCODONE-ACETAMINOPHEN 5-325 MG PO TABS
1.0000 | ORAL_TABLET | Freq: Four times a day (QID) | ORAL | 0 refills | Status: DC | PRN
Start: 2016-11-09 — End: 2017-12-24

## 2016-11-09 MED ORDER — IBUPROFEN 600 MG PO TABS: 600.0000 mg | ORAL_TABLET | Freq: Four times a day (QID) | ORAL | 0 refills | Status: DC | PRN

## 2016-11-09 NOTE — MAU Note (Signed)
I have cyst on L side of vagina that is painful for a week.  Was like golfball size but has gone down alittle. I had one about 4 yrs ago. No vaginal bleeding or d/c

## 2016-11-09 NOTE — MAU Provider Note (Signed)
Chief Complaint: Abdominal Pain   First Provider Initiated Contact with Patient 11/09/16 0425      SUBJECTIVE HPI: Peggy Mason is a 21 y.o. [id number] who presents to maternity admissions reporting left labial abscess x 3-4 days, irregular menstrual bleeding on Nexplanon, and abdominal cramping x 2-3 days.  She reports the abscess/boil is painful even though the size of the lesion has decreased since onset.  It was golf ball sized and is now smaller.  This is a recurrent problem and she last had an abscess with her last pregnancy.  She has tried Tylenol and ibuprofen but they have not helped.  She also reports irregular bleeding with her Nexplanon placed after her baby was born in June of last year. She had a Nexplanon before this child but reports she had regular periods with the first one.  She denies heavy bleeding or s/sx of anemia.  She has not tried any treatments, nothing makes it better or worse.  The bleeding is associated with abdominal cramping but she currently has the cramping but no bleeding since last month. She desires STD testing today but declines HIV and RPR. She denies vaginal bleeding, vaginal itching/burning, urinary symptoms, h/a, dizziness, n/v, or fever/chills.     HPI  Past Medical History:  Diagnosis Date  . Acne   . History of chlamydia infection   . Medical history non-contributory    Past Surgical History:  Procedure Laterality Date  . HERNIA REPAIR     umbilical   Social History   Social History  . Marital status: Single    Spouse name: N/A  . Number of children: N/A  . Years of education: N/A   Occupational History  . Not on file.   Social History Main Topics  . Smoking status: Never Smoker  . Smokeless tobacco: Never Used  . Alcohol use No  . Drug use: No  . Sexual activity: Yes    Birth control/ protection: Implant     Comment: implant removed march 2016   Other Topics Concern  . Not on file   Social History Narrative  . No narrative  on file   No current facility-administered medications on file prior to encounter.    Current Outpatient Prescriptions on File Prior to Encounter  Medication Sig Dispense Refill  . ibuprofen (ADVIL,MOTRIN) 600 MG tablet Take 1 tablet (600 mg total) by mouth every 6 (six) hours as needed. 30 tablet 2  . docusate sodium (COLACE) 100 MG capsule Take 1 capsule (100 mg total) by mouth daily as needed for mild constipation or moderate constipation. (Patient not taking: Reported on 03/05/2016) 20 capsule 1  . Prenatal Vit-Fe Fumarate-FA (PRENATAL MULTIVITAMIN) TABS tablet Take 1 tablet by mouth daily at 12 noon.     Allergies  Allergen Reactions  . Amoxicillin Hives  . Penicillins Hives    Has patient had a PCN reaction causing immediate rash, facial/tongue/throat swelling, SOB or lightheadedness with hypotension: Yes Has patient had a PCN reaction causing severe rash involving mucus membranes or skin necrosis: No Has patient had a PCN reaction that required hospitalization Yes Has patient had a PCN reaction occurring within the last 10 years: No If all of the above answers are "NO", then may proceed with Cephalosporin use.    ROS:  Review of Systems  Constitutional: Negative for chills, fatigue and fever.  Respiratory: Negative for shortness of breath.   Cardiovascular: Negative for chest pain.  Genitourinary: Positive for pelvic pain, vaginal bleeding and vaginal  pain. Negative for difficulty urinating, dysuria, flank pain and vaginal discharge.  Neurological: Negative for dizziness and headaches.  Psychiatric/Behavioral: Negative.      I have reviewed patient's Past Medical Hx, Surgical Hx, Family Hx, Social Hx, medications and allergies.   Physical Exam  Patient Vitals for the past 24 hrs:  BP Temp Pulse Resp Height Weight  11/09/16 0054 125/75 98.4 F (36.9 C) 83 18 5\' 4"  (1.626 m) 192 lb 6.4 oz (87.3 kg)   Constitutional: Well-developed, well-nourished female in no acute  distress.  Cardiovascular: normal rate Respiratory: normal effort GI: Abd soft, non-tender. Pos BS x 4 MS: Extremities nontender, no edema, normal ROM Neurologic: Alert and oriented x 4.  GU: Neg CVAT.  On visual inspection, left labial abscess that is round, ~2 cm in diameter, and nonfluctuant. Mild erythema and mild edema surround the abscess.  PELVIC EXAM: Cervix pink, visually closed, without lesion, scant white creamy discharge, vaginal walls and external genitalia normal Bimanual exam: Cervix 0/long/high, firm, anterior, neg CMT, uterus nontender, nonenlarged, adnexa without tenderness, enlargement, or mass   LAB RESULTS Results for orders placed or performed during the hospital encounter of 11/09/16 (from the past 24 hour(s))  Pregnancy, urine POC     Status: None   Collection Time: 11/09/16  1:19 AM  Result Value Ref Range   Preg Test, Ur NEGATIVE NEGATIVE  Wet prep, genital     Status: Abnormal   Collection Time: 11/09/16  4:30 AM  Result Value Ref Range   Yeast Wet Prep HPF POC NONE SEEN NONE SEEN   Trich, Wet Prep NONE SEEN NONE SEEN   Clue Cells Wet Prep HPF POC PRESENT (A) NONE SEEN   WBC, Wet Prep HPF POC MANY (A) NONE SEEN   Sperm NONE SEEN     --/--/O NEG (06/17 1525)  IMAGING No results found.  MAU Management/MDM: Ordered labs and reviewed results.  Abscess is not fluctuant so no I&D needed today.  Pt unhappy with Nexplanon and bleeding pattern but this is normal for implant.  Bactrim DS BID x 7 days, warm compresses BID.  Ibuprofen 600 mg Q 6 hours PRN and Percocet 5/325, 1-2 tabs, Q 6 hours PRN x 6 tabs.  Pt to f/u at [facility] if she desires Nexplanon removal or to manage irregular bleeding. Pt stable at time of discharge.  ASSESSMENT 1. Vulvar cyst   2. Breakthrough bleeding on Nexplanon   3. Pelvic pain in female     PLAN Discharge home Allergies as of 11/09/2016      Reactions   Amoxicillin Hives   Penicillins Hives   Has patient had a PCN reaction  causing immediate rash, facial/tongue/throat swelling, SOB or lightheadedness with hypotension: Yes Has patient had a PCN reaction causing severe rash involving mucus membranes or skin necrosis: No Has patient had a PCN reaction that required hospitalization Yes Has patient had a PCN reaction occurring within the last 10 years: No If all of the above answers are "NO", then may proceed with Cephalosporin use.      Medication List    STOP taking these medications   docusate sodium 100 MG capsule Commonly known as:  COLACE     TAKE these medications   ibuprofen 600 MG tablet Commonly known as:  ADVIL,MOTRIN Take 1 tablet (600 mg total) by mouth every 6 (six) hours as needed.   oxyCODONE-acetaminophen 5-325 MG tablet Commonly known as:  PERCOCET/ROXICET Take 1-2 tablets by mouth every 6 (six) hours as  needed.   prenatal multivitamin Tabs tablet Take 1 tablet by mouth daily at 12 noon.   sulfamethoxazole-trimethoprim 800-160 MG tablet Commonly known as:  BACTRIM DS,SEPTRA DS Take 1 tablet by mouth 2 (two) times daily.      Follow-up Information    [facility] Schedule an appointment as soon as possible for a visit.   Specialty:  Obstetrics and Gynecology Why:  Return to MAU as needed for emergencies Contact information: [address] [place] [id number] [phone]          Sharen CounterLisa Leftwich-Kirby Certified Nurse-Midwife 11/09/2016  4:57 AM

## 2016-11-09 NOTE — Progress Notes (Signed)
Written and verbal d/c instructions given and understanding voiced. 

## 2016-11-12 LAB — GC/CHLAMYDIA PROBE AMP (~~LOC~~) NOT AT ARMC
CHLAMYDIA, DNA PROBE: NEGATIVE
Neisseria Gonorrhea: NEGATIVE

## 2016-11-13 ENCOUNTER — Inpatient Hospital Stay (HOSPITAL_COMMUNITY)
Admission: AD | Admit: 2016-11-13 | Discharge: 2016-11-13 | Disposition: A | Discharge status: Home / Self Care | Payer: Medicaid Other | Source: Ambulatory Visit | Attending: Obstetrics & Gynecology | Admitting: Obstetrics & Gynecology

## 2016-11-13 ENCOUNTER — Encounter (HOSPITAL_COMMUNITY): Payer: Self-pay | Admitting: *Deleted

## 2016-11-13 ENCOUNTER — Encounter (HOSPITAL_COMMUNITY): Payer: Self-pay | Admitting: Emergency Medicine

## 2016-11-13 ENCOUNTER — Ambulatory Visit (HOSPITAL_COMMUNITY)
Admission: EM | Admit: 2016-11-13 | Discharge: 2016-11-13 | Disposition: A | Discharge status: Home / Self Care | Payer: Medicaid Other | Attending: Family Medicine | Admitting: Family Medicine

## 2016-11-13 DIAGNOSIS — Z3202 Encounter for pregnancy test, result negative: Secondary | ICD-10-CM | POA: Insufficient documentation

## 2016-11-13 DIAGNOSIS — R102 Pelvic and perineal pain: Secondary | ICD-10-CM | POA: Diagnosis present

## 2016-11-13 DIAGNOSIS — N751 Abscess of Bartholin's gland: Secondary | ICD-10-CM

## 2016-11-13 DIAGNOSIS — N764 Abscess of vulva: Secondary | ICD-10-CM

## 2016-11-13 LAB — URINALYSIS, ROUTINE W REFLEX MICROSCOPIC
Bilirubin Urine: NEGATIVE
Glucose, UA: NEGATIVE mg/dL
HGB URINE DIPSTICK: NEGATIVE
Ketones, ur: NEGATIVE mg/dL
NITRITE: NEGATIVE
PROTEIN: 30 mg/dL — AB
SPECIFIC GRAVITY, URINE: 1.026 (ref 1.005–1.030)
pH: 5 (ref 5.0–8.0)

## 2016-11-13 LAB — POCT PREGNANCY, URINE: PREG TEST UR: NEGATIVE

## 2016-11-13 MED ORDER — OXYCODONE-ACETAMINOPHEN 5-325 MG PO TABS
2.0000 | ORAL_TABLET | Freq: Once | ORAL | Status: AC
  Administered 2016-11-13: 2 via ORAL
  Filled 2016-11-13: qty 2

## 2016-11-13 NOTE — Discharge Instructions (Signed)
[facility] Go straight [facility] for follow [facility].

## 2016-11-13 NOTE — ED Triage Notes (Signed)
[facility] with a complaint of a cyst on her left labia x 5 days. The [facility]/2016 for the same and given an antibiotic.

## 2016-11-13 NOTE — MAU Provider Note (Signed)
History     CSN: [id number]  Arrival date and time: 11/13/16 1459   First Provider Initiated Contact with Patient 11/13/16 1535      Chief Complaint  Patient presents with  . labial cyst   Non-pregnant female here with left labial pain. Symptoms started about 1 week ago. She has worsening tenderness and increasing size to the area. Rates pain 4/10. She denies any drainage. She has been using warm compresses which help only temporarily. She is also using Ibuprofen and Percocet which helped initially but not today. Review of records shows she was seen in MAU 4 days ago for same problem and abscess was not ready for I&D. She was given Rx for Bactrim and has been taking it. GC/CT cultures and wet prep were negative from that visit.     Pertinent Gynecological History: Menses: 09/2016 Contraception: Nexplanon Sexually transmitted diseases: past history: CT  Past Medical History:  Diagnosis Date  . Acne   . History of chlamydia infection   . Medical history non-contributory     Past Surgical History:  Procedure Laterality Date  . HERNIA REPAIR     umbilical    Family History  Problem Relation Age of Onset  . Diabetes Paternal Grandmother   . Asthma Sister   . Learning disabilities Sister   . Kidney disease Sister     dialysis    Social History  Substance Use Topics  . Smoking status: Never Smoker  . Smokeless tobacco: Never Used  . Alcohol use No    Allergies:  Allergies  Allergen Reactions  . Amoxicillin Hives  . Penicillins Hives    Has patient had a PCN reaction causing immediate rash, facial/tongue/throat swelling, SOB or lightheadedness with hypotension: Yes Has patient had a PCN reaction causing severe rash involving mucus membranes or skin necrosis: No Has patient had a PCN reaction that required hospitalization Yes Has patient had a PCN reaction occurring within the last 10 years: No If all of the above answers are "NO", then may proceed with  Cephalosporin use.    Prescriptions Prior to Admission  Medication Sig Dispense Refill Last Dose  . ibuprofen (ADVIL,MOTRIN) 600 MG tablet Take 1 tablet (600 mg total) by mouth every 6 (six) hours as needed. 30 tablet 0 11/13/2016 at Unknown time  . oxyCODONE-acetaminophen (PERCOCET/ROXICET) 5-325 MG tablet Take 1-2 tablets by mouth every 6 (six) hours as needed. 6 tablet 0 11/12/2016 at Unknown time  . sulfamethoxazole-trimethoprim (BACTRIM DS,SEPTRA DS) 800-160 MG tablet Take 1 tablet by mouth 2 (two) times daily. 14 tablet 0 11/12/2016 at Unknown time    Review of Systems  Constitutional: Negative for chills and fever.  Genitourinary: Positive for vaginal pain.   Physical Exam   Blood pressure 116/62, pulse 100, temperature 98.4 F (36.9 C), temperature source Oral, resp. rate 18, height 5\' 4"  (1.626 m), weight 87.6 kg (193 lb 0.6 oz), SpO2 100 %, unknown if currently breastfeeding.  Physical Exam  Constitutional: She is oriented to person, place, and time. She appears well-developed and well-nourished. No distress (appears uncomfortable, lying on right side).  HENT:  Head: Normocephalic.  Neck: Normal range of motion.  Respiratory: Effort normal.  Genitourinary:  Genitourinary Comments: Enlarged approx 4cm tender, erythematous and fluctuant abscess  Musculoskeletal: Normal range of motion.  Neurological: She is alert and oriented to person, place, and time.  Skin: Skin is warm and dry.  Psychiatric: She has a normal mood and affect.   Results for orders placed or performed  during the hospital encounter of 11/13/16 (from the past 24 hour(s))  Urinalysis, Routine w reflex microscopic     Status: Abnormal   Collection Time: 11/13/16  3:22 PM  Result Value Ref Range   Color, Urine YELLOW YELLOW   APPearance HAZY (A) CLEAR   Specific Gravity, Urine 1.026 1.005 - 1.030   pH 5.0 5.0 - 8.0   Glucose, UA NEGATIVE NEGATIVE mg/dL   Hgb urine dipstick NEGATIVE NEGATIVE   Bilirubin  Urine NEGATIVE NEGATIVE   Ketones, ur NEGATIVE NEGATIVE mg/dL   Protein, ur 30 (A) NEGATIVE mg/dL   Nitrite NEGATIVE NEGATIVE   Leukocytes, UA SMALL (A) NEGATIVE   RBC / HPF 0-5 0 - 5 RBC/hpf   WBC, UA 6-30 0 - 5 WBC/hpf   Bacteria, UA RARE (A) NONE SEEN   Squamous Epithelial / LPF 6-30 (A) NONE SEEN   Mucous PRESENT   Pregnancy, urine POC     Status: None   Collection Time: 11/13/16  3:41 PM  Result Value Ref Range   Preg Test, Ur NEGATIVE NEGATIVE   MAU Course  Procedures Percocet 5mg  2 po  MDM Labs ordered and reviewed.  Consent form signed. Time out.   Patient positioned and draped with sterile towels.  Preoperative medication: Percocet 2 tablet po    Area cleaned with betadine Local infiltrate with lidocaine 1%. Amount 3 ccs  I&D Scalpel size: #11 blade Incision type: Straight single Complexity: Complex Drained large amount of purulent malodorous drainage Probed with curved hemostat to break up loculations  Wound treatment: Word Catheter placed and inflated with 3 cc. Of NS Packing: none Patient tolerance: Tolerated procedure well   Stable for discharge home  Assessment and Plan   1. Bartholin's gland abscess    Discharge home Sitz baths Ibuprofen prn Continue Bactrim Follow up in [facility] in 4 weeks for cath removal  Allergies as of 11/13/2016      Reactions   Amoxicillin Hives   Penicillins Hives   Has patient had a PCN reaction causing immediate rash, facial/tongue/throat swelling, SOB or lightheadedness with hypotension: Yes Has patient had a PCN reaction causing severe rash involving mucus membranes or skin necrosis: No Has patient had a PCN reaction that required hospitalization Yes Has patient had a PCN reaction occurring within the last 10 years: No If all of the above answers are "NO", then may proceed with Cephalosporin use.      Medication List    TAKE these medications   ibuprofen 600 MG tablet Commonly known as:  ADVIL,MOTRIN Take 1  tablet (600 mg total) by mouth every 6 (six) hours as needed.   NEXPLANON 68 MG Impl implant Generic drug:  etonogestrel 1 each by Subdermal route once.   oxyCODONE-acetaminophen 5-325 MG tablet Commonly known as:  PERCOCET/ROXICET Take 1-2 tablets by mouth every 6 (six) hours as needed.   sulfamethoxazole-trimethoprim 800-160 MG tablet Commonly known as:  BACTRIM DS,SEPTRA DS Take 1 tablet by mouth 2 (two) times daily.       Donette LarryMelanie Trellis Guirguis, CNM 11/13/2016, 3:48 PM

## 2016-11-13 NOTE — MAU Note (Signed)
Patient presents to mau with c/o left labial cyst that she noticed Thursday that has gotten worse. Rating the pain a 10/10 currently. Does endorse having a history of this.

## 2016-11-13 NOTE — Discharge Instructions (Signed)
Bartholin Cyst or Abscess °Introduction °A Bartholin cyst is a fluid-filled sac that forms on a Bartholin gland. Bartholin glands are small glands that are found in the folds of skin (labia) on the sides of the lower opening of the vagina. °This type of cyst causes a bulge on the side of the vagina. A cyst that is not large or infected may not cause problems. However, if the fluid in the cyst becomes infected, the cyst can turn into an abscess. An abscess may cause discomfort or pain. °Follow these instructions at home: °· Take medicines only as told by your doctor. °· If you were prescribed an antibiotic medicine, finish all of it even if you start to feel better. °· Apply warm, wet compresses to the area or take warm, shallow baths that cover your pelvic area (sitz baths). Do this many times each day or as told by your doctor. °· Do not squeeze the cyst. Do not apply heavy pressure to it. °· Do not have sex until the cyst has gone away. °· If your cyst or abscess was opened by your doctor, a small piece of gauze or a drain may have been placed in the area. That lets the cyst drain. Do not remove the gauze or the drain until your doctor tells you it is okay to do that. °· Do not wear tampons. Wear feminine pads as needed for any fluid or blood. °· Keep all follow-up visits as told by your doctor. This is important. °Contact a doctor if: °· Your pain, puffiness (swelling), or redness in the area of the cyst gets worse. °· You have fluid or pus pus coming from the cyst. °· You have a fever. °This information is not intended to replace advice given to you by your health care provider. Make sure you discuss any questions you have with your health care provider. °Document Released: 12/27/2008 Document Revised: 03/07/2016 Document Reviewed: 05/16/2014 °© 2017 Elsevier ° °

## 2016-11-13 NOTE — ED Provider Notes (Signed)
CSN: [id number]     Arrival date & time 11/13/16  1150 History   First MD Initiated Contact with Patient 11/13/16 1318     Chief Complaint  Patient presents with  . Abscess   (Consider location/radiation/quality/duration/timing/severity/associated sxs/prior Treatment) Patient c/o abscess left labia for 5 days.  [facility] and was placed on abx's and pain medication.  She states the pain is worse and she is not better.   The history is provided by the patient.  Abscess  Location:  Pelvis Pelvic abscess location:  Vulva Abscess quality: painful, redness and warmth   Red streaking: no   Duration:  5 days Progression:  Worsening Pain details:    Quality:  Aching   Severity:  Severe   Duration:  5 days   Timing:  Constant   Progression:  Worsening Chronicity:  New   Past Medical History:  Diagnosis Date  . Acne   . History of chlamydia infection   . Medical history non-contributory    Past Surgical History:  Procedure Laterality Date  . HERNIA REPAIR     umbilical   Family History  Problem Relation Age of Onset  . Diabetes Paternal Grandmother   . Asthma Sister   . Learning disabilities Sister   . Kidney disease Sister     dialysis   Social History  Substance Use Topics  . Smoking status: Never Smoker  . Smokeless tobacco: Never Used  . Alcohol use No   OB History    Gravida Para Term Preterm AB Living   2 2 2  0 0 2   SAB TAB Ectopic Multiple Live Births   0 0 0 0 2     Review of Systems  Constitutional: Negative.   HENT: Negative.   Eyes: Negative.   Respiratory: Negative.   Cardiovascular: Negative.   Gastrointestinal: Negative.   Endocrine: Negative.   Genitourinary: Positive for genital sores and vaginal pain.  Musculoskeletal: Negative.   Allergic/Immunologic: Negative.   Neurological: Negative.   Hematological: Negative.     Allergies  Amoxicillin and Penicillins  Home Medications   Prior to Admission medications    Medication Sig Start Date End Date Taking? Authorizing Provider  ibuprofen (ADVIL,MOTRIN) 600 MG tablet Take 1 tablet (600 mg total) by mouth every 6 (six) hours as needed. 11/09/16  Yes Lisa A Leftwich-Kirby, CNM  oxyCODONE-acetaminophen (PERCOCET/ROXICET) 5-325 MG tablet Take 1-2 tablets by mouth every 6 (six) hours as needed. 11/09/16  Yes Lisa A Leftwich-Kirby, CNM  sulfamethoxazole-trimethoprim (BACTRIM DS,SEPTRA DS) 800-160 MG tablet Take 1 tablet by mouth 2 (two) times daily. 11/09/16  Yes Wilmer FloorLisa A Leftwich-Kirby, CNM   Meds Ordered and Administered this Visit  Medications - No data to display  BP 114/69 (BP Location: Left Arm)   Pulse 108   Temp 98.4 F (36.9 C) (Oral)   Resp 18   SpO2 100%  No data found.   Physical Exam  Constitutional: She appears well-developed and well-nourished.  HENT:  Head: Normocephalic and atraumatic.  Eyes: Conjunctivae and EOM are normal. Pupils are equal, round, and reactive to light.  Neck: Normal range of motion. Neck supple.  Cardiovascular: Normal rate, regular rhythm and normal heart sounds.   Pulmonary/Chest: Effort normal and breath sounds normal.  Genitourinary:  Genitourinary Comments: Left labial abscess.  No drainage.  Tender left labia.  Nursing note and vitals reviewed.   Urgent Care Course     Procedures (including critical care time)  Labs Review Labs  Reviewed - No data to display  Imaging Review No results found.   Visual Acuity Review  Right Eye Distance:   Left Eye Distance:   Bilateral Distance:    Right Eye Near:   Left Eye Near:    Bilateral Near:         MDM   1. Left genital labial abscess    Refer back to [facility].      Anselm Pancoast [place], [place] 11/13/16 [phone]

## 2016-11-27 ENCOUNTER — Inpatient Hospital Stay (HOSPITAL_COMMUNITY)
Admission: AD | Admit: 2016-11-27 | Discharge: 2016-11-27 | Disposition: A | Discharge status: Home / Self Care | Payer: Medicaid Other | Source: Ambulatory Visit | Attending: Obstetrics and Gynecology | Admitting: Obstetrics and Gynecology

## 2016-11-27 DIAGNOSIS — Z880 Allergy status to penicillin: Secondary | ICD-10-CM | POA: Insufficient documentation

## 2016-11-27 DIAGNOSIS — J111 Influenza due to unidentified influenza virus with other respiratory manifestations: Secondary | ICD-10-CM | POA: Diagnosis not present

## 2016-11-27 DIAGNOSIS — J101 Influenza due to other identified influenza virus with other respiratory manifestations: Secondary | ICD-10-CM | POA: Diagnosis not present

## 2016-11-27 DIAGNOSIS — R509 Fever, unspecified: Secondary | ICD-10-CM | POA: Diagnosis present

## 2016-11-27 LAB — INFLUENZA PANEL BY PCR (TYPE A & B)
INFLAPCR: NEGATIVE
INFLBPCR: POSITIVE — AB

## 2016-11-27 NOTE — Progress Notes (Signed)
Pt wants note for her job at [employer], pt states it has to be very specific and say the diagnosis. Notified heather hogan cnm, note to return to work for next [employer].  Typed diagnosis in the note per pt request, heather hogan cnm aware.

## 2016-11-27 NOTE — MAU Provider Note (Signed)
History     CSN: [id number]  Arrival date and time: 11/27/16 2130   First Provider Initiated Contact with Patient 11/27/16 2220      Chief Complaint  Patient presents with  . Influenza   Peggy Mason is a 21 y.o. [phone] who presents today with fever, body aches, chills, cough and runny nose x 3 days. She also has a ward catheter in place, and would like that removed. She wasn't sure if the ward catheter was causing her symptoms or if she possibly had the flu. She has had not had a flu shot this year. She reports that her highest temp at home was 103.    Influenza  This is a new problem. Episode onset:  3 days ago. The problem occurs constantly. The problem has been unchanged. Associated symptoms include chills, coughing and myalgias. Pertinent negatives include no fever, nausea or vomiting. Nothing aggravates the symptoms. She has tried nothing for the symptoms.   Past Medical History:  Diagnosis Date  . Acne   . History of chlamydia infection   . Medical history non-contributory     Past Surgical History:  Procedure Laterality Date  . HERNIA REPAIR     umbilical    Family History  Problem Relation Age of Onset  . Diabetes Paternal Grandmother   . Asthma Sister   . Learning disabilities Sister   . Kidney disease Sister     dialysis    Social History  Substance Use Topics  . Smoking status: Never Smoker  . Smokeless tobacco: Never Used  . Alcohol use No    Allergies:  Allergies  Allergen Reactions  . Amoxicillin Hives  . Penicillins Hives    Has patient had a PCN reaction causing immediate rash, facial/tongue/throat swelling, SOB or lightheadedness with hypotension: Yes Has patient had a PCN reaction causing severe rash involving mucus membranes or skin necrosis: No Has patient had a PCN reaction that required hospitalization Yes Has patient had a PCN reaction occurring within the last 10 years: No If all of the above answers are "NO", then may proceed  with Cephalosporin use.    Prescriptions Prior to Admission  Medication Sig Dispense Refill Last Dose  . acetaminophen (TYLENOL) 500 MG tablet Take 500 mg by mouth every 6 (six) hours as needed.   11/27/2016 at Unknown time  . ibuprofen (ADVIL,MOTRIN) 600 MG tablet Take 1 tablet (600 mg total) by mouth every 6 (six) hours as needed. 30 tablet 0 11/27/2016 at Unknown time  . oxyCODONE-acetaminophen (PERCOCET/ROXICET) 5-325 MG tablet Take 1-2 tablets by mouth every 6 (six) hours as needed. 6 tablet 0 Past Week at Unknown time  . etonogestrel (NEXPLANON) 68 MG IMPL implant 1 each by Subdermal route once.   Continuous  . sulfamethoxazole-trimethoprim (BACTRIM DS,SEPTRA DS) 800-160 MG tablet Take 1 tablet by mouth 2 (two) times daily. 14 tablet 0 11/12/2016 at Unknown time    Review of Systems  Constitutional: Positive for chills. Negative for fever.  HENT: Positive for rhinorrhea and sinus pain.   Respiratory: Positive for cough.   Gastrointestinal: Negative for nausea and vomiting.  Musculoskeletal: Positive for myalgias.   Physical Exam   Blood pressure 125/70, pulse 91, temperature 99.2 F (37.3 C), temperature source Oral, resp. rate 16, SpO2 100 %, unknown if currently breastfeeding.  Physical Exam  Nursing note and vitals reviewed. Constitutional: She is oriented to person, place, and time. She appears well-developed and well-nourished. No distress.  HENT:  Head: Normocephalic.  Cardiovascular:  Normal rate.   Respiratory: Effort normal.  GI: Soft. There is no tenderness. There is no rebound.  Genitourinary:  Genitourinary Comments: Ward catheter removed without difficulty.  Bartholin's non-tender, no erythema or edema noted today.   Neurological: She is alert and oriented to person, place, and time.  Skin: Skin is warm and dry.  Psychiatric: She has a normal mood and affect.   Results for orders placed or performed during the hospital encounter of 11/27/16 (from the past 24  hour(s))  Influenza panel by PCR (type A & B)     Status: Abnormal   Collection Time: 11/27/16  9:53 PM  Result Value Ref Range   Influenza A By PCR NEGATIVE NEGATIVE   Influenza B By PCR POSITIVE (A) NEGATIVE    MAU Course  Procedures  MDM   Assessment and Plan   1. Influenza B    DC home Comfort measures reviewed  RX: none, patient with symptoms >48 hours Return to MAU as needed   Follow-up Information    [facility] Follow up.   Why:  if symptoms worsen  Contact information: [address] [id number]           Tawnya CrookHogan, Blandon Offerdahl Donovan 11/27/2016, 10:26 PM

## 2016-11-27 NOTE — Discharge Instructions (Signed)

## 2016-11-27 NOTE — Progress Notes (Signed)
Lab called and flu test is positive. Pt is on droplet precautions. Notified heather hogan cnm.

## 2016-11-27 NOTE — MAU Note (Signed)
Pt presents stating the drain from her cyst hasn't fallen out yet and she thinks she has the flu. States she wants the drain removed. States she started having symptoms yesterday including body aches, chills, cough and just feels bad. States she thinks it's because the catheter has been in too long.

## 2016-11-28 ENCOUNTER — Telehealth: Payer: Self-pay | Admitting: *Deleted

## 2016-11-28 ENCOUNTER — Other Ambulatory Visit: Payer: Self-pay | Admitting: Internal Medicine

## 2016-11-28 MED ORDER — BENZONATATE 200 MG PO CAPS: 200.0000 mg | ORAL_CAPSULE | Freq: Two times a day (BID) | ORAL | 0 refills | Status: DC | PRN

## 2016-11-28 NOTE — Progress Notes (Signed)
Please let patient know that I have sent in a prescription for Tessalon perles for her cough. She should read the After Visit Summary that was given to her at the MAU for things she can do at home to help with her symptoms.

## 2016-11-28 NOTE — Telephone Encounter (Signed)
Patient recently seen at MAU for influenza B (outside of the window for tamiflu). States now both of her children have flu and she is still feeling and coughing very badly. Wants to know if anything can be called in for cough and if there is anything else she can be doing to help herself feel better fatser so she can take care of sick children.

## 2016-11-29 NOTE — Progress Notes (Signed)
Pt informed and agreeable. Kan[place] Wanner Dawn, CMA   

## 2017-02-26 ENCOUNTER — Emergency Department (HOSPITAL_COMMUNITY): Payer: Medicaid Other

## 2017-02-26 ENCOUNTER — Emergency Department (HOSPITAL_COMMUNITY)
Admission: EM | Admit: 2017-02-26 | Discharge: 2017-02-26 | Disposition: A | Discharge status: Home / Self Care | Payer: Medicaid Other | Attending: Emergency Medicine | Admitting: Emergency Medicine

## 2017-02-26 ENCOUNTER — Encounter (HOSPITAL_COMMUNITY): Payer: Self-pay | Admitting: Emergency Medicine

## 2017-02-26 DIAGNOSIS — R0602 Shortness of breath: Secondary | ICD-10-CM | POA: Diagnosis present

## 2017-02-26 DIAGNOSIS — R062 Wheezing: Secondary | ICD-10-CM

## 2017-02-26 DIAGNOSIS — B349 Viral infection, unspecified: Secondary | ICD-10-CM | POA: Diagnosis present

## 2017-02-26 LAB — CBC WITH DIFFERENTIAL/PLATELET
Basophils Absolute: 0 10*3/uL (ref 0.0–0.1)
Basophils Relative: 0 %
EOS ABS: 0.2 10*3/uL (ref 0.0–0.7)
Eosinophils Relative: 2 %
HEMATOCRIT: 42.7 % (ref 36.0–46.0)
HEMOGLOBIN: 14.5 g/dL (ref 12.0–15.0)
LYMPHS ABS: 1.5 10*3/uL (ref 0.7–4.0)
LYMPHS PCT: 14 %
MCH: 28.4 pg (ref 26.0–34.0)
MCHC: 34 g/dL (ref 30.0–36.0)
MCV: 83.6 fL (ref 78.0–100.0)
MONOS PCT: 12 %
Monocytes Absolute: 1.3 10*3/uL — ABNORMAL HIGH (ref 0.1–1.0)
NEUTROS ABS: 7.9 10*3/uL — AB (ref 1.7–7.7)
NEUTROS PCT: 72 %
Platelets: 366 10*3/uL (ref 150–400)
RBC: 5.11 MIL/uL (ref 3.87–5.11)
RDW: 13.9 % (ref 11.5–15.5)
WBC: 10.8 10*3/uL — ABNORMAL HIGH (ref 4.0–10.5)

## 2017-02-26 LAB — I-STAT TROPONIN, ED: Troponin i, poc: 0 ng/mL (ref 0.00–0.08)

## 2017-02-26 LAB — D-DIMER, QUANTITATIVE (NOT AT ARMC): D DIMER QUANT: 0.3 ug{FEU}/mL (ref 0.00–0.50)

## 2017-02-26 LAB — BASIC METABOLIC PANEL
ANION GAP: 8 (ref 5–15)
BUN: 9 mg/dL (ref 6–20)
CALCIUM: 9.1 mg/dL (ref 8.9–10.3)
CHLORIDE: 108 mmol/L (ref 101–111)
CO2: 23 mmol/L (ref 22–32)
CREATININE: 0.65 mg/dL (ref 0.44–1.00)
GFR calc non Af Amer: >60 mL/min (ref >60)
Glucose, Bld: 93 mg/dL (ref 65–99)
Potassium: 3.3 mmol/L — ABNORMAL LOW (ref 3.5–5.1)
Sodium: 139 mmol/L (ref 135–145)

## 2017-02-26 LAB — I-STAT CG4 LACTIC ACID, ED: LACTIC ACID, VENOUS: 1.04 mmol/L (ref 0.5–1.9)

## 2017-02-26 LAB — PREGNANCY, URINE: PREG TEST UR: NEGATIVE

## 2017-02-26 MED ORDER — BENZONATATE 100 MG PO CAPS
100.0000 mg | ORAL_CAPSULE | Freq: Once | ORAL | Status: AC
  Administered 2017-02-26: 100 mg via ORAL
  Filled 2017-02-26: qty 1

## 2017-02-26 MED ORDER — AEROCHAMBER PLUS W/MASK MISC
1.0000 | Freq: Once | Status: AC
  Administered 2017-02-26: 1
  Filled 2017-02-26: qty 1

## 2017-02-26 MED ORDER — SODIUM CHLORIDE 0.9 % IV BOLUS (SEPSIS)
1000.0000 mL | Freq: Once | INTRAVENOUS | Status: AC
  Administered 2017-02-26: 1000 mL via INTRAVENOUS

## 2017-02-26 MED ORDER — PREDNISONE 20 MG PO TABS: 40.0000 mg | ORAL_TABLET | Freq: Every day | ORAL | 0 refills | Status: DC

## 2017-02-26 MED ORDER — IPRATROPIUM-ALBUTEROL 0.5-2.5 (3) MG/3ML IN SOLN: 3.0000 mL | Freq: Once | RESPIRATORY_TRACT | Status: DC

## 2017-02-26 MED ORDER — ALBUTEROL SULFATE (2.5 MG/3ML) 0.083% IN NEBU
5.0000 mg | INHALATION_SOLUTION | Freq: Once | RESPIRATORY_TRACT | Status: AC
  Administered 2017-02-26: 5 mg via RESPIRATORY_TRACT
  Filled 2017-02-26: qty 6

## 2017-02-26 MED ORDER — FLUTICASONE PROPIONATE 50 MCG/ACT NA SUSP: 2.0000 | Freq: Every day | NASAL | 12 refills | Status: DC

## 2017-02-26 MED ORDER — ALBUTEROL SULFATE HFA 108 (90 BASE) MCG/ACT IN AERS
2.0000 | INHALATION_SPRAY | Freq: Once | RESPIRATORY_TRACT | Status: AC
  Administered 2017-02-26: 2 via RESPIRATORY_TRACT
  Filled 2017-02-26: qty 6.7

## 2017-02-26 MED ORDER — ALBUTEROL SULFATE HFA 108 (90 BASE) MCG/ACT IN AERS: 1.0000 | INHALATION_SPRAY | Freq: Four times a day (QID) | RESPIRATORY_TRACT | 0 refills | Status: DC | PRN

## 2017-02-26 MED ORDER — IBUPROFEN 200 MG PO TABS
600.0000 mg | ORAL_TABLET | Freq: Once | ORAL | Status: AC
Start: 2017-02-26 — End: 2017-02-26
  Administered 2017-02-26: 600 mg via ORAL
  Filled 2017-02-26: qty 3

## 2017-02-26 MED ORDER — SODIUM CHLORIDE 0.9 % IV BOLUS (SEPSIS)
500.0000 mL | Freq: Once | INTRAVENOUS | Status: AC
  Administered 2017-02-26: 500 mL via INTRAVENOUS

## 2017-02-26 MED ORDER — IPRATROPIUM-ALBUTEROL 0.5-2.5 (3) MG/3ML IN SOLN
3.0000 mL | Freq: Once | RESPIRATORY_TRACT | Status: AC
  Administered 2017-02-26: 3 mL via RESPIRATORY_TRACT
  Filled 2017-02-26: qty 3

## 2017-02-26 MED ORDER — METHYLPREDNISOLONE SODIUM SUCC 125 MG IJ SOLR
125.0000 mg | Freq: Once | INTRAMUSCULAR | Status: AC
  Administered 2017-02-26: 125 mg via INTRAVENOUS
  Filled 2017-02-26: qty 2

## 2017-02-26 MED ORDER — BENZONATATE 100 MG PO CAPS: 100.0000 mg | ORAL_CAPSULE | Freq: Three times a day (TID) | ORAL | 0 refills | Status: DC

## 2017-02-26 NOTE — ED Provider Notes (Signed)
[facility]-EMERGENCY DEPT Provider Note   CSN: [id number] Arrival date & time: 02/26/17  1233     History   Chief Complaint Chief Complaint  Patient presents with  . Shortness of Breath  . Cough    HPI Peggy Mason is a 21 y.o. female.  HPI 21 year old African-American female with no significant past medical history presents to the emergency Department today with complaints of shortness of breath, nonproductive cough, fevers, wheezing, chest tightness that has been ongoing and gradually worsening for the past 2 days. The patient states that approximately 2 years ago she developed a nonproductive cough. She developed a temperature yesterday fever max of 102. Has taken Tylenol for her fever took at 10AM today. Patient states she has developed increasing worsening chest tightness and shortness of breath since her cough developed. She also complains of audible wheezing. Denies any history of asthma. States that she does have the Nexplanon. She denies any recent hospitalizations/surgeries, prolonged immobilizations, lower extremity edema or calf tenderness, tobacco use, history of DVT/PE. Patient denies any sick contacts. Take reports that patient was satting 87% on room air on arrival to the room. Denies any headaches, vision changes, lightheadedness, dizziness, abdominal pain, nausea, emesis, urinary symptoms, change in bowel habits, paresthesias. Past Medical History:  Diagnosis Date  . Acne   . History of chlamydia infection   . Medical history non-contributory     Patient Active Problem List   Diagnosis Date Noted  . Positive GBS test 04/25/2016  . Vaginal delivery 03/30/2016  . Vitamin D deficiency 08/15/2015  . Susceptible varicella--non-immune 08/15/2015  . Rh negative, maternal - received Rhophylac on 08/04/15 due to spotting 08/14/2015  . Rubella non-immune status, antepartum 08/14/2015  . Allergy to penicillin - rash, hives -- required hospitalization 08/14/2015  . Allergy to  amoxicillin -- rash, hives 08/14/2015    Past Surgical History:  Procedure Laterality Date  . HERNIA REPAIR     umbilical    OB History    Gravida Para Term Preterm AB Living   2 2 2  0 0 2   SAB TAB Ectopic Multiple Live Births   0 0 0 0 2       Home Medications    Prior to Admission medications   Medication Sig Start Date End Date Taking? Authorizing Provider  acetaminophen (TYLENOL) 500 MG tablet Take 500 mg by mouth every 6 (six) hours as needed.   Yes [provider]  etonogestrel (NEXPLANON) 68 MG IMPL implant 1 each by Subdermal route once.   Yes [provider]  ibuprofen (ADVIL,MOTRIN) 600 MG tablet Take 1 tablet (600 mg total) by mouth every 6 (six) hours as needed. 11/09/16  Yes Leftwich-Kirby, Wilmer FloorLisa A, CNM  benzonatate (TESSALON) 200 MG capsule Take 1 capsule (200 mg total) by mouth 2 (two) times daily as needed for cough. Patient not taking: Reported on 02/26/2017 11/28/16   Marquette SaaLancaster, Abigail Joseph, MD  oxyCODONE-acetaminophen (PERCOCET/ROXICET) 5-325 MG tablet Take 1-2 tablets by mouth every 6 (six) hours as needed. Patient not taking: Reported on 02/26/2017 11/09/16   Hurshel PartyLeftwich-Kirby, Lisa A, CNM    Family History Family History  Problem Relation Age of Onset  . Diabetes Paternal Grandmother   . Asthma Sister   . Learning disabilities Sister   . Kidney disease Sister        dialysis    Social History Social History  Substance Use Topics  . Smoking status: Never Smoker  . Smokeless tobacco: Never Used  . Alcohol use  No     Allergies   Amoxicillin and Penicillins   Review of Systems Review of Systems  Constitutional: Positive for fever (max 102). Negative for chills.  HENT: Positive for congestion. Negative for ear pain and sore throat.   Eyes: Negative for visual disturbance.  Respiratory: Positive for cough, shortness of breath and wheezing.   Cardiovascular: Negative for chest pain, palpitations and leg swelling.    Gastrointestinal: Negative for abdominal pain, diarrhea, nausea and vomiting.  Genitourinary: Negative for dysuria, flank pain, frequency, hematuria and urgency.  Musculoskeletal: Negative for back pain.  Skin: Negative.   Neurological: Negative for dizziness, syncope, weakness, light-headedness, numbness and headaches.     Physical Exam Updated Vital Signs BP (!) 111/95 (BP Location: Left Arm)   Pulse (!) 112   Temp 98.7 F (37.1 C) (Oral)   Resp 20   Ht 5\' 3"  (1.6 m)   Wt 47.6 kg   SpO2 100%   BMI 18.60 kg/m   Physical Exam  Constitutional: She is oriented to person, place, and time. She appears well-developed and well-nourished. No distress.  HENT:  Head: Normocephalic and atraumatic.  Right Ear: Tympanic membrane, external ear and ear canal normal.  Left Ear: Tympanic membrane, external ear and ear canal normal.  Nose: Nose normal.  Mouth/Throat: Uvula is midline, oropharynx is clear and moist and mucous membranes are normal.  Eyes: Conjunctivae are normal. Right eye exhibits no discharge. Left eye exhibits no discharge. No scleral icterus.  Neck: Normal range of motion. Neck supple. No JVD present. No tracheal deviation present. No thyromegaly present.  Cardiovascular: Regular rhythm, normal heart sounds and intact distal pulses.   Tachycardia noted.  Pulmonary/Chest: Effort normal and breath sounds normal.  Initially hypoxic 87%. Satting at 90% on room air this time. Patient is to Make. Bilateral expiratory and expiratory wheezes noted. Mild increased work of breathing. No retractions noted.  Abdominal: Soft. Bowel sounds are normal. She exhibits no distension. There is no tenderness. There is no rebound and no guarding.  Musculoskeletal: Normal range of motion.  Lymphadenopathy:    She has no cervical adenopathy.  Neurological: She is alert and oriented to person, place, and time.  Skin: Skin is warm and dry. Capillary refill takes less than 2 seconds.  Nursing note  and vitals reviewed.    ED Treatments / Results  Labs (all labs ordered are listed, but only abnormal results are displayed) Labs Reviewed  BASIC METABOLIC PANEL - Abnormal; Notable for the following:       Result Value   Potassium 3.3 (*)    All other components within normal limits  CBC WITH DIFFERENTIAL/PLATELET - Abnormal; Notable for the following:    WBC 10.8 (*)    Neutro Abs 7.9 (*)    Monocytes Absolute 1.3 (*)    All other components within normal limits  D-DIMER, QUANTITATIVE (NOT AT [facility])  PREGNANCY, URINE  I-STAT TROPOININ, ED  I-STAT CG4 LACTIC ACID, ED    EKG  EKG Interpretation None       Radiology Dg Chest 2 View  Result Date: 02/26/2017 CLINICAL DATA:  Shortness of breath EXAM: CHEST  2 VIEW COMPARISON:  09/16/2009 FINDINGS: Normal heart size and mediastinal contours. Borderline airway thickening. No acute infiltrate or edema. No effusion or pneumothorax. No acute osseous findings. IMPRESSION: Negative chest. Electronically Signed   By: Marnee Spring M.D.   On: 02/26/2017 12:57    Procedures Procedures (including critical care time)  Medications Ordered in ED Medications  albuterol (PROVENTIL) (2.5 MG/3ML) 0.083% nebulizer solution 5 mg (5 mg Nebulization Given 02/26/17 1324)  methylPREDNISolone sodium succinate (SOLU-MEDROL) 125 mg/2 mL injection 125 mg (125 mg Intravenous Given 02/26/17 1502)  benzonatate (TESSALON) capsule 100 mg (100 mg Oral Given 02/26/17 1451)  ibuprofen (ADVIL,MOTRIN) tablet 600 mg (600 mg Oral Given 02/26/17 1452)  sodium chloride 0.9 % bolus 1,000 mL (0 mLs Intravenous Stopped 02/26/17 1632)  ipratropium-albuterol (DUONEB) 0.5-2.5 (3) MG/3ML nebulizer solution 3 mL (3 mLs Nebulization Given 02/26/17 1628)  sodium chloride 0.9 % bolus 500 mL (0 mLs Intravenous Stopped 02/26/17 1956)  albuterol (PROVENTIL HFA;VENTOLIN HFA) 108 (90 Base) MCG/ACT inhaler 2 puff (2 puffs Inhalation Given 02/26/17 1951)  aerochamber plus with mask  device 1 each (1 each Other Given 02/26/17 1951)     Initial Impression / Assessment and Plan / ED Course  I have reviewed the triage vital signs and the nursing notes.  Pertinent labs & imaging results that were available during my care of the patient were reviewed by me and considered in my medical decision making (see chart for details).     Patient presents to the emergency Department with complaints of chest tightness, shortness of breath, cough, fevers that have been gradually worsening over the past 2 days. Patient initially hypoxic at 87% on room air and patient with tachypnea. Patient is also tachycardic to 126. Afebrile. Lung exam reveals bilateral desultory 20 wheezes with mild increase work of breathing.  Labs reveal normal lactic acid. Normal white count. At this time I suspicion for PE given tachypnea, hypoxia, tachycardia. Risk factors include nexplanon. Awaiting d-dimer and troponin at this time. EKG is pending. Chest x-ray is unremarkable.  The patient's lab work has been reassuring. Mild hypokalemia noted. D-dimer and troponin were negative. EKG shows no acute changes.patient continued to be persistently tachycardic. He was given 1.5 fluid liter boluses with improvement in her tachycardia. The patient was given 2 DuoNeb's with improvement in her lung sounds. She was able to ambulate with sats stayed above 95%. Patient does appear to be tachypnea with ambulation. Feel the patient's symptoms are likely due to a viral  respiratory infection. The patient's son has same symptoms. Low suspicion for PE given the negative d-dimer. Given patient's persistent tachypnea felt that admission may be warranted. Spoke with Dr. Ophelia Charter with hospital medicine for admission. She states she will come to the ED to evaluate patient with likely discharge home. Patient was seen by Dr. Ophelia Charter in the ED who feels the patient can be treated on an outpatient setting.the patient does feel much improved after  steroids and breathing treatments. She again is able to ambulate with sats above 95% with improvement in her tachypnea and tachycardia. Feels ready for discharge. We'll discharge home with 4 day burst of prednisone along with albuterol inhaler, Flonase, cough medicine. The patient has been given strict return precautions. All the patient's questions were answered prior to discharge. She is hemodynamically stable and in no acute distress on discharge. Patient was seen and evaluated by Dr. Madilyn Hook who is agreeable to above plan. No further emergent medical management is necessary at this time.  Final Clinical Impressions(s) / ED Diagnoses   Final diagnoses:  Shortness of breath  Wheezing    New Prescriptions New Prescriptions   No medications on file     Wallace Keller 02/27/17 0105    Tilden Fossa, MD 02/27/17 1145

## 2017-02-26 NOTE — ED Notes (Signed)
ADMISSION RN PRESENT WITH PT

## 2017-02-26 NOTE — ED Notes (Signed)
EDPA Provider at bedside. 

## 2017-02-26 NOTE — ED Notes (Signed)
Bed: WA12 Expected date:  Expected time:  Means of arrival:  Comments: Hold for triage 

## 2017-02-26 NOTE — Discharge Instructions (Signed)
Please make sure you take the prednisone starting tomorrow for the next 4 days. Use the albuterol inhaler with spacer 2 puffs every 2-4 hours as needed. He is a PsychiatristTessalon for cough. May use the Flonase for nasal congestion. Patient to follow-up with the primary care doctor next week for follow-up. Return to the ED if he developed any worsening breathing, worsening chest pain, or for any reason.

## 2017-02-26 NOTE — ED Notes (Signed)
Patient transported to X-ray 

## 2017-02-26 NOTE — ED Triage Notes (Signed)
Pt c/o SOB x 2 days, non-productive cough, pt c/o fevers at home as high as 102 and taken Tylenol. Pt states SOB has worsened today and feels tightness in chest. Pt with decreased lung sounds bilaterally and expiratory wheezing in triage. Non-productive cough.

## 2017-02-26 NOTE — ED Notes (Signed)
ED Provider at bedside. 

## 2017-02-26 NOTE — ED Notes (Signed)
PT AMBULATED. PT STATED SLIGHT SOB. 02 SATS 95-99% RA, RR 27-32, HR 130-140. PT RECOVERED IMMEDIATELY WHEN BACK TO STRETCHER.

## 2017-02-26 NOTE — ED Notes (Signed)
MADE FIRST REQUEST FOR A URINE SAMPLE 

## 2017-02-26 NOTE — ED Notes (Signed)
RRT DEE AT BEDSIDE 

## 2017-02-26 NOTE — Consult Note (Signed)
Consult Note   Peggy Mason UJW:[id number] DOB: [birth year] DOA: 02/26/2017  PCP: [facility] Consultants:  None Patient coming from: home - lives with 2 children; NOK: parents, [phone]; [phone]  Chief Complaint: wheezing, cough  HPI: Peggy Mason is a 21 y.o. female with no significant medical history presenting with apparent wheezing-associated respiratory infection.  Sunday, it was very hot and her cousin threw a Mother's Day cookout.  When she went back into the house, it was hard to breathe, even lying down.  Once home, she relaxed and things improved.  Yesterday, she had a cough.  It got worse about 6-7pm and it was hard to breathe.  Coughing made it unbearable.  She developed post-tussive emesis.  Fever to 102.3.  Ongoing n/v.  Minimal PO intake.  Chest continues to feel very tight and it radiates into her back when coughing and trying to breathe.  Also with myalgias.  Denies h/o asthma or wheezing in the past, although her sister does have asthma.    ED Course: Initial hypoxia but resolved after nebs.  Normal evaluation.  Presumed viral syndrome with wheezing.  Review of Systems: As per HPI; otherwise review of systems reviewed and negative.   Ambulatory Status:  ambulates without assistance  Past Medical History:  Diagnosis Date  . Acne   . History of chlamydia infection     Past Surgical History:  Procedure Laterality Date  . HERNIA REPAIR     umbilical    Social History   Social History  . Marital status: Single    Spouse name: N/A  . Number of children: N/A  . Years of education: N/A   Occupational History  . Not on file.   Social History Main Topics  . Smoking status: Current Every Day Smoker    Types: Cigars  . Smokeless tobacco: Never Used     Comment:  1 to 3 black milds per day - has not smoked the last few days  . Alcohol use No  . Drug use: Yes    Types: Marijuana     Comment: last use 02/23/17  . Sexual activity: Yes      Birth control/ protection: Implant     Comment: implant removed march 2016   Other Topics Concern  . Not on file   Social History Narrative  . No narrative on file    Allergies  Allergen Reactions  . Amoxicillin Hives  . Penicillins Hives    Has patient had a PCN reaction causing immediate rash, facial/tongue/throat swelling, SOB or lightheadedness with hypotension: Yes Has patient had a PCN reaction causing severe rash involving mucus membranes or skin necrosis: No Has patient had a PCN reaction that required hospitalization Yes Has patient had a PCN reaction occurring within the last 10 years: No If all of the above answers are "NO", then may proceed with Cephalosporin use.    Family History  Problem Relation Age of Onset  . Diabetes Paternal Grandmother   . Asthma Sister   . Learning disabilities Sister   . Kidney disease Sister        dialysis    Prior to Admission medications   Medication Sig Start Date End Date Taking? Authorizing Provider  acetaminophen (TYLENOL) 500 MG tablet Take 500 mg by mouth every 6 (six) hours as needed.   Yes [provider]  etonogestrel (NEXPLANON) 68 MG IMPL implant 1 each by Subdermal route once.   Yes [provider]  ibuprofen (  ADVIL,MOTRIN) 600 MG tablet Take 1 tablet (600 mg total) by mouth every 6 (six) hours as needed. 11/09/16  Yes Leftwich-Kirby, Wilmer Floor, CNM  benzonatate (TESSALON) 200 MG capsule Take 1 capsule (200 mg total) by mouth 2 (two) times daily as needed for cough. Patient not taking: Reported on 02/26/2017 11/28/16   Marquette Saa, MD  oxyCODONE-acetaminophen (PERCOCET/ROXICET) 5-325 MG tablet Take 1-2 tablets by mouth every 6 (six) hours as needed. Patient not taking: Reported on 02/26/2017 11/09/16   Hurshel Party, CNM    Physical Exam: Vitals:   02/26/17 1830 02/26/17 1900 02/26/17 1930 02/26/17 1951  BP: 131/70 123/74 126/79 137/79  Pulse:    95  Resp: (!) 22 20 16 15   Temp:       TempSrc:      SpO2: 99% 98% 99% 100%  Weight:      Height:         General: Appears calm and comfortable and is NAD Eyes:  PERRL, EOMI, normal lids, iris ENT:  grossly normal hearing, lips & tongue, mmm Neck:  no LAD, masses or thyromegaly Cardiovascular:  Tachycardia to low 100s, no m/r/g. No LE edema.  Respiratory: Diffuse wheezing but with moderate air movement.  Able to have an extensive conversation without obvious dyspnea. Abdomen:  soft, mildly diffusely tender from coughing, nd, NABS Skin:  no rash or induration seen on limited exam Musculoskeletal:  grossly normal tone BUE/BLE, good ROM, no bony abnormality Psychiatric:  grossly normal mood and affect, speech fluent and appropriate, AOx3 Neurologic:  CN 2-12 grossly intact, moves all extremities in coordinated fashion, sensation intact  Labs on Admission: I have personally reviewed following labs and imaging studies  CBC:  Recent Labs Lab 02/26/17 1319  WBC 10.8*  NEUTROABS 7.9*  HGB 14.5  HCT 42.7  MCV 83.6  PLT 366   Basic Metabolic Panel:  Recent Labs Lab 02/26/17 1319  NA 139  K 3.3*  CL 108  CO2 23  GLUCOSE 93  BUN 9  CREATININE 0.65  CALCIUM 9.1   GFR: Estimated Creatinine Clearance: 82.9 mL/min (by C-G formula based on SCr of 0.65 mg/dL). Liver Function Tests: No results for input(s): AST, ALT, ALKPHOS, BILITOT, PROT, ALBUMIN in the last 168 hours. No results for input(s): LIPASE, AMYLASE in the last 168 hours. No results for input(s): AMMONIA in the last 168 hours. Coagulation Profile: No results for input(s): INR, PROTIME in the last 168 hours. Cardiac Enzymes: No results for input(s): CKTOTAL, CKMB, CKMBINDEX, TROPONINI in the last 168 hours. BNP (last 3 results) No results for input(s): PROBNP in the last 8760 hours. HbA1C: No results for input(s): HGBA1C in the last 72 hours. CBG: No results for input(s): GLUCAP in the last 168 hours. Lipid Profile: No results for input(s):  CHOL, HDL, LDLCALC, TRIG, CHOLHDL, LDLDIRECT in the last 72 hours. Thyroid Function Tests: No results for input(s): TSH, T4TOTAL, FREET4, T3FREE, THYROIDAB in the last 72 hours. Anemia Panel: No results for input(s): VITAMINB12, FOLATE, FERRITIN, TIBC, IRON, RETICCTPCT in the last 72 hours. Urine analysis:    Component Value Date/Time   COLORURINE YELLOW 11/13/2016 1522   APPEARANCEUR HAZY (A) 11/13/2016 1522   LABSPEC 1.026 11/13/2016 1522   PHURINE 5.0 11/13/2016 1522   GLUCOSEU NEGATIVE 11/13/2016 1522   HGBUR NEGATIVE 11/13/2016 1522   HGBUR large 05/04/2009 1556   BILIRUBINUR NEGATIVE 11/13/2016 1522   BILIRUBINUR NEG 01/27/2012 0952   KETONESUR NEGATIVE 11/13/2016 1522   PROTEINUR 30 (A) 11/13/2016 1522  UROBILINOGEN 4.0 (H) 08/15/2015 1845   NITRITE NEGATIVE 11/13/2016 1522   LEUKOCYTESUR SMALL (A) 11/13/2016 1522    Creatinine Clearance: Estimated Creatinine Clearance: 82.9 mL/min (by C-G formula based on SCr of 0.65 mg/dL).  Sepsis Labs: @LABRCNTIP (procalcitonin:4,lacticidven:4) )No results found for this or any previous visit (from the past 240 hour(s)).   Radiological Exams on Admission: Dg Chest 2 View  Result Date: 02/26/2017 CLINICAL DATA:  Shortness of breath EXAM: CHEST  2 VIEW COMPARISON:  09/16/2009 FINDINGS: Normal heart size and mediastinal contours. Borderline airway thickening. No acute infiltrate or edema. No effusion or pneumothorax. No acute osseous findings. IMPRESSION: Negative chest. Electronically Signed   By: Marnee SpringJonathon  Watts M.D.   On: 02/26/2017 12:57    EKG: pending  Assessment/Plan Principal Problem:   Acute viral syndrome   -Patient with essentially negative work-up presenting with apparent viral syndrome - fever, cough, post-tussive emesis, wheezing. -No prior h/o wheezing/asthma -Benign PE -Offered the patient the choice of overnight observation or discharge to home with the same therapy and she chooses outpatient management. -Nebs  or MDI, steroids, Tylenol/Motrin, push fluids. -RTC or ER with worsening symptoms. -Based on patient's desire to go home, it is reasonable to discharge with planned outpatient f/u.   Thank you for this consultation.   Jonah BlueJennifer Caidynce Muzyka MD [facility]  If 7PM-7AM, please contact night-coverage [email or web address] Password [email or web address]  02/26/2017, 11:47 PM

## 2017-02-26 NOTE — ED Notes (Signed)
PATIENT WALKED AROUND THE ED FLOOR HEART RATE 115-130 O2STAT 96-100 %

## 2017-12-24 ENCOUNTER — Encounter (HOSPITAL_BASED_OUTPATIENT_CLINIC_OR_DEPARTMENT_OTHER): Payer: Self-pay

## 2017-12-24 ENCOUNTER — Emergency Department (HOSPITAL_BASED_OUTPATIENT_CLINIC_OR_DEPARTMENT_OTHER)
Admission: EM | Admit: 2017-12-24 | Discharge: 2017-12-24 | Disposition: A | Discharge status: Home / Self Care | Payer: Self-pay | Attending: Emergency Medicine | Admitting: Emergency Medicine

## 2017-12-24 ENCOUNTER — Other Ambulatory Visit: Payer: Self-pay

## 2017-12-24 DIAGNOSIS — R197 Diarrhea, unspecified: Secondary | ICD-10-CM | POA: Insufficient documentation

## 2017-12-24 DIAGNOSIS — F17290 Nicotine dependence, other tobacco product, uncomplicated: Secondary | ICD-10-CM | POA: Insufficient documentation

## 2017-12-24 DIAGNOSIS — R112 Nausea with vomiting, unspecified: Secondary | ICD-10-CM | POA: Insufficient documentation

## 2017-12-24 LAB — URINALYSIS, ROUTINE W REFLEX MICROSCOPIC
BILIRUBIN URINE: NEGATIVE
Glucose, UA: NEGATIVE mg/dL
KETONES UR: 15 mg/dL — AB
Leukocytes, UA: NEGATIVE
NITRITE: NEGATIVE
PROTEIN: NEGATIVE mg/dL
Specific Gravity, Urine: 1.015 (ref 1.005–1.030)
pH: 7.5 (ref 5.0–8.0)

## 2017-12-24 LAB — URINALYSIS, MICROSCOPIC (REFLEX)

## 2017-12-24 LAB — PREGNANCY, URINE: PREG TEST UR: NEGATIVE

## 2017-12-24 MED ORDER — ONDANSETRON HCL 4 MG/2ML IJ SOLN
4.0000 mg | Freq: Once | INTRAMUSCULAR | Status: AC
  Administered 2017-12-24: 4 mg via INTRAVENOUS
  Filled 2017-12-24: qty 2

## 2017-12-24 MED ORDER — SODIUM CHLORIDE 0.9 % IV BOLUS (SEPSIS)
1000.0000 mL | Freq: Once | INTRAVENOUS | Status: AC
Start: 2017-12-24 — End: 2017-12-24
  Administered 2017-12-24: 1000 mL via INTRAVENOUS

## 2017-12-24 MED ORDER — SODIUM CHLORIDE 0.9 % IV BOLUS (SEPSIS)
1000.0000 mL | Freq: Once | INTRAVENOUS | Status: AC
  Administered 2017-12-24: 1000 mL via INTRAVENOUS

## 2017-12-24 MED ORDER — ONDANSETRON 4 MG PO TBDP: 4.0000 mg | ORAL_TABLET | Freq: Three times a day (TID) | ORAL | 0 refills | Status: DC | PRN

## 2017-12-24 NOTE — ED Provider Notes (Signed)
[facility] EMERGENCY DEPARTMENT Provider Note   CSN: [id number] Arrival date & time: 12/24/17  1050     History   Chief Complaint Chief Complaint  Patient presents with  . Abdominal Pain    HPI Peggy Mason is a 22 y.o. female.  HPI Patient presents with nausea vomiting diarrhea and abdominal pain.  Began last night.  States anything she tries to be comes back up.  Patient is still most of her abdomen.  States she ate some hot wings last night.  No fevers or chills.  States she does feel bad all over.  No known sick contacts. Past Medical History:  Diagnosis Date  . Acne   . History of chlamydia infection     Patient Active Problem List   Diagnosis Date Noted  . Acute viral syndrome 02/26/2017  . Positive GBS test 04/25/2016  . Vaginal delivery 03/30/2016  . Vitamin D deficiency 08/15/2015  . Susceptible varicella--non-immune 08/15/2015  . Rh negative, maternal - received Rhophylac on 08/04/15 due to spotting 08/14/2015  . Rubella non-immune status, antepartum 08/14/2015  . Allergy to penicillin - rash, hives -- required hospitalization 08/14/2015  . Allergy to amoxicillin -- rash, hives 08/14/2015    Past Surgical History:  Procedure Laterality Date  . HERNIA REPAIR     umbilical    OB History    Gravida Para Term Preterm AB Living   2 2 2  0 0 2   SAB TAB Ectopic Multiple Live Births   0 0 0 0 2       Home Medications    Prior to Admission medications   Medication Sig Start Date End Date Taking? Authorizing Provider  ondansetron (ZOFRAN-ODT) 4 MG disintegrating tablet Take 1 tablet (4 mg total) by mouth every 8 (eight) hours as needed for nausea or vomiting. 12/24/17   Benjiman CorePickering, Niel Peretti, MD    Family History Family History  Problem Relation Age of Onset  . Diabetes Paternal Grandmother   . Asthma Sister   . Learning disabilities Sister   . Kidney disease Sister        dialysis    Social History Social History   Tobacco Use  .  Smoking status: Current Every Day Smoker    Types: Cigars  . Smokeless tobacco: Never Used  . Tobacco comment:  1 to 3 black milds per day - has not smoked the last few days  Substance Use Topics  . Alcohol use: Yes    Comment: occ  . Drug use: Yes    Types: Marijuana     Allergies   Amoxicillin and Penicillins   Review of Systems Review of Systems  Constitutional: Positive for appetite change and fatigue. Negative for fever.  HENT: Negative for congestion.   Respiratory: Negative for shortness of breath.   Cardiovascular: Negative for chest pain.  Gastrointestinal: Positive for abdominal pain, diarrhea, nausea and vomiting.  Genitourinary: Negative for dysuria.  Musculoskeletal: Negative for back pain.  Neurological: Negative for numbness.  Hematological: Negative for adenopathy.  Psychiatric/Behavioral: Negative for confusion.     Physical Exam Updated Vital Signs BP (!) 98/59 (BP Location: Left Arm)   Pulse 62   Temp 98.1 F (36.7 C) (Oral)   Resp 18   Ht 5\' 3"  (1.6 m)   Wt 87 kg (191 lb 12.8 oz)   LMP 12/16/2017   SpO2 100%   BMI 33.98 kg/m   Physical Exam  Constitutional: She appears well-developed.  HENT:  Head: Normocephalic.  Eyes: EOM are normal.  Cardiovascular: Normal rate.  Pulmonary/Chest: Breath sounds normal.  Abdominal: Normal appearance.  Mild diffuse abdominal tenderness without rebound or guarding.  No hernia palpated.  Neurological: She is alert.  Skin: Skin is warm. Capillary refill takes less than 2 seconds.  Psychiatric: She has a normal mood and affect.     ED Treatments / Results  Labs (all labs ordered are listed, but only abnormal results are displayed) Labs Reviewed  URINALYSIS, ROUTINE W REFLEX MICROSCOPIC - Abnormal; Notable for the following components:      Result Value   Hgb urine dipstick TRACE (*)    Ketones, ur 15 (*)    All other components within normal limits  URINALYSIS, MICROSCOPIC (REFLEX) - Abnormal;  Notable for the following components:   Bacteria, UA FEW (*)    Squamous Epithelial / LPF 0-5 (*)    All other components within normal limits  PREGNANCY, URINE    EKG  EKG Interpretation None       Radiology No results found.  Procedures Procedures (including critical care time)  Medications Ordered in ED Medications  sodium chloride 0.9 % bolus 1,000 mL (not administered)  sodium chloride 0.9 % bolus 1,000 mL (0 mLs Intravenous Stopped 12/24/17 1506)  ondansetron (ZOFRAN) injection 4 mg (4 mg Intravenous Given 12/24/17 1405)     Initial Impression / Assessment and Plan / ED Course  I have reviewed the triage vital signs and the nursing notes.  Pertinent labs & imaging results that were available during my care of the patient were reviewed by me and considered in my medical decision making (see chart for details).     Patient with nausea vomiting diarrhea.  Benign abdominal exam.  Urinalysis reassuring for dehydration or infection.  Not pregnant.  Giving patient oral trial but likely discharge home.  Care turned over to Dr. Ranae Palms.  Final Clinical Impressions(s) / ED Diagnoses   Final diagnoses:  Nausea vomiting and diarrhea    ED Discharge Orders        Ordered    ondansetron (ZOFRAN-ODT) 4 MG disintegrating tablet  Every 8 hours PRN     12/24/17 1646       Benjiman Core, MD 12/24/17 1647

## 2017-12-24 NOTE — ED Notes (Signed)
NAD at this time. Pt is stable and going home.  

## 2017-12-24 NOTE — ED Triage Notes (Signed)
C/o abd pain, n/v/d-started last night-NAD-steady gait 

## 2018-04-11 ENCOUNTER — Emergency Department (HOSPITAL_COMMUNITY)
Admission: EM | Admit: 2018-04-11 | Discharge: 2018-04-11 | Disposition: A | Discharge status: Home / Self Care | Payer: BLUE CROSS/BLUE SHIELD | Attending: Emergency Medicine | Admitting: Emergency Medicine

## 2018-04-11 ENCOUNTER — Encounter (HOSPITAL_COMMUNITY): Payer: Self-pay | Admitting: Emergency Medicine

## 2018-04-11 ENCOUNTER — Other Ambulatory Visit: Payer: Self-pay

## 2018-04-11 DIAGNOSIS — R6884 Jaw pain: Secondary | ICD-10-CM | POA: Insufficient documentation

## 2018-04-11 DIAGNOSIS — F17290 Nicotine dependence, other tobacco product, uncomplicated: Secondary | ICD-10-CM | POA: Insufficient documentation

## 2018-04-11 MED ORDER — METHOCARBAMOL 500 MG PO TABS: 500.0000 mg | ORAL_TABLET | Freq: Three times a day (TID) | ORAL | 0 refills | Status: DC | PRN

## 2018-04-11 MED ORDER — NAPROXEN 500 MG PO TABS: 500.0000 mg | ORAL_TABLET | Freq: Two times a day (BID) | ORAL | 0 refills | Status: DC

## 2018-04-11 MED ORDER — CLINDAMYCIN HCL 150 MG PO CAPS: 300.0000 mg | ORAL_CAPSULE | Freq: Three times a day (TID) | ORAL | 0 refills | Status: DC

## 2018-04-11 NOTE — ED Provider Notes (Signed)
[facility]   CSN: [id number] Arrival date & time: 04/11/18  2115     History   Chief Complaint Chief Complaint  Patient presents with  . Jaw Pain    HPI Peggy Mason is a 22 y.o. female with a hx of tobacco abuse who presents to the ED with complaints of jaw pain x 2 weeks. Patient describes the pain as being to her L jaw, radiating to dental area and to the ear. Pain is present fairly constantly but is worse with opening of the mouth and with eating. She has been taking tylenol, motrin, and ketorolac with some improvement, but not resolution- these meds were prescribed by her gyn doctor for other problems that she had leftover. She states that she does have a cracked tooth in L upper gumline.She has had some nausea after taking some of the meds described above, no other nausea, no vomiting. Denies fever, chills, trismus, drooling, inability to swallow. She is not currently breast feeding. No trauma.   HPI  Past Medical History:  Diagnosis Date  . Acne   . History of chlamydia infection     Patient Active Problem List   Diagnosis Date Noted  . Acute viral syndrome 02/26/2017  . Positive GBS test 04/25/2016  . Vaginal delivery 03/30/2016  . Vitamin D deficiency 08/15/2015  . Susceptible varicella--non-immune 08/15/2015  . Rh negative, maternal - received Rhophylac on 08/04/15 due to spotting 08/14/2015  . Rubella non-immune status, antepartum 08/14/2015  . Allergy to penicillin - rash, hives -- required hospitalization 08/14/2015  . Allergy to amoxicillin -- rash, hives 08/14/2015    Past Surgical History:  Procedure Laterality Date  . HERNIA REPAIR     umbilical     OB History    Gravida  2   Para  2   Term  2   Preterm  0   AB  0   Living  2     SAB  0   TAB  0   Ectopic  0   Multiple  0   Live Births  2            Home Medications    Prior to Admission medications   Medication Sig  Start Date End Date Taking? Authorizing Provider  ondansetron (ZOFRAN-ODT) 4 MG disintegrating tablet Take 1 tablet (4 mg total) by mouth every 8 (eight) hours as needed for nausea or vomiting. 12/24/17   Benjiman CorePickering, Nathan, MD    Family History Family History  Problem Relation Age of Onset  . Diabetes Paternal Grandmother   . Asthma Sister   . Learning disabilities Sister   . Kidney disease Sister        dialysis    Social History Social History   Tobacco Use  . Smoking status: Current Every Day Smoker    Types: Cigars  . Smokeless tobacco: Never Used  . Tobacco comment:  1 to 3 black milds per day - has not smoked the last few days  Substance Use Topics  . Alcohol use: Yes    Comment: occ  . Drug use: Yes    Types: Marijuana     Allergies   Amoxicillin and Penicillins   Review of Systems Review of Systems  Constitutional: Negative for chills and fever.  HENT: Positive for dental problem and ear pain. Negative for congestion, rhinorrhea, sore throat, trouble swallowing and voice change.        Positive for jaw  pain  Respiratory: Negative for shortness of breath.   Cardiovascular: Negative for chest pain.  Gastrointestinal: Positive for nausea. Negative for abdominal pain, constipation, diarrhea and vomiting.    Physical Exam Updated Vital Signs BP 118/80 (BP Location: Right Arm)   Pulse 60   Temp 98.8 F (37.1 C) (Oral)   Resp 18   Ht 5\' 3"  (1.6 m)   Wt 86.2 kg (190 lb)   LMP 03/23/2018 (Approximate)   SpO2 99%   BMI 33.66 kg/m   Physical Exam  Constitutional: She appears well-developed and well-nourished.  Non-toxic appearance. No distress.  HENT:  Head: Normocephalic and atraumatic.  Right Ear: Tympanic membrane normal. No drainage or swelling. No mastoid tenderness. Tympanic membrane is not perforated, not erythematous, not retracted and not bulging.  Left Ear: Tympanic membrane normal. No drainage or swelling. No mastoid tenderness. Tympanic membrane  is not perforated, not erythematous, not retracted and not bulging.  Nose: Nose normal.  Mouth/Throat: Uvula is midline. No oropharyngeal exudate, posterior oropharyngeal erythema or tonsillar abscesses.    No mastoid erythema or warmth.  Patient is tolerating her own secretions without difficulty.  No trismus.  No drooling.  No hot potato voice.  Submandibular compartment is soft.  Patient is tender to palpation over her left TMJ region, no palpable crepitus.  No appreciable swelling on exam.  Eyes: Conjunctivae are normal. Right eye exhibits no discharge. Left eye exhibits no discharge.  Neck: Normal range of motion and full passive range of motion without pain. Neck supple. No neck rigidity.  Cardiovascular: Normal rate and regular rhythm.  No murmur heard. Pulmonary/Chest: Effort normal and breath sounds normal. No respiratory distress.  Abdominal: Soft. She exhibits no distension. There is no tenderness.  Lymphadenopathy:    She has no cervical adenopathy.  Neurological: She is alert.  Psychiatric: She has a normal mood and affect. Her behavior is normal. Thought content normal.  Nursing note and vitals reviewed.    ED Treatments / Results  Labs (all labs ordered are listed, but only abnormal results are displayed) Labs Reviewed - No data to display  EKG None  Radiology No results found.  Procedures Procedures (including critical care time)  Medications Ordered in ED Medications - No data to display   Initial Impression / Assessment and Plan / ED Course  I have reviewed the triage vital signs and the nursing notes.  Pertinent labs & imaging results that were available during my care of the patient were reviewed by me and considered in my medical decision making (see chart for details).   Patient presents with left-sided jaw pain.  Patient nontoxic-appearing, in no apparent distress, vitals WNL.  Query TMJ versus dental etiology  No gross abscess.  Exam unconcerning  for Peggy's angina or spread of infection. Ear exam benign. Will treat with naproxen/robaxin for possible TMJ- patient aware not to drive/operate heavy machinery when taking robaxin, she also is aware to DC motrin/ketorolac when taking naproxen. Will place on Clindamycin for possible dental etiology. Patient has dentist appointment in 1 week, encouraged her to follow up sooner if possible, additional dental resources were provided. I discussed treatment plan, need for follow-up, and return precautions with the patient. Provided opportunity for questions, patient confirmed understanding and is in agreement with plan.   Final Clinical Impressions(s) / ED Diagnoses   Final diagnoses:  Jaw pain    ED Discharge Orders        Ordered    naproxen (NAPROSYN) 500 MG tablet  2 times daily     04/11/18 2219    methocarbamol (ROBAXIN) 500 MG tablet  Every 8 hours PRN     04/11/18 2219    clindamycin (CLEOCIN) 150 MG capsule  3 times daily     04/11/18 2219       Cherly Anderson, PA-C 04/11/18 2254    Loren Racer, MD 04/14/18 1527

## 2018-04-11 NOTE — ED Notes (Addendum)
Patient is A&Ox4 at this time.  Patient in no signs of distress.  Please see providers note for complete history and physical exam.  

## 2018-04-11 NOTE — ED Triage Notes (Signed)
Patient has been having jaw pain for the past 3 weeks. c/o of headache, and has been having a difficult time eating. Small amount of swelling present. Patient c/o nausea. Patient has been taking Toradol, ibuprofen 600mg  and tylenol for pain- states her obgyn dr prescribed it to her originally for vaginal pain.

## 2018-04-11 NOTE — ED Notes (Signed)
Patient Alert and oriented to baseline. Stable and ambulatory to baseline. Patient verbalized understanding of the discharge instructions.  Patient belongings were taken by the patient.   

## 2018-04-11 NOTE — Discharge Instructions (Signed)
You were seen in the emergency department today for jaw pain.  We suspect this is related to a dental problem or possibly your temporomandibular joint.  We are placing you on multiple medications in order to try and help treat this. - Clindamycin- this is an antibiotic- take 300 mg three times per day for possible dental infection cause. Please take all of your antibiotics until finished. You may develop abdominal discomfort or diarrhea from the antibiotic.  You may help offset this with probiotics which you can buy at the store (ask your pharmacist if unable to find) or get probiotics in the form of eating yogurt. Do not eat or take the probiotics until 2 hours after your antibiotic. If you are unable to tolerate these side effects follow-up with your primary care provider or return to the emergency department.   If you begin to experience any blistering, rashes, swelling, or difficulty breathing seek medical care for evaluation of potentially more serious side effects.   Please be aware that this medication may interact with other medications you are taking, please be sure to discuss your medication list with your pharmacist. If you are taking birth control the antibiotic will deactivate your birth control for 2 weeks. If on coumadin the antibiotic will effect your coumadin level.   - Naproxen is a nonsteroidal anti-inflammatory medication that will help with pain and swelling. Be sure to take this medication as prescribed with food, 1 pill every 12 hours,  It should be taken with food, as it can cause stomach upset, and more seriously, stomach bleeding. Do not take other nonsteroidal anti-inflammatory medications with this such as Advil, Motrin, ketorolac, mobic, or Aleve.   - Robaxin is the muscle relaxer I have prescribed, this is meant to help with muscle tightness. Be aware that this medication may make you drowsy therefore the first time you take this it should be at a time you are in an environment  where you can rest. Do not drive or operate heavy machinery when taking this medication.   You may take Tylenol per over-the-counter dosing with these medications safely.  We have prescribed you new medication(s) today. Discuss the medications prescribed today with your pharmacist as they can have adverse effects and interactions with your other medicines including over the counter and prescribed medications. Seek medical evaluation if you start to experience new or abnormal symptoms after taking one of these medicines, seek care immediately if you start to experience difficulty breathing, feeling of your throat closing, facial swelling, or rash as these could be indications of a more serious allergic reaction  We would like you to follow-up with a dentist in the next 24 to 48 hours.  Return to the ER anytime for new or worsening symptoms or any other concerns.

## 2018-05-22 ENCOUNTER — Other Ambulatory Visit: Payer: Self-pay | Admitting: Obstetrics and Gynecology

## 2018-07-16 ENCOUNTER — Inpatient Hospital Stay (HOSPITAL_COMMUNITY)
Admission: AD | Admit: 2018-07-16 | Discharge: 2018-07-17 | Disposition: A | Discharge status: Home / Self Care | Payer: BLUE CROSS/BLUE SHIELD | Source: Ambulatory Visit | Attending: Obstetrics & Gynecology | Admitting: Obstetrics & Gynecology

## 2018-07-16 DIAGNOSIS — Z3A01 Less than 8 weeks gestation of pregnancy: Secondary | ICD-10-CM | POA: Insufficient documentation

## 2018-07-16 DIAGNOSIS — O99331 Smoking (tobacco) complicating pregnancy, first trimester: Secondary | ICD-10-CM | POA: Insufficient documentation

## 2018-07-16 DIAGNOSIS — O3680X0 Pregnancy with inconclusive fetal viability, not applicable or unspecified: Secondary | ICD-10-CM

## 2018-07-16 DIAGNOSIS — O26891 Other specified pregnancy related conditions, first trimester: Secondary | ICD-10-CM | POA: Insufficient documentation

## 2018-07-16 DIAGNOSIS — R102 Pelvic and perineal pain: Secondary | ICD-10-CM | POA: Insufficient documentation

## 2018-07-16 DIAGNOSIS — O208 Other hemorrhage in early pregnancy: Secondary | ICD-10-CM | POA: Insufficient documentation

## 2018-07-16 DIAGNOSIS — O26899 Other specified pregnancy related conditions, unspecified trimester: Secondary | ICD-10-CM

## 2018-07-16 NOTE — MAU Note (Addendum)
Positive UPT yesterday.  Has had abdominal cramping for the past week.  No VB. Has irregular periods so unsure of LMP.

## 2018-07-17 ENCOUNTER — Inpatient Hospital Stay (HOSPITAL_COMMUNITY): Payer: BLUE CROSS/BLUE SHIELD

## 2018-07-17 ENCOUNTER — Encounter (HOSPITAL_COMMUNITY): Payer: Self-pay

## 2018-07-17 DIAGNOSIS — O208 Other hemorrhage in early pregnancy: Secondary | ICD-10-CM | POA: Diagnosis not present

## 2018-07-17 DIAGNOSIS — R102 Pelvic and perineal pain: Secondary | ICD-10-CM

## 2018-07-17 DIAGNOSIS — O99331 Smoking (tobacco) complicating pregnancy, first trimester: Secondary | ICD-10-CM | POA: Diagnosis not present

## 2018-07-17 DIAGNOSIS — Z3A01 Less than 8 weeks gestation of pregnancy: Secondary | ICD-10-CM | POA: Diagnosis not present

## 2018-07-17 DIAGNOSIS — R1030 Lower abdominal pain, unspecified: Secondary | ICD-10-CM | POA: Diagnosis present

## 2018-07-17 DIAGNOSIS — O26891 Other specified pregnancy related conditions, first trimester: Secondary | ICD-10-CM | POA: Diagnosis not present

## 2018-07-17 LAB — CBC
HCT: 36.3 % (ref 36.0–46.0)
HEMOGLOBIN: 12.3 g/dL (ref 12.0–15.0)
MCH: 28.7 pg (ref 26.0–34.0)
MCHC: 33.9 g/dL (ref 30.0–36.0)
MCV: 84.6 fL (ref 78.0–100.0)
Platelets: 332 10*3/uL (ref 150–400)
RBC: 4.29 MIL/uL (ref 3.87–5.11)
RDW: 14.2 % (ref 11.5–15.5)
WBC: 9.3 10*3/uL (ref 4.0–10.5)

## 2018-07-17 LAB — URINALYSIS, ROUTINE W REFLEX MICROSCOPIC
BILIRUBIN URINE: NEGATIVE
Glucose, UA: NEGATIVE mg/dL
Ketones, ur: NEGATIVE mg/dL
LEUKOCYTES UA: NEGATIVE
Nitrite: NEGATIVE
PROTEIN: NEGATIVE mg/dL
SPECIFIC GRAVITY, URINE: 1.028 (ref 1.005–1.030)
pH: 5 (ref 5.0–8.0)

## 2018-07-17 LAB — WET PREP, GENITAL
Sperm: NONE SEEN
TRICH WET PREP: NONE SEEN
Yeast Wet Prep HPF POC: NONE SEEN

## 2018-07-17 LAB — POCT PREGNANCY, URINE: Preg Test, Ur: POSITIVE — AB

## 2018-07-17 LAB — HCG, QUANTITATIVE, PREGNANCY: hCG, Beta Chain, Quant, S: 4721 m[IU]/mL — ABNORMAL HIGH (ref <5)

## 2018-07-17 LAB — GC/CHLAMYDIA PROBE AMP (~~LOC~~) NOT AT ARMC
Chlamydia: NEGATIVE
Neisseria Gonorrhea: NEGATIVE

## 2018-07-17 NOTE — Discharge Instructions (Signed)

## 2018-07-17 NOTE — MAU Provider Note (Signed)
Chief Complaint: Possible Pregnancy and Abdominal Pain   First Provider Initiated Contact with Patient 07/17/18 0048        SUBJECTIVE HPI: Peggy Mason is a 22 y.o. G3P2002 at [redacted]w[redacted]d by LMP who presents to maternity admissions reporting lower abdominal cramping for a week.  No bleeidng.   Unsure of LMP   Has not been seen in office yet. She denies vaginal bleeding, vaginal itching/burning, urinary symptoms, h/a, dizziness, n/v, or fever/chills.    Receives care at [facility].  Possible Pregnancy  This is a new problem. The current episode started in the past 7 days. Associated symptoms include abdominal pain. Pertinent negatives include no chills, fever, headaches, nausea or vomiting. Nothing aggravates the symptoms. She has tried nothing for the symptoms.  Abdominal Pain  This is a new problem. The current episode started in the past 7 days. The onset quality is gradual. The problem occurs intermittently. The pain is located in the suprapubic region. The quality of the pain is cramping. The abdominal pain does not radiate. Pertinent negatives include no fever, headaches, nausea or vomiting. Nothing aggravates the pain. The pain is relieved by nothing. She has tried nothing for the symptoms.   RN Note: Positive UPT yesterday.  Has had abdominal cramping for the past week.  No VB. Has irregular periods so unsure of LMP.    Past Medical History:  Diagnosis Date  . Acne   . History of chlamydia infection    Past Surgical History:  Procedure Laterality Date  . HERNIA REPAIR     umbilical   Social History   Socioeconomic History  . Marital status: Single    Spouse name: Not on file  . Number of children: Not on file  . Years of education: Not on file  . Highest education level: Not on file  Occupational History  . Not on file  Social Needs  . Financial resource strain: Not on file  . Food insecurity:    Worry: Not on file    Inability: Not on file  .  Transportation needs:    Medical: Not on file    Non-medical: Not on file  Tobacco Use  . Smoking status: Current Every Day Smoker    Types: Cigars  . Smokeless tobacco: Never Used  . Tobacco comment:  1 to 3 black milds per day - has not smoked the last few days  Substance and Sexual Activity  . Alcohol use: Yes    Comment: occ  . Drug use: Yes    Types: Marijuana  . Sexual activity: Yes    Birth control/protection: None  Lifestyle  . Physical activity:    Days per week: Not on file    Minutes per session: Not on file  . Stress: Not on file  Relationships  . Social connections:    Talks on phone: Not on file    Gets together: Not on file    Attends religious service: Not on file    Active member of club or organization: Not on file    Attends meetings of clubs or organizations: Not on file    Relationship status: Not on file  . Intimate partner violence:    Fear of current or ex partner: Not on file    Emotionally abused: Not on file    Physically abused: Not on file    Forced sexual activity: Not on file  Other Topics Concern  . Not on file  Social History Narrative  .  Not on file   No current facility-administered medications on file prior to encounter.    Current Outpatient Medications on File Prior to Encounter  Medication Sig Dispense Refill  . acetaminophen (TYLENOL) 500 MG tablet Take 1,000 mg by mouth every 6 (six) hours as needed for mild pain.    . clindamycin (CLEOCIN) 150 MG capsule Take 2 capsules (300 mg total) by mouth 3 (three) times daily. 42 capsule 0  . methocarbamol (ROBAXIN) 500 MG tablet Take 1 tablet (500 mg total) by mouth every 8 (eight) hours as needed. 30 tablet 0  . naproxen (NAPROSYN) 500 MG tablet Take 1 tablet (500 mg total) by mouth 2 (two) times daily. 20 tablet 0   Allergies  Allergen Reactions  . Amoxicillin Hives  . Penicillins Hives    Has patient had a PCN reaction causing immediate rash, facial/tongue/throat swelling, SOB or  lightheadedness with hypotension: Yes Has patient had a PCN reaction causing severe rash involving mucus membranes or skin necrosis: No Has patient had a PCN reaction that required hospitalization Yes Has patient had a PCN reaction occurring within the last 10 years: No If all of the above answers are "NO", then may proceed with Cephalosporin use.    I have reviewed patient's Past Medical Hx, Surgical Hx, Family Hx, Social Hx, medications and allergies.   ROS:  Review of Systems  Constitutional: Negative for chills and fever.  Gastrointestinal: Positive for abdominal pain. Negative for nausea and vomiting.  Neurological: Negative for headaches.   Review of Systems  Other systems negative   Physical Exam  Physical Exam Patient Vitals for the past 24 hrs:  BP Temp Pulse Resp SpO2 Height Weight  07/17/18 0000 122/69 99.2 F (37.3 C) 86 19 99 % 5\' 3"  (1.6 m) 81.2 kg   Constitutional: Well-developed, well-nourished female in no acute distress.  Cardiovascular: normal rate Respiratory: normal effort GI: Abd soft, non-tender. Pos BS x 4 MS: Extremities nontender, no edema, normal ROM Neurologic: Alert and oriented x 4.  GU: Neg CVAT.  PELVIC EXAM: Cervix pink, visually closed, without lesion, scant white creamy discharge, vaginal walls and external genitalia normal Bimanual exam: Cervix 0/long/high, firm, anterior, neg CMT, uterus slightly tender, nonenlarged, adnexa without tenderness, enlargement, or mass    LAB RESULTS Results for orders placed or performed during the hospital encounter of 07/16/18 (from the past 24 hour(s))  Urinalysis, Routine w reflex microscopic     Status: Abnormal   Collection Time: 07/17/18 12:07 AM  Result Value Ref Range   Color, Urine YELLOW YELLOW   APPearance HAZY (A) CLEAR   Specific Gravity, Urine 1.028 1.005 - 1.030   pH 5.0 5.0 - 8.0   Glucose, UA NEGATIVE NEGATIVE mg/dL   Hgb urine dipstick MODERATE (A) NEGATIVE   Bilirubin Urine  NEGATIVE NEGATIVE   Ketones, ur NEGATIVE NEGATIVE mg/dL   Protein, ur NEGATIVE NEGATIVE mg/dL   Nitrite NEGATIVE NEGATIVE   Leukocytes, UA NEGATIVE NEGATIVE   RBC / HPF 6-10 0 - 5 RBC/hpf   WBC, UA 0-5 0 - 5 WBC/hpf   Bacteria, UA RARE (A) NONE SEEN   Squamous Epithelial / LPF 0-5 0 - 5   Mucus PRESENT   Pregnancy, urine POC     Status: Abnormal   Collection Time: 07/17/18 12:11 AM  Result Value Ref Range   Preg Test, Ur POSITIVE (A) NEGATIVE  CBC     Status: None   Collection Time: 07/17/18  1:13 AM  Result Value Ref Range  WBC 9.3 4.0 - 10.5 K/uL   RBC 4.29 3.87 - 5.11 MIL/uL   Hemoglobin 12.3 12.0 - 15.0 g/dL   HCT 13.0 86.5 - 78.4 %   MCV 84.6 78.0 - 100.0 fL   MCH 28.7 26.0 - 34.0 pg   MCHC 33.9 30.0 - 36.0 g/dL   RDW 69.6 29.5 - 28.4 %   Platelets 332 150 - 400 K/uL  hCG, quantitative, pregnancy     Status: Abnormal   Collection Time: 07/17/18  1:13 AM  Result Value Ref Range   hCG, Beta Chain, Quant, S 4,721 (H) <5 mIU/mL  Wet prep, genital     Status: Abnormal   Collection Time: 07/17/18  1:35 AM  Result Value Ref Range   Yeast Wet Prep HPF POC NONE SEEN NONE SEEN   Trich, Wet Prep NONE SEEN NONE SEEN   Clue Cells Wet Prep HPF POC PRESENT (A) NONE SEEN   WBC, Wet Prep HPF POC FEW (A) NONE SEEN   Sperm NONE SEEN     IMAGING US Ob Comp Less 14 Wks  Result Date: 07/17/2018 CLINICAL DATA:  Pregnant, pain x1 week EXAM: OBSTETRIC <14 WK [place] AND TRANSVAGINAL OB US TECHNIQUE: Both transabdominal and transvaginal ultrasound examinations were performed for complete evaluation of the gestation as well as the maternal uterus, adnexal regions, and pelvic cul-de-sac. Transvaginal technique was performed to assess early pregnancy. COMPARISON:  None. FINDINGS: Intrauterine gestational sac: Single Yolk sac:  Visualized. Embryo:  Not Visualized. MSD: 5.6 mm   5 w   2  d Subchorionic hemorrhage:  Large subchronic hemorrhage. Maternal uterus/adnexae: Bilateral ovaries are within normal  limits, noting a left corpus luteal cyst. No free fluid. IMPRESSION: Single intrauterine gestational sac with yolk sac, measuring 5 weeks 2 days by mean sac diameter. No fetal pole is visualized. Consider follow-up pelvic ultrasound in 14 days to confirm viability, as clinically warranted. Electronically Signed   By: Charline Bills M.D.   On: 07/17/2018 02:11   US Ob Transvaginal  Result Date: 07/17/2018 CLINICAL DATA:  Pregnant, pain x1 week EXAM: OBSTETRIC <14 WK [place] AND TRANSVAGINAL OB US TECHNIQUE: Both transabdominal and transvaginal ultrasound examinations were performed for complete evaluation of the gestation as well as the maternal uterus, adnexal regions, and pelvic cul-de-sac. Transvaginal technique was performed to assess early pregnancy. COMPARISON:  None. FINDINGS: Intrauterine gestational sac: Single Yolk sac:  Visualized. Embryo:  Not Visualized. MSD: 5.6 mm   5 w   2  d Subchorionic hemorrhage:  Large subchronic hemorrhage. Maternal uterus/adnexae: Bilateral ovaries are within normal limits, noting a left corpus luteal cyst. No free fluid. IMPRESSION: Single intrauterine gestational sac with yolk sac, measuring 5 weeks 2 days by mean sac diameter. No fetal pole is visualized. Consider follow-up pelvic ultrasound in 14 days to confirm viability, as clinically warranted. Electronically Signed   By: Charline Bills M.D.   On: 07/17/2018 02:11     MAU Management/MDM: Ordered usual first trimester r/o ectopic labs.   Pelvic exam and cultures done Will check baseline Ultrasound to rule out ectopic.  This bleeding/pain can represent a normal pregnancy with bleeding, spontaneous abortion or even an ectopic which can be life-threatening.  The process as listed above helps to determine which of these is present.  Discussed GS with yolk sac essentially rules out ectopic.  Recommend followup in a week for viability Discussed large Subchorionic hemorrhage may be at risk for  miscarriage   ASSESSMENT Single intrauterine pregnancy at [redacted]w[redacted]d Large  subchorionic hemorrhage Pelvic pain in pregnancy Pregnancy of unkown location, now IUP  PLAN Discharge home Pelvic rest Bleeding precautions Recommend call office in am to schedule followup next week  Pt stable at time of discharge. Encouraged to return here or to other Urgent Care/ED if she develops worsening of symptoms, increase in pain, fever, or other concerning symptoms.    Wynelle Bourgeois CNM, MSN Certified Nurse-Midwife 07/17/2018  12:48 AM

## 2018-07-18 LAB — HIV ANTIBODY (ROUTINE TESTING W REFLEX): HIV Screen 4th Generation wRfx: NONREACTIVE

## 2018-07-24 ENCOUNTER — Inpatient Hospital Stay (HOSPITAL_COMMUNITY)
Admission: AD | Admit: 2018-07-24 | Discharge: 2018-07-24 | Disposition: A | Discharge status: Home / Self Care | Payer: BLUE CROSS/BLUE SHIELD | Source: Ambulatory Visit | Attending: Obstetrics & Gynecology | Admitting: Obstetrics & Gynecology

## 2018-07-24 ENCOUNTER — Encounter (HOSPITAL_COMMUNITY): Payer: Self-pay

## 2018-07-24 DIAGNOSIS — Z3A01 Less than 8 weeks gestation of pregnancy: Secondary | ICD-10-CM | POA: Insufficient documentation

## 2018-07-24 DIAGNOSIS — Z880 Allergy status to penicillin: Secondary | ICD-10-CM | POA: Diagnosis not present

## 2018-07-24 DIAGNOSIS — O219 Vomiting of pregnancy, unspecified: Secondary | ICD-10-CM

## 2018-07-24 DIAGNOSIS — R102 Pelvic and perineal pain: Secondary | ICD-10-CM | POA: Diagnosis present

## 2018-07-24 DIAGNOSIS — O210 Mild hyperemesis gravidarum: Secondary | ICD-10-CM | POA: Insufficient documentation

## 2018-07-24 HISTORY — DX: Anemia, unspecified: D64.9

## 2018-07-24 LAB — URINALYSIS, ROUTINE W REFLEX MICROSCOPIC
Bilirubin Urine: NEGATIVE
Glucose, UA: NEGATIVE mg/dL
HGB URINE DIPSTICK: NEGATIVE
Ketones, ur: NEGATIVE mg/dL
Leukocytes, UA: NEGATIVE
Nitrite: NEGATIVE
PROTEIN: NEGATIVE mg/dL
Specific Gravity, Urine: 1.014 (ref 1.005–1.030)
pH: 8 (ref 5.0–8.0)

## 2018-07-24 MED ORDER — POLYETHYLENE GLYCOL 3350 17 G PO PACK
17.0000 g | PACK | Freq: Once | ORAL | Status: DC
  Filled 2018-07-24: qty 1

## 2018-07-24 MED ORDER — DOXYLAMINE-PYRIDOXINE 10-10 MG PO TBEC: DELAYED_RELEASE_TABLET | ORAL | 0 refills | Status: DC

## 2018-07-24 MED ORDER — LACTATED RINGERS IV BOLUS
1000.0000 mL | Freq: Once | INTRAVENOUS | Status: AC
  Administered 2018-07-24: 1000 mL via INTRAVENOUS

## 2018-07-24 MED ORDER — PROMETHAZINE HCL 25 MG PO TABS
25.0000 mg | ORAL_TABLET | Freq: Once | ORAL | Status: AC
  Administered 2018-07-24: 25 mg via ORAL
  Filled 2018-07-24: qty 1

## 2018-07-24 MED ORDER — POLYETHYLENE GLYCOL 3350 17 GM/SCOOP PO POWD: 17.0000 g | Freq: Every day | ORAL | 0 refills | Status: DC

## 2018-07-24 NOTE — Discharge Instructions (Signed)
Nausea & Vomiting  Have saltine crackers or pretzels by your bed and eat a few bites before you raise your head out of bed in the morning  Eat small frequent meals throughout the day instead of large meals  Drink plenty of fluids throughout the day to stay hydrated, just don't drink a lot of fluids with your meals.  This can make your stomach fill up faster making you feel sick  Do not brush your teeth right after you eat  Products with real ginger are good for nausea, like ginger ale and ginger hard candy Make sure it says made with real ginger!  Sucking on sour candy like lemon heads is also good for nausea  If your prenatal vitamins make you nauseated, take them at night so you will sleep through the nausea  Sea Bands  If you are unable to keep any fluids or food down please let us know

## 2018-07-24 NOTE — MAU Note (Signed)
Pt was able to keep crackers down and is feeling less nauseous and no recent vomiting

## 2018-07-24 NOTE — MAU Note (Signed)
Pt has been feeling nauseous since 11pm last night. No vomiting. Some pelvic pain 5/10. No bleeding.

## 2018-07-24 NOTE — MAU Provider Note (Addendum)
History     CSN: [id number]  Arrival date and time: 07/24/18 7829   First Provider Initiated Contact with Patient 07/24/18 [phone]      Chief Complaint  Patient presents with  . Nausea  . Pelvic Pain   Treena Cosman is 22 y.o G3P2002 at [redacted]w[redacted]d presenting to the MAU today for nausea that started last night around 11pm. She has not had any vomiting but endoreses fatigue, states she has chills all the time. She also endorses mild constipation, and LLQ pain. Last BM was yesterday morning and felt like she "needed to go" yesterday evening but was unable to. She denies sick contacts, denies H/A. She has a hx of left ovarian cyst, and has upcoming prenatal clinic visit on Nov 12th.     Past Medical History:  Diagnosis Date  . Acne   . Anemia   . History of chlamydia infection     Past Surgical History:  Procedure Laterality Date  . HERNIA REPAIR     umbilical  . WISDOM TOOTH EXTRACTION      Family History  Problem Relation Age of Onset  . Diabetes Paternal Grandmother   . Asthma Sister   . Learning disabilities Sister   . Kidney disease Sister        dialysis    Social History   Tobacco Use  . Smoking status: Former Smoker    Types: Cigars  . Smokeless tobacco: Never Used  . Tobacco comment: not smoked since pregnant, passive smoker  Substance Use Topics  . Alcohol use: Not Currently  . Drug use: Yes    Types: Marijuana    Comment: not since pregnant    Allergies:  Allergies  Allergen Reactions  . Amoxicillin Hives  . Penicillins Hives    Has patient had a PCN reaction causing immediate rash, facial/tongue/throat swelling, SOB or lightheadedness with hypotension: Yes Has patient had a PCN reaction causing severe rash involving mucus membranes or skin necrosis: No Has patient had a PCN reaction that required hospitalization Yes Has patient had a PCN reaction occurring within the last 10 years: No If all of the above answers are "NO", then may proceed with  Cephalosporin use.    Medications Prior to Admission  Medication Sig Dispense Refill Last Dose  . acetaminophen (TYLENOL) 500 MG tablet Take 1,000 mg by mouth every 6 (six) hours as needed for mild pain.   04/11/2018 at Unknown time    Review of Systems  Constitutional: Positive for chills and fatigue. Negative for fever.  HENT: Negative for sinus pressure, sinus pain and sore throat.   Respiratory: Negative for cough, shortness of breath and wheezing.   Gastrointestinal: Positive for abdominal pain, constipation and nausea. Negative for vomiting.  Genitourinary: Positive for frequency and pelvic pain. Negative for dysuria.   Physical Exam   Blood pressure 115/64, pulse 87, temperature 98.7 F (37.1 C), temperature source Oral, resp. rate 16, weight 82.1 kg, last menstrual period 06/15/2018, SpO2 100 %, unknown if currently breastfeeding.  Physical Exam  Constitutional: She is oriented to person, place, and time. She appears well-developed and well-nourished.  HENT:  Head: Normocephalic and atraumatic.  Eyes: Conjunctivae and EOM are normal.  Neck: Normal range of motion. Neck supple.  Cardiovascular: Normal rate, regular rhythm and intact distal pulses.  Respiratory: Effort normal and breath sounds normal.  GI: Soft. Bowel sounds are normal. There is tenderness.  Musculoskeletal: Normal range of motion.  Neurological: She is alert and oriented to person, place, and  time.  Skin: Skin is warm and dry.  Psychiatric: She has a normal mood and affect. Her behavior is normal.    MAU Course  Procedures  MDM - Prior U/S shows viable IUP, not ectopic.  Vitals stable, not tachycardic, HDS. Labs ordered and Reviewed - U/A is clear, no ketones, with nl sp grav - phenergan 25mg  given  - miralax 17g given - given bolus x2 after vomiting and sx of dizziness  Assessment and Plan  Nausea in 1st Trimester Pregnancy - continue anti-emetics prn - f/u with Ob-gyn - stable at  discharge  Nausea & Vomiting  Have saltine crackers or pretzels by your bed and eat a few bites before you raise your head out of bed in the morning  Eat small frequent meals throughout the day instead of large meals  Drink plenty of fluids throughout the day to stay hydrated, just don't drink a lot of fluids with your meals.  This can make your stomach fill up faster making you feel sick  Do not brush your teeth right after you eat  Products with real ginger are good for nausea, like ginger ale and ginger hard candy Make sure it says made with real ginger!  Sucking on sour candy like lemon heads is also good for nausea  If your prenatal vitamins make you nauseated, take them at night so you will sleep through the nausea  Sea Bands  If you feel like you need medicine for the nausea & vomiting please let us know  If you are unable to keep any fluids or food down please let us know   Folafunmi Omofoye 07/24/2018, 10:30 AM   OB FELLOW MAU DISCHARGE ATTESTATION  I have seen and examined this patient; I agree with above documentation in the medical student's note.   GRISELL BISSETTE is 22 y.o. G3P2002 at [redacted]w[redacted]d who presents for nausea. One episode of vomiting in MAU. Reports not drinking any fluids today. Feels constipated. Received Phenergan 25 mg PO prior to emesis. Given 2L bolus LR due to limited PO intake and report of lightheadedness. Miralax given. VSS. Weight is up since MAU visit on 10/4. UA without signs of dehydration. Abdominal exam with normal BS, soft, no rebound or guarding. Mild diffuse TTP, no focal tenderness. Recent pelvic sono shows intrauterine yolk sac and gestational sac without evidence of ectopic pregnancy. On recheck, patient is eating crackers and tolerating PO fluid intake. No further episodes of vomiting. Patient discharged in stable condition with Rx for Doxylamine-Pyridoxine and Miralax. Discussed supportive care measures for preventing early pregnancy N/V.  Strict return precautions discussed. Follow up with OB.   Marcy Siren, D.O. OB Fellow  07/24/2018, 1:38 PM

## 2018-08-07 ENCOUNTER — Other Ambulatory Visit: Payer: Self-pay

## 2018-08-07 ENCOUNTER — Encounter (HOSPITAL_COMMUNITY): Payer: Self-pay | Admitting: *Deleted

## 2018-08-07 ENCOUNTER — Inpatient Hospital Stay (HOSPITAL_COMMUNITY)
Admission: AD | Admit: 2018-08-07 | Discharge: 2018-08-07 | Disposition: A | Discharge status: Home / Self Care | Payer: BLUE CROSS/BLUE SHIELD | Source: Ambulatory Visit | Attending: Obstetrics & Gynecology | Admitting: Obstetrics & Gynecology

## 2018-08-07 DIAGNOSIS — E860 Dehydration: Secondary | ICD-10-CM

## 2018-08-07 DIAGNOSIS — Z880 Allergy status to penicillin: Secondary | ICD-10-CM | POA: Insufficient documentation

## 2018-08-07 DIAGNOSIS — O210 Mild hyperemesis gravidarum: Secondary | ICD-10-CM | POA: Diagnosis not present

## 2018-08-07 DIAGNOSIS — Z3A08 8 weeks gestation of pregnancy: Secondary | ICD-10-CM | POA: Diagnosis not present

## 2018-08-07 DIAGNOSIS — O26891 Other specified pregnancy related conditions, first trimester: Secondary | ICD-10-CM | POA: Diagnosis not present

## 2018-08-07 DIAGNOSIS — O219 Vomiting of pregnancy, unspecified: Secondary | ICD-10-CM | POA: Diagnosis not present

## 2018-08-07 DIAGNOSIS — R102 Pelvic and perineal pain: Secondary | ICD-10-CM | POA: Insufficient documentation

## 2018-08-07 DIAGNOSIS — Z87891 Personal history of nicotine dependence: Secondary | ICD-10-CM | POA: Diagnosis not present

## 2018-08-07 LAB — COMPREHENSIVE METABOLIC PANEL
ALBUMIN: 3.3 g/dL — AB (ref 3.5–5.0)
ALT: 20 U/L (ref 0–44)
AST: 16 U/L (ref 15–41)
Alkaline Phosphatase: 58 U/L (ref 38–126)
Anion gap: 8 (ref 5–15)
BUN: 8 mg/dL (ref 6–20)
CO2: 21 mmol/L — AB (ref 22–32)
Calcium: 9.1 mg/dL (ref 8.9–10.3)
Chloride: 106 mmol/L (ref 98–111)
Creatinine, Ser: 0.47 mg/dL (ref 0.44–1.00)
GFR calc Af Amer: >60 mL/min (ref >60)
GFR calc non Af Amer: >60 mL/min (ref >60)
GLUCOSE: 74 mg/dL (ref 70–99)
POTASSIUM: 3.8 mmol/L (ref 3.5–5.1)
SODIUM: 135 mmol/L (ref 135–145)
TOTAL PROTEIN: 6.3 g/dL — AB (ref 6.5–8.1)
Total Bilirubin: 0.5 mg/dL (ref 0.3–1.2)

## 2018-08-07 LAB — CBC
HCT: 35.1 % — ABNORMAL LOW (ref 36.0–46.0)
Hemoglobin: 12 g/dL (ref 12.0–15.0)
MCH: 28.4 pg (ref 26.0–34.0)
MCHC: 34.2 g/dL (ref 30.0–36.0)
MCV: 83.2 fL (ref 80.0–100.0)
Platelets: 337 10*3/uL (ref 150–400)
RBC: 4.22 MIL/uL (ref 3.87–5.11)
RDW: 13.1 % (ref 11.5–15.5)
WBC: 7.8 10*3/uL (ref 4.0–10.5)
nRBC: 0 % (ref 0.0–0.2)

## 2018-08-07 LAB — URINALYSIS, ROUTINE W REFLEX MICROSCOPIC
BILIRUBIN URINE: NEGATIVE
GLUCOSE, UA: NEGATIVE mg/dL
KETONES UR: 80 mg/dL — AB
Leukocytes, UA: NEGATIVE
Nitrite: NEGATIVE
Protein, ur: NEGATIVE mg/dL
Specific Gravity, Urine: 1.023 (ref 1.005–1.030)
pH: 6 (ref 5.0–8.0)

## 2018-08-07 MED ORDER — LACTATED RINGERS IV BOLUS
1000.0000 mL | Freq: Once | INTRAVENOUS | Status: AC
  Administered 2018-08-07: 1000 mL via INTRAVENOUS

## 2018-08-07 MED ORDER — METOCLOPRAMIDE HCL 5 MG/ML IJ SOLN
10.0000 mg | Freq: Once | INTRAMUSCULAR | Status: AC
  Administered 2018-08-07: 10 mg via INTRAVENOUS
  Filled 2018-08-07: qty 2

## 2018-08-07 MED ORDER — M.V.I. ADULT IV INJ
Freq: Once | INTRAVENOUS | Status: AC
  Administered 2018-08-07: 18:00:00 via INTRAVENOUS
  Filled 2018-08-07: qty 10

## 2018-08-07 MED ORDER — FAMOTIDINE IN NACL 20-0.9 MG/50ML-% IV SOLN
20.0000 mg | Freq: Once | INTRAVENOUS | Status: AC
  Administered 2018-08-07: 20 mg via INTRAVENOUS
  Filled 2018-08-07: qty 50

## 2018-08-07 MED ORDER — METOCLOPRAMIDE HCL 10 MG PO TABS: 10.0000 mg | ORAL_TABLET | Freq: Four times a day (QID) | ORAL | 0 refills | Status: DC | PRN

## 2018-08-07 MED ORDER — PROMETHAZINE HCL 25 MG RE SUPP: 25.0000 mg | Freq: Four times a day (QID) | RECTAL | 0 refills | Status: DC | PRN

## 2018-08-07 MED ORDER — SCOPOLAMINE 1 MG/3DAYS TD PT72: 1.0000 | MEDICATED_PATCH | TRANSDERMAL | 2 refills | Status: DC

## 2018-08-07 MED ORDER — SCOPOLAMINE 1 MG/3DAYS TD PT72
1.0000 | MEDICATED_PATCH | TRANSDERMAL | Status: DC
  Administered 2018-08-07: 1.5 mg via TRANSDERMAL
  Filled 2018-08-07: qty 1

## 2018-08-07 NOTE — MAU Note (Signed)
Not able to keep any type of food or fluid down.  Been like this since the 15th.  The medication they gave her just made her groggy and didn't help. Is hungry.  Throwing up dk green bile. Some abd pain, occ sharp pains, feels 'heavy'.

## 2018-08-07 NOTE — Discharge Instructions (Signed)
Eating Plan for Hyperemesis Gravidarum °Hyperemesis gravidarum is a severe form of morning sickness. Because this condition causes severe nausea and vomiting, it can lead to dehydration, malnutrition, and weight loss. One way to lessen the symptoms of nausea and vomiting is to follow the eating plan for hyperemesis gravidarum. It is often used along with prescribed medicines to control your symptoms. °What can I do to relieve my symptoms? °Listen to your body. Everyone is different and has different preferences. Find what works best for you. Take any of the following actions that are helpful to you: °· Eat and drink slowly. °· Eat 5-6 small meals daily instead of 3 large meals. °· Eat crackers before you get out of bed in the morning. °· Try having a snack in the middle of the night. °· Starchy foods are usually tolerated well. Examples include cereal, toast, bread, potatoes, pasta, rice, and pretzels. °· Ginger may help with nausea. Add ¼ tsp ground ginger to hot tea or choose ginger tea. °· Try drinking 100% fruit juice or an electrolyte drink. An electrolyte drink contains sodium, potassium, and chloride. °· Continue to take your prenatal vitamins as told by your health care provider. If you are having trouble taking your prenatal vitamins, talk with your health care provider about different options. °· Include at least 1 serving of protein with your meals and snacks. Protein options include meats or poultry, beans, nuts, eggs, and yogurt. Try eating a protein-rich snack before bed. Examples of these snacks include cheese and crackers or half of a peanut butter or turkey sandwich. °· Consider eliminating foods that trigger your symptoms. These may include spicy foods, coffee, high-fat foods, very sweet foods, and acidic foods. °· Try meals that have more protein combined with bland, salty, lower-fat, and dry foods, such as nuts, seeds, pretzels, crackers, and cereal. °· Talk with your healthcare provider about  starting a supplement of vitamin B6. °· Have fluids that are cold, clear, and carbonated or sour. Examples include lemonade, ginger ale, lemon-lime soda, ice water, and sparkling water. °· Try lemon or mint tea. °· Try brushing your teeth or using a mouth rinse after meals. ° °What should I avoid to reduce my symptoms? °Avoiding some of the following things may help reduce your symptoms. °· Foods with strong smells. Try eating meals in well-ventilated areas that are free of odors. °· Drinking water or other beverages with meals. Try not to drink anything during the 30 minutes before and after your meals. °· Drinking more than 1 cup of fluid at a time. Sometimes using a straw helps. °· Fried or high-fat foods, such as butter and cream sauces. °· Spicy foods. °· Skipping meals as best as you can. Nausea can be more intense on an empty stomach. If you cannot tolerate food at that time, do not force it. Try sucking on ice chips or other frozen items, and make up for missed calories later. °· Lying down within 2 hours after eating. °· Environmental triggers. These may include smoky rooms, closed spaces, rooms with strong smells, warm or humid places, overly loud and noisy rooms, and rooms with motion or flickering lights. °· Quick and sudden changes in your movement. ° °This information is not intended to replace advice given to you by your health care provider. Make sure you discuss any questions you have with your health care provider. °Document Released: 07/28/2007 Document Revised: 05/29/2016 Document Reviewed: 04/30/2016 °Elsevier Interactive Patient Education © 2018 [employer] °Morning Sickness °Morning sickness   is when you feel sick to your stomach (nauseous) during pregnancy. You may feel sick to your stomach and throw up (vomit). You may feel sick in the morning, but you can feel this way any time of day. Some women feel very sick to their stomach and cannot stop throwing up (hyperemesis gravidarum). °Follow  these instructions at home: °· Only take medicines as told by your doctor. °· Take multivitamins as told by your doctor. Taking multivitamins before getting pregnant can stop or lessen the harshness of morning sickness. °· Eat dry toast or unsalted crackers before getting out of bed. °· Eat 5 to 6 small meals a day. °· Eat dry and bland foods like rice and baked potatoes. °· Do not drink liquids with meals. Drink between meals. °· Do not eat greasy, fatty, or spicy foods. °· Have someone cook for you if the smell of food causes you to feel sick or throw up. °· If you feel sick to your stomach after taking prenatal vitamins, take them at night or with a snack. °· Eat protein when you need a snack (nuts, yogurt, cheese). °· Eat unsweetened gelatins for dessert. °· Wear a bracelet used for sea sickness (acupressure wristband). °· Go to a doctor that puts thin needles into certain body points (acupuncture) to improve how you feel. °· Do not smoke. °· Use a humidifier to keep the air in your house free of odors. °· Get lots of fresh air. °Contact a doctor if: °· You need medicine to feel better. °· You feel dizzy or lightheaded. °· You are losing weight. °Get help right away if: °· You feel very sick to your stomach and cannot stop throwing up. °· You pass out (faint). °This information is not intended to replace advice given to you by your health care provider. Make sure you discuss any questions you have with your health care provider. °Document Released: 11/07/2004 Document Revised: 03/07/2016 Document Reviewed: 03/17/2013 °Elsevier Interactive Patient Education © 2017 [employer] ° °

## 2018-08-07 NOTE — MAU Provider Note (Addendum)
History     CSN: [id number]  Arrival date and time: 08/07/18 1436   First Provider Initiated Contact with Patient 08/07/18 1510      Chief Complaint  Patient presents with  . Emesis  . Abdominal Pain   G3P2002 @[redacted]w[redacted]d  here with N/V. Sx have been ongoing for the last 2-3 weeks. She cannot tolerate anything po. She has tried [place] but it only made her sleepy and didn't help her nausea. Reports 7 lb weight loss. Also endorses intermittent pelvic pain. No urinary sx. No VB or discharge.   OB History    Gravida  3   Para  2   Term  2   Preterm  0   AB  0   Living  2     SAB  0   TAB  0   Ectopic  0   Multiple  0   Live Births  2           Past Medical History:  Diagnosis Date  . Acne   . Anemia   . History of chlamydia infection     Past Surgical History:  Procedure Laterality Date  . HERNIA REPAIR     umbilical  . WISDOM TOOTH EXTRACTION      Family History  Problem Relation Age of Onset  . Diabetes Paternal Grandmother   . Asthma Sister   . Learning disabilities Sister   . Kidney disease Sister        dialysis    Social History   Tobacco Use  . Smoking status: Former Smoker    Types: Cigars  . Smokeless tobacco: Never Used  . Tobacco comment: not smoked since pregnant, passive smoker  Substance Use Topics  . Alcohol use: Not Currently  . Drug use: Yes    Types: Marijuana    Comment: not since pregnant    Allergies:  Allergies  Allergen Reactions  . Amoxicillin Hives  . Penicillins Hives    Has patient had a PCN reaction causing immediate rash, facial/tongue/throat swelling, SOB or lightheadedness with hypotension: Yes Has patient had a PCN reaction causing severe rash involving mucus membranes or skin necrosis: No Has patient had a PCN reaction that required hospitalization Yes Has patient had a PCN reaction occurring within the last 10 years: No If all of the above answers are "NO", then may proceed with Cephalosporin use.     Medications Prior to Admission  Medication Sig Dispense Refill Last Dose  . Doxylamine-Pyridoxine 10-10 MG TBEC Take 4 tablets by mouth daily prn. 1 tab in AM, 1 tab mid afternoon 2 tabs at bedtime. Max dose 4 tabs daily.  Dispense: 60 tablet 0   . polyethylene glycol powder (GLYCOLAX/MIRALAX) powder Take 17 g by mouth daily. 500 g 0     Review of Systems  Constitutional: Negative for fever.  Gastrointestinal: Positive for nausea and vomiting. Negative for constipation and diarrhea.  Genitourinary: Positive for pelvic pain. Negative for dysuria, vaginal bleeding and vaginal discharge.   Physical Exam   Blood pressure 109/70, pulse 66, temperature 98.5 F (36.9 C), temperature source Oral, resp. rate 16, weight 78.2 kg, last menstrual period 06/15/2018, SpO2 100 %, unknown if currently breastfeeding.  Physical Exam  Constitutional: She is oriented to person, place, and time. She appears well-developed and well-nourished. No distress.  HENT:  Head: Normocephalic and atraumatic.  Neck: Normal range of motion.  Cardiovascular: Normal rate.  Respiratory: Effort normal. No respiratory distress.  GI: Soft. She exhibits no  distension and no mass. There is tenderness in the suprapubic area. There is no rebound and no guarding.  Musculoskeletal: Normal range of motion.  Neurological: She is alert and oriented to person, place, and time.  Skin: Skin is warm and dry.  Psychiatric: She has a normal mood and affect.   Limited bedside US: viable fetus, +cardiac activity, subj. nml AFV  Results for orders placed or performed during the hospital encounter of 08/07/18 (from the past 24 hour(s))  Urinalysis, Routine w reflex microscopic     Status: Abnormal   Collection Time: 08/07/18  3:21 PM  Result Value Ref Range   Color, Urine YELLOW YELLOW   APPearance CLOUDY (A) CLEAR   Specific Gravity, Urine 1.023 1.005 - 1.030   pH 6.0 5.0 - 8.0   Glucose, UA NEGATIVE NEGATIVE mg/dL   Hgb urine  dipstick MODERATE (A) NEGATIVE   Bilirubin Urine NEGATIVE NEGATIVE   Ketones, ur 80 (A) NEGATIVE mg/dL   Protein, ur NEGATIVE NEGATIVE mg/dL   Nitrite NEGATIVE NEGATIVE   Leukocytes, UA NEGATIVE NEGATIVE   RBC / HPF 21-50 0 - 5 RBC/hpf   WBC, UA 0-5 0 - 5 WBC/hpf   Bacteria, UA RARE (A) NONE SEEN   Squamous Epithelial / LPF 0-5 0 - 5   Mucus PRESENT    Amorphous Crystal PRESENT   CBC     Status: Abnormal   Collection Time: 08/07/18  5:28 PM  Result Value Ref Range   WBC 7.8 4.0 - 10.5 K/uL   RBC 4.22 3.87 - 5.11 MIL/uL   Hemoglobin 12.0 12.0 - 15.0 g/dL   HCT 16.1 (L) 09.6 - 04.5 %   MCV 83.2 80.0 - 100.0 fL   MCH 28.4 26.0 - 34.0 pg   MCHC 34.2 30.0 - 36.0 g/dL   RDW 40.9 81.1 - 91.4 %   Platelets 337 150 - 400 K/uL   nRBC 0.0 0.0 - 0.2 %  Comprehensive metabolic panel     Status: Abnormal   Collection Time: 08/07/18  5:28 PM  Result Value Ref Range   Sodium 135 135 - 145 mmol/L   Potassium 3.8 3.5 - 5.1 mmol/L   Chloride 106 98 - 111 mmol/L   CO2 21 (L) 22 - 32 mmol/L   Glucose, Bld 74 70 - 99 mg/dL   BUN 8 6 - 20 mg/dL   Creatinine, Ser 7.82 0.44 - 1.00 mg/dL   Calcium 9.1 8.9 - 95.6 mg/dL   Total Protein 6.3 (L) 6.5 - 8.1 g/dL   Albumin 3.3 (L) 3.5 - 5.0 g/dL   AST 16 15 - 41 U/L   ALT 20 0 - 44 U/L   Alkaline Phosphatase 58 38 - 126 U/L   Total Bilirubin 0.5 0.3 - 1.2 mg/dL   GFR calc non Af Amer >60 >60 mL/min   GFR calc Af Amer >60 >60 mL/min   Anion gap 8 5 - 15   MAU Course  Procedures Scop patch Reglan Pepcid LR MTV IV  MDM Labs ordered and reviewed. No further emesis. Tolerating po fluids after meds. No UTI, recent GC negative. Stable for discharge home.  Assessment and Plan   1. [redacted] weeks gestation of pregnancy   2. Morning sickness   3. Dehydration   4. Pelvic pain affecting pregnancy in first trimester, antepartum    Discharge home Follow up at [facility] a scheduled Rx Scopolamine Rx Reglan Rx Phenergan supp HEG  diet  Allergies as of 08/07/2018  Reactions   Amoxicillin Hives   Penicillins Hives   Has patient had a PCN reaction causing immediate rash, facial/tongue/throat swelling, SOB or lightheadedness with hypotension: Yes Has patient had a PCN reaction causing severe rash involving mucus membranes or skin necrosis: No Has patient had a PCN reaction that required hospitalization Yes Has patient had a PCN reaction occurring within the last 10 years: No If all of the above answers are "NO", then may proceed with Cephalosporin use.      Medication List    STOP taking these medications   Doxylamine-Pyridoxine 10-10 MG Tbec     TAKE these medications   metoCLOPramide 10 MG tablet Commonly known as:  REGLAN Take 1 tablet (10 mg total) by mouth every 6 (six) hours as needed for nausea.   polyethylene glycol powder powder Commonly known as:  GLYCOLAX/MIRALAX Take 17 g by mouth daily.   promethazine 25 MG suppository Commonly known as:  PHENERGAN Place 1 suppository (25 mg total) rectally every 6 (six) hours as needed for nausea.   scopolamine 1 MG/3DAYS Commonly known as:  TRANSDERM-SCOP Place 1 patch (1.5 mg total) onto the skin every 3 (three) days. Start taking on:  08/10/2018      Donette Larry, CNM 08/07/2018, 3:30 PM

## 2018-08-20 DIAGNOSIS — A6000 Herpesviral infection of urogenital system, unspecified: Secondary | ICD-10-CM | POA: Insufficient documentation

## 2018-08-25 LAB — OB RESULTS CONSOLE RUBELLA ANTIBODY, IGM: Rubella: NON-IMMUNE/NOT IMMUNE

## 2018-08-25 LAB — OB RESULTS CONSOLE GC/CHLAMYDIA
Chlamydia: NEGATIVE
Gonorrhea: NEGATIVE

## 2018-08-25 LAB — OB RESULTS CONSOLE HEPATITIS B SURFACE ANTIGEN: Hepatitis B Surface Ag: NEGATIVE

## 2018-08-25 LAB — OB RESULTS CONSOLE RPR: RPR: NONREACTIVE

## 2018-10-14 NOTE — L&D Delivery Note (Signed)
Delivery Note At  a viable female was delivered via  (Presentation:OA ;  ).  APGAR:6 ,8 ; weight pending  .   Placenta status: complete, . 3V Cord:  with the following complications:nuchal cord x 1 loose .  Cord pH:   Anesthesia:  Epidural Episiotomy:  None Lacerations:  None Suture Repair: N/A Est. Blood Loss (mL):651    Mom to postpartum.  Baby to Couplet care / Skin to Skin.  Peggy Mason 03/12/2019, 1:20 AM

## 2018-11-15 ENCOUNTER — Encounter (HOSPITAL_COMMUNITY): Payer: Self-pay | Admitting: *Deleted

## 2018-11-15 ENCOUNTER — Inpatient Hospital Stay (HOSPITAL_COMMUNITY): Payer: BLUE CROSS/BLUE SHIELD

## 2018-11-15 ENCOUNTER — Inpatient Hospital Stay (HOSPITAL_BASED_OUTPATIENT_CLINIC_OR_DEPARTMENT_OTHER): Payer: BLUE CROSS/BLUE SHIELD

## 2018-11-15 ENCOUNTER — Inpatient Hospital Stay (HOSPITAL_COMMUNITY)
Admission: AD | Admit: 2018-11-15 | Discharge: 2018-11-15 | Disposition: A | Discharge status: Home / Self Care | Payer: BLUE CROSS/BLUE SHIELD | Source: Ambulatory Visit | Attending: Obstetrics and Gynecology | Admitting: Obstetrics and Gynecology

## 2018-11-15 DIAGNOSIS — W108XXA Fall (on) (from) other stairs and steps, initial encounter: Secondary | ICD-10-CM

## 2018-11-15 DIAGNOSIS — Z87891 Personal history of nicotine dependence: Secondary | ICD-10-CM | POA: Insufficient documentation

## 2018-11-15 DIAGNOSIS — O26892 Other specified pregnancy related conditions, second trimester: Secondary | ICD-10-CM | POA: Diagnosis not present

## 2018-11-15 DIAGNOSIS — Z880 Allergy status to penicillin: Secondary | ICD-10-CM | POA: Insufficient documentation

## 2018-11-15 DIAGNOSIS — W109XXA Fall (on) (from) unspecified stairs and steps, initial encounter: Secondary | ICD-10-CM | POA: Insufficient documentation

## 2018-11-15 DIAGNOSIS — M549 Dorsalgia, unspecified: Secondary | ICD-10-CM | POA: Insufficient documentation

## 2018-11-15 DIAGNOSIS — O9A219 Injury, poisoning and certain other consequences of external causes complicating pregnancy, unspecified trimester: Secondary | ICD-10-CM | POA: Diagnosis not present

## 2018-11-15 DIAGNOSIS — O9989 Other specified diseases and conditions complicating pregnancy, childbirth and the puerperium: Secondary | ICD-10-CM

## 2018-11-15 DIAGNOSIS — Z3A22 22 weeks gestation of pregnancy: Secondary | ICD-10-CM | POA: Diagnosis not present

## 2018-11-15 DIAGNOSIS — R109 Unspecified abdominal pain: Secondary | ICD-10-CM | POA: Diagnosis not present

## 2018-11-15 LAB — URINALYSIS, ROUTINE W REFLEX MICROSCOPIC
Bilirubin Urine: NEGATIVE
GLUCOSE, UA: NEGATIVE mg/dL
Ketones, ur: 15 mg/dL — AB
LEUKOCYTES UA: NEGATIVE
Nitrite: NEGATIVE
PH: 7 (ref 5.0–8.0)
PROTEIN: 100 mg/dL — AB
Specific Gravity, Urine: 1.02 (ref 1.005–1.030)

## 2018-11-15 LAB — KLEIHAUER-BETKE STAIN
# VIALS RHIG: 1
Fetal Cells %: 0 %
Quantitation Fetal Hemoglobin: 0 mL

## 2018-11-15 LAB — CBC WITH DIFFERENTIAL/PLATELET
BASOS PCT: 0 %
Basophils Absolute: 0 10*3/uL (ref 0.0–0.1)
EOS PCT: 2 %
Eosinophils Absolute: 0.2 10*3/uL (ref 0.0–0.5)
HEMATOCRIT: 30.9 % — AB (ref 36.0–46.0)
Hemoglobin: 10.1 g/dL — ABNORMAL LOW (ref 12.0–15.0)
Lymphocytes Relative: 21 %
Lymphs Abs: 2.3 10*3/uL (ref 0.7–4.0)
MCH: 29.4 pg (ref 26.0–34.0)
MCHC: 32.7 g/dL (ref 30.0–36.0)
MCV: 90.1 fL (ref 80.0–100.0)
MONOS PCT: 10 %
Monocytes Absolute: 1 10*3/uL (ref 0.1–1.0)
NEUTROS ABS: 7.3 10*3/uL (ref 1.7–7.7)
Neutrophils Relative %: 67 %
PLATELETS: 306 10*3/uL (ref 150–400)
RBC: 3.43 MIL/uL — AB (ref 3.87–5.11)
RDW: 14.6 % (ref 11.5–15.5)
WBC: 10.9 10*3/uL — AB (ref 4.0–10.5)
nRBC: 0 % (ref 0.0–0.2)

## 2018-11-15 LAB — URINALYSIS, MICROSCOPIC (REFLEX)

## 2018-11-15 MED ORDER — ACETAMINOPHEN 500 MG PO TABS
1000.0000 mg | ORAL_TABLET | Freq: Once | ORAL | Status: AC
  Administered 2018-11-15: 1000 mg via ORAL
  Filled 2018-11-15: qty 2

## 2018-11-15 NOTE — MAU Provider Note (Signed)
History     Patient Active Problem List   Diagnosis Date Noted  . Acute viral syndrome 02/26/2017  . Positive GBS test 04/25/2016  . Vaginal delivery 03/30/2016  . Vitamin D deficiency 08/15/2015  . Susceptible varicella--non-immune 08/15/2015  . Rh negative, maternal - received Rhophylac on 08/04/15 due to spotting 08/14/2015  . Rubella non-immune status, antepartum 08/14/2015  . Allergy to penicillin - rash, hives -- required hospitalization 08/14/2015  . Allergy to amoxicillin -- rash, hives 08/14/2015    Chief Complaint  Patient presents with  . Fall  . Abdominal Pain  . Back Pain   0530 this morning Peggy Mason was walking down stairs and slipped and landed on bottom, slid down three steps on her bottom. She proceeded to work but pain has only increased throughout the day. Worse on the left, worse with bending, sitting, cannot lie on her back. 10/10 pain with movement. 5/10 while side lying. Denies VB, LOF. States FM+ but decreased from normal. Has not taken anything for pain.   OB History    Gravida  3   Para  2   Term  2   Preterm  0   AB  0   Living  2     SAB  0   TAB  0   Ectopic  0   Multiple  0   Live Births  2           Past Medical History:  Diagnosis Date  . Acne   . Anemia   . History of chlamydia infection     Past Surgical History:  Procedure Laterality Date  . HERNIA REPAIR     umbilical  . WISDOM TOOTH EXTRACTION      Family History  Problem Relation Age of Onset  . Diabetes Paternal Grandmother   . Asthma Sister   . Learning disabilities Sister   . Kidney disease Sister        dialysis    Social History   Tobacco Use  . Smoking status: Former Smoker    Types: Cigars  . Smokeless tobacco: Never Used  . Tobacco comment: not smoked since pregnant, passive smoker  Substance Use Topics  . Alcohol use: Not Currently  . Drug use: Yes    Types: Marijuana    Comment: not since pregnant    Allergies:  Allergies   Allergen Reactions  . Amoxicillin Hives  . Penicillins Hives    Has patient had a PCN reaction causing immediate rash, facial/tongue/throat swelling, SOB or lightheadedness with hypotension: Yes Has patient had a PCN reaction causing severe rash involving mucus membranes or skin necrosis: No Has patient had a PCN reaction that required hospitalization Yes Has patient had a PCN reaction occurring within the last 10 years: No If all of the above answers are "NO", then may proceed with Cephalosporin use.    Medications Prior to Admission  Medication Sig Dispense Refill Last Dose  . metoCLOPramide (REGLAN) 10 MG tablet Take 1 tablet (10 mg total) by mouth every 6 (six) hours as needed for nausea. 30 tablet 0   . polyethylene glycol powder (GLYCOLAX/MIRALAX) powder Take 17 g by mouth daily. 500 g 0   . promethazine (PHENERGAN) 25 MG suppository Place 1 suppository (25 mg total) rectally every 6 (six) hours as needed for nausea. 12 suppository 0   . scopolamine (TRANSDERM-SCOP) 1 MG/3DAYS Place 1 patch (1.5 mg total) onto the skin every 3 (three) days. 10 patch 2  Review of Systems  Constitutional: Negative.   HENT: Negative.   Eyes: Negative.   Respiratory: Negative.   Cardiovascular: Negative.   Gastrointestinal: Negative.   Genitourinary: Negative.   Musculoskeletal: Negative.   Skin: Negative.   Neurological: Negative.   Endo/Heme/Allergies: Negative.   Psychiatric/Behavioral: Negative.    Physical Exam   Blood pressure 110/60, pulse (!) 103, temperature 98.5 F (36.9 C), resp. rate 18, height 5\' 3"  (1.6 m), weight 88.1 kg, last menstrual period 06/15/2018, unknown if currently breastfeeding.  Results for orders placed or performed during the hospital encounter of 11/15/18 (from the past 24 hour(s))  Urinalysis, Routine w reflex microscopic     Status: Abnormal   Collection Time: 11/15/18  6:25 PM  Result Value Ref Range   Color, Urine YELLOW YELLOW   APPearance CLEAR  CLEAR   Specific Gravity, Urine 1.020 1.005 - 1.030   pH 7.0 5.0 - 8.0   Glucose, UA NEGATIVE NEGATIVE mg/dL   Hgb urine dipstick SMALL (A) NEGATIVE   Bilirubin Urine NEGATIVE NEGATIVE   Ketones, ur 15 (A) NEGATIVE mg/dL   Protein, ur 161100 (A) NEGATIVE mg/dL   Nitrite NEGATIVE NEGATIVE   Leukocytes, UA NEGATIVE NEGATIVE  Urinalysis, Microscopic (reflex)     Status: Abnormal   Collection Time: 11/15/18  6:25 PM  Result Value Ref Range   RBC / HPF 0-5 0 - 5 RBC/hpf   WBC, UA 0-5 0 - 5 WBC/hpf   Bacteria, UA FEW (A) NONE SEEN   Squamous Epithelial / LPF 0-5 0 - 5  Kleihauer-Betke stain     Status: None   Collection Time: 11/15/18  8:00 PM  Result Value Ref Range   Fetal Cells % 0 %   Quantitation Fetal Hemoglobin 0 mL   # Vials RhIg 1   CBC with Differential/Platelet     Status: Abnormal   Collection Time: 11/15/18  8:00 PM  Result Value Ref Range   WBC 10.9 (H) 4.0 - 10.5 K/uL   RBC 3.43 (L) 3.87 - 5.11 MIL/uL   Hemoglobin 10.1 (L) 12.0 - 15.0 g/dL   HCT 09.630.9 (L) 04.536.0 - 40.946.0 %   MCV 90.1 80.0 - 100.0 fL   MCH 29.4 26.0 - 34.0 pg   MCHC 32.7 30.0 - 36.0 g/dL   RDW 81.114.6 91.411.5 - 78.215.5 %   Platelets 306 150 - 400 K/uL   nRBC 0.0 0.0 - 0.2 %   Neutrophils Relative % 67 %   Neutro Abs 7.3 1.7 - 7.7 K/uL   Lymphocytes Relative 21 %   Lymphs Abs 2.3 0.7 - 4.0 K/uL   Monocytes Relative 10 %   Monocytes Absolute 1.0 0.1 - 1.0 K/uL   Eosinophils Relative 2 %   Eosinophils Absolute 0.2 0.0 - 0.5 K/uL   Basophils Relative 0 %   Basophils Absolute 0.0 0.0 - 0.1 K/uL      Physical Exam  Constitutional: She is oriented to person, place, and time. She appears well-developed and well-nourished.  HENT:  Head: Normocephalic and atraumatic.  Neck: Normal range of motion.  Cardiovascular: Normal rate and regular rhythm.  Respiratory: Effort normal.  GI: Soft.  Musculoskeletal: Normal range of motion.  Neurological: She is alert and oriented to person, place, and time.  Skin: Skin is  warm and dry. No rash noted. No erythema.  Psychiatric: She has a normal mood and affect. Her behavior is normal. Judgment and thought content normal.  No bruising or swelling present on lower back or  buttocks.  [place]: WNL, Good FM KB: Negative Xray: Negative for pelvic fx Tylenol 1gm PO given for pain  ED Course  Assessment: 23 y.o. G3P2 at [redacted]w[redacted]d S/p fall down 3 steps at 0545 this a.m. Category 1 tracing Pain improving: Down from 10 to 4 after tylenol  Plan: Discharge home in stable condition Rest for several days, return to work Wednesday Dr. Mora Appl participated in this patient's POC.  Jonetta Speak CNM,  11/15/2018 7:51 PM

## 2018-11-15 NOTE — MAU Note (Signed)
Peggy Mason is a 23 y.o. at [redacted]w[redacted]d here in MAU reporting: fell down this stairs this AM around 0530. Reports she is having back in her lower back. Also started having abdominal pain. No bleeding.States she has felt him move but it's not the way he normally moves per pt.  Onset of complaint: today  Pain score: back 7/10, abdomen 7/10  Vitals:   11/15/18 1816  BP: 110/60  Pulse: (!) 103  Resp: 18  Temp: 98.5 F (36.9 C)      UXY:BFXOVANVB, pt unable to lay back due to pain  Lab orders placed from triage: UA

## 2019-02-06 IMAGING — CR DG PELVIS 1-2V
1 series · 1 of 1 positions shown · non-contrast
Comparison: None.

CLINICAL DATA: Status post fall down the steps. Coccygeal pain.
Fall down 3 steps today landing on coccyx, pain is [DATE]. Pt is 22
weeks pregnant and has signed consent

EXAM:
PELVIS - 1-2 VIEW

[pelvis ap]
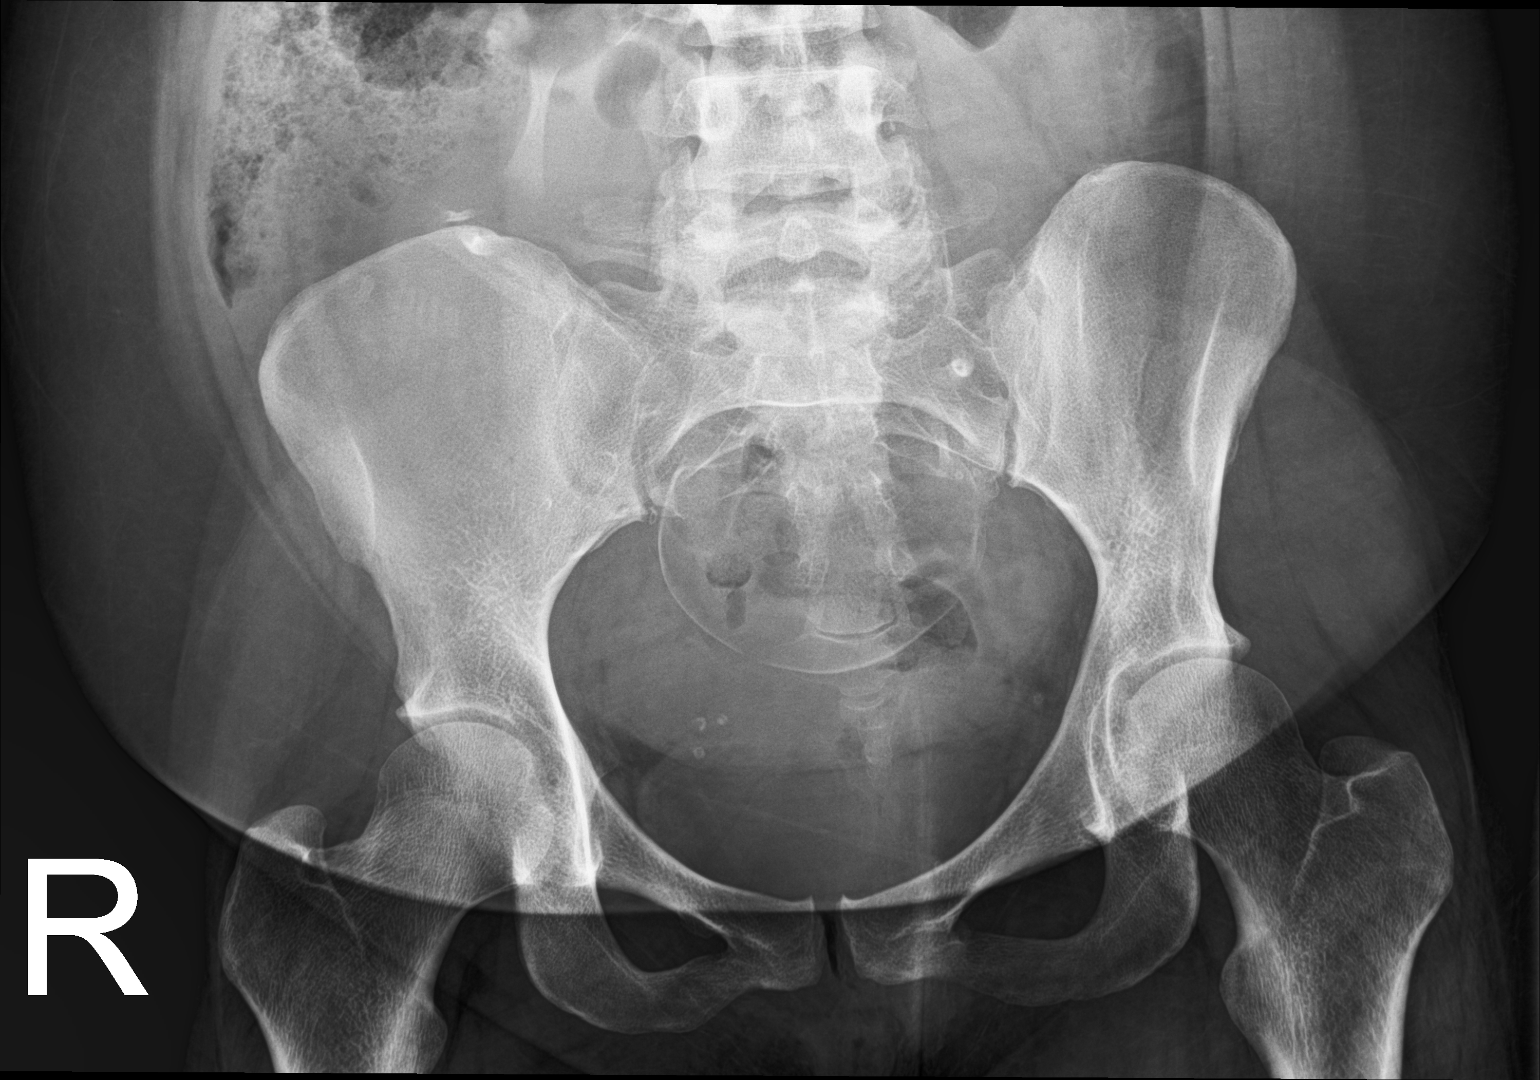

[1 of 1 positions shown; findings below may reference images not displayed]

FINDINGS: There is no evidence of pelvic fracture or diastasis. No pelvic bone
lesions are seen.

Fetus is present in the pelvis with partially calcified calvarium
projecting over the sacrum.
IMPRESSION: No acute osseous injury of the pelvis.

## 2019-03-11 ENCOUNTER — Inpatient Hospital Stay (HOSPITAL_COMMUNITY)
Admission: AD | Admit: 2019-03-11 | Discharge: 2019-03-13 | DRG: 806 | Disposition: A | Discharge status: Home / Self Care | Payer: BLUE CROSS/BLUE SHIELD | Attending: Obstetrics & Gynecology | Admitting: Obstetrics & Gynecology

## 2019-03-11 ENCOUNTER — Inpatient Hospital Stay (HOSPITAL_COMMUNITY): Payer: BLUE CROSS/BLUE SHIELD | Admitting: Anesthesiology

## 2019-03-11 ENCOUNTER — Encounter (HOSPITAL_COMMUNITY): Payer: Self-pay | Admitting: *Deleted

## 2019-03-11 DIAGNOSIS — Z3A38 38 weeks gestation of pregnancy: Secondary | ICD-10-CM

## 2019-03-11 DIAGNOSIS — Z880 Allergy status to penicillin: Secondary | ICD-10-CM

## 2019-03-11 DIAGNOSIS — Z87891 Personal history of nicotine dependence: Secondary | ICD-10-CM

## 2019-03-11 DIAGNOSIS — O99214 Obesity complicating childbirth: Secondary | ICD-10-CM | POA: Diagnosis present

## 2019-03-11 DIAGNOSIS — O9832 Other infections with a predominantly sexual mode of transmission complicating childbirth: Secondary | ICD-10-CM | POA: Diagnosis present

## 2019-03-11 DIAGNOSIS — A6000 Herpesviral infection of urogenital system, unspecified: Secondary | ICD-10-CM | POA: Diagnosis present

## 2019-03-11 DIAGNOSIS — Z6791 Unspecified blood type, Rh negative: Secondary | ICD-10-CM

## 2019-03-11 DIAGNOSIS — Z1159 Encounter for screening for other viral diseases: Secondary | ICD-10-CM | POA: Diagnosis not present

## 2019-03-11 DIAGNOSIS — O26893 Other specified pregnancy related conditions, third trimester: Secondary | ICD-10-CM | POA: Diagnosis present

## 2019-03-11 DIAGNOSIS — O99824 Streptococcus B carrier state complicating childbirth: Secondary | ICD-10-CM | POA: Diagnosis present

## 2019-03-11 LAB — OB RESULTS CONSOLE GBS: GBS: POSITIVE

## 2019-03-11 LAB — TYPE AND SCREEN
ABO/RH(D): O NEG
Antibody Screen: NEGATIVE

## 2019-03-11 LAB — CBC
HCT: 35.6 % — ABNORMAL LOW (ref 36.0–46.0)
Hemoglobin: 12 g/dL (ref 12.0–15.0)
MCH: 28.7 pg (ref 26.0–34.0)
MCHC: 33.7 g/dL (ref 30.0–36.0)
MCV: 85.2 fL (ref 80.0–100.0)
Platelets: 237 10*3/uL (ref 150–400)
RBC: 4.18 MIL/uL (ref 3.87–5.11)
RDW: 14.8 % (ref 11.5–15.5)
WBC: 8.2 10*3/uL (ref 4.0–10.5)
nRBC: 0 % (ref 0.0–0.2)

## 2019-03-11 LAB — SARS CORONAVIRUS 2 BY RT PCR (HOSPITAL ORDER, PERFORMED IN ~~LOC~~ HOSPITAL LAB): SARS Coronavirus 2: NEGATIVE

## 2019-03-11 MED ORDER — LACTATED RINGERS IV SOLN: 500.0000 mL | INTRAVENOUS | Status: DC | PRN

## 2019-03-11 MED ORDER — EPHEDRINE 5 MG/ML INJ: 10.0000 mg | INTRAVENOUS | Status: DC | PRN

## 2019-03-11 MED ORDER — LACTATED RINGERS IV SOLN: 500.0000 mL | Freq: Once | INTRAVENOUS | Status: DC

## 2019-03-11 MED ORDER — FENTANYL CITRATE (PF) 100 MCG/2ML IJ SOLN
50.0000 ug | INTRAMUSCULAR | Status: DC | PRN
  Administered 2019-03-11 (×3): 100 ug via INTRAVENOUS
  Filled 2019-03-11 (×3): qty 2

## 2019-03-11 MED ORDER — LACTATED RINGERS IV SOLN
INTRAVENOUS | Status: DC
  Administered 2019-03-11: 13:00:00 via INTRAVENOUS

## 2019-03-11 MED ORDER — OXYTOCIN 40 UNITS IN NORMAL SALINE INFUSION - SIMPLE MED
2.5000 [IU]/h | INTRAVENOUS | Status: DC
  Administered 2019-03-12: 2.5 [IU]/h via INTRAVENOUS
  Filled 2019-03-11: qty 1000

## 2019-03-11 MED ORDER — CLINDAMYCIN PHOSPHATE 900 MG/50ML IV SOLN
900.0000 mg | Freq: Three times a day (TID) | INTRAVENOUS | Status: DC
  Administered 2019-03-11 (×2): 900 mg via INTRAVENOUS
  Filled 2019-03-11 (×2): qty 50

## 2019-03-11 MED ORDER — LIDOCAINE HCL (PF) 1 % IJ SOLN
INTRAMUSCULAR | Status: DC | PRN
  Administered 2019-03-11 (×2): 4 mL via EPIDURAL

## 2019-03-11 MED ORDER — SOD CITRATE-CITRIC ACID 500-334 MG/5ML PO SOLN: 30.0000 mL | ORAL | Status: DC | PRN

## 2019-03-11 MED ORDER — OXYTOCIN BOLUS FROM INFUSION: 500.0000 mL | Freq: Once | INTRAVENOUS | Status: DC

## 2019-03-11 MED ORDER — ONDANSETRON HCL 4 MG/2ML IJ SOLN
4.0000 mg | Freq: Four times a day (QID) | INTRAMUSCULAR | Status: DC | PRN
  Administered 2019-03-12: 4 mg via INTRAVENOUS
  Filled 2019-03-11: qty 2

## 2019-03-11 MED ORDER — OXYCODONE-ACETAMINOPHEN 5-325 MG PO TABS: 2.0000 | ORAL_TABLET | ORAL | Status: DC | PRN

## 2019-03-11 MED ORDER — OXYCODONE-ACETAMINOPHEN 5-325 MG PO TABS: 1.0000 | ORAL_TABLET | ORAL | Status: DC | PRN

## 2019-03-11 MED ORDER — FENTANYL-BUPIVACAINE-NACL 0.5-0.125-0.9 MG/250ML-% EP SOLN
12.0000 mL/h | EPIDURAL | Status: DC | PRN
  Filled 2019-03-11: qty 250

## 2019-03-11 MED ORDER — DIPHENHYDRAMINE HCL 50 MG/ML IJ SOLN: 12.5000 mg | INTRAMUSCULAR | Status: DC | PRN

## 2019-03-11 MED ORDER — LIDOCAINE HCL (PF) 1 % IJ SOLN: 30.0000 mL | INTRAMUSCULAR | Status: DC | PRN

## 2019-03-11 MED ORDER — PHENYLEPHRINE 40 MCG/ML (10ML) SYRINGE FOR IV PUSH (FOR BLOOD PRESSURE SUPPORT)
80.0000 ug | PREFILLED_SYRINGE | INTRAVENOUS | Status: DC | PRN
  Filled 2019-03-11: qty 10

## 2019-03-11 MED ORDER — ACETAMINOPHEN 325 MG PO TABS: 650.0000 mg | ORAL_TABLET | ORAL | Status: DC | PRN

## 2019-03-11 MED ORDER — PHENYLEPHRINE 40 MCG/ML (10ML) SYRINGE FOR IV PUSH (FOR BLOOD PRESSURE SUPPORT)
80.0000 ug | PREFILLED_SYRINGE | INTRAVENOUS | Status: DC | PRN
  Administered 2019-03-11 (×2): 80 ug via INTRAVENOUS

## 2019-03-11 MED ORDER — SODIUM CHLORIDE (PF) 0.9 % IJ SOLN
INTRAMUSCULAR | Status: DC | PRN
  Administered 2019-03-11: 12 mL/h via EPIDURAL

## 2019-03-11 NOTE — Progress Notes (Signed)
Subjective: Pt requesting epidural.    Objective: BP 105/71   Pulse (!) 107   Temp 98.5 F (36.9 C) (Oral)   Resp 18   Ht 5\' 3"  (1.6 m)   Wt 100.7 kg   LMP 06/15/2018   SpO2 100%   BMI 39.33 kg/m  No intake/output data recorded. No intake/output data recorded.  FHT: Category 1 FHT 135 accels, no decels UC:   regular, every 3 minutes SVE:   Dilation: 8 Effacement (%): 80 Station: -2 Exam by:: Zyah Gomm, CNM  Assessment:  23 year old G3P2002 at 38 weeks 3 days in early active labor Cqt 1 strip  Plan: Epidural for pain Monitor progress  Kenney Houseman CNM, MSN 03/11/2019, 10:53 PM

## 2019-03-11 NOTE — H&P (Signed)
Peggy Mason is a 23 y.o. female G3P2002 at 38 weeks 3 days sent from office for direct admission to labor and delivery for early active labor. Patient with regular painful contractions q 5-6 minutes, +bloody show, no leaking of fluid, reports good fetal movement.  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 Acne   . Anemia   . History of chlamydia infection    Past Surgical History:  Procedure Laterality Date  . HERNIA REPAIR     umbilical  . WISDOM TOOTH EXTRACTION     Family History: family history includes Asthma in her sister; Diabetes in her paternal grandmother; Kidney disease in her sister; Learning disabilities in her sister. Social History:  reports that she has quit smoking. Her smoking use included cigars. She has never used smokeless tobacco. She reports previous alcohol use. She reports current drug use. Drug: Marijuana.     Maternal Diabetes: No Genetic Screening: Normal Maternal Ultrasounds/Referrals: Normal Fetal Ultrasounds or other Referrals:  None Maternal Substance Abuse:  No Significant Maternal Medications:  Meds include: Other: Valtrex Significant Maternal Lab Results:  Lab values include: Group B Strep positive, Rh negative Other Comments:  None  ROS Maternal Medical History:  Reason for admission: Contractions.   Contractions: Onset was 1-2 hours ago.   Frequency: regular.   Duration is approximately 5 minutes.   Perceived severity is strong.    Fetal activity: Perceived fetal activity is normal.   Last perceived fetal movement was within the past hour.    Prenatal complications: Infection.   Prenatal Complications - Diabetes:  none.      Blood pressure 135/86, pulse (!) 101, temperature 98.2 F (36.8 C), temperature source Oral, resp. rate 18, height 5\' 3"  (1.6 m), weight 100.7 kg, last menstrual period 06/15/2018, unknown if currently breastfeeding. Maternal Exam:  Uterine Assessment: Contraction duration is 1 minute. Contraction frequency is regular.   Abdomen: Estimated fetal weight is 3546 grams.   Fetal presentation: vertex  Introitus: Vulva is negative for lesion.  Vagina is negative for ulcerations.  Ferning test: not done.  Nitrazine test: not done.  Pelvis: adequate for delivery.   Cervix: In office 3-4 cm / 70%/-3  Physical Exam  Nursing note and vitals reviewed. Genitourinary:    No vulval lesion noted.     No vaginal ulceration.     Prenatal labs: ABO, Rh:  O negative Antibody:   Rubella: Nonimmune (11/12 0000) RPR: Nonreactive (11/12 0000)  HBsAg: Negative (11/12 0000)  HIV: Non Reactive (10/04 0113)  GBS: Positive (05/28 0000)   Assessment/Plan: 23 year old G3P2002 at 38 weeks 3 days in early active labor Admit to Labor and Delivery Continuous monitoring IV hydration Epidural on demand Clindamycin for GBS prophylaxis Anticipate NSVD  Larren Copes STACIA 03/11/2019, 12:21 PM

## 2019-03-11 NOTE — Anesthesia Preprocedure Evaluation (Signed)
Anesthesia Evaluation  Patient identified by MRN, date of birth, ID band Patient awake    Reviewed: Allergy & Precautions, Patient's Chart, lab work & pertinent test results  Airway Mallampati: II  TM Distance: >3 FB Neck ROM: Full    Dental no notable dental hx. (+) Teeth Intact   Pulmonary former smoker,    Pulmonary exam normal breath sounds clear to auscultation       Cardiovascular negative cardio ROS Normal cardiovascular exam Rhythm:Regular Rate:Normal     Neuro/Psych negative neurological ROS  negative psych ROS   GI/Hepatic Neg liver ROS, GERD  ,  Endo/Other  Morbid obesity  Renal/GU negative Renal ROS  negative genitourinary   Musculoskeletal negative musculoskeletal ROS (+)   Abdominal (+) + obese,   Peds  Hematology  (+) anemia ,   Anesthesia Other Findings   Reproductive/Obstetrics (+) Pregnancy HSV                             Anesthesia Physical Anesthesia Plan  ASA: III  Anesthesia Plan: Epidural   Post-op Pain Management:    Induction:   PONV Risk Score and Plan:   Airway Management Planned: Natural Airway  Additional Equipment:   Intra-op Plan:   Post-operative Plan:   Informed Consent: I have reviewed the patients History and Physical, chart, labs and discussed the procedure including the risks, benefits and alternatives for the proposed anesthesia with the patient or authorized representative who has indicated his/her understanding and acceptance.       Plan Discussed with: Anesthesiologist  Anesthesia Plan Comments:         Anesthesia Quick Evaluation

## 2019-03-11 NOTE — Progress Notes (Signed)
Subjective: Pt coping with contractions.  Request to have her water broken Objective: BP 105/71   Pulse (!) 107   Temp 98.5 F (36.9 C) (Oral)   Resp 18   Ht 5\' 3"  (1.6 m)   Wt 100.7 kg   LMP 06/15/2018   SpO2 100%   BMI 39.33 kg/m  No intake/output data recorded. No intake/output data recorded.  FHT: Category 1 FHT 130 accels, no decels UC:   regular, every 3 minutes SVE:   Dilation: 8 Effacement (%): 80 Station: -2 Exam by:: NancyProthero, CNM  AROM clear fluid Assessment:  G3P2002 at 39.1 IUP in active labor Cat 1 strip GBS tx with Clindamycin  Plan: Monitor progress   Kenney Houseman CNM, MSN 03/11/2019, 10:56 PM

## 2019-03-11 NOTE — Anesthesia Procedure Notes (Signed)
Epidural Patient location during procedure: OB Start time: 03/11/2019 10:28 PM End time: 03/11/2019 10:37 PM  Staffing Anesthesiologist: Mal Amabile, MD Performed: anesthesiologist   Preanesthetic Checklist Completed: patient identified, site marked, surgical consent, pre-op evaluation, timeout performed, IV checked, risks and benefits discussed and monitors and equipment checked  Epidural Patient position: sitting Prep: site prepped and draped and DuraPrep Patient monitoring: continuous pulse ox and blood pressure Approach: midline Location: L3-L4 Injection technique: LOR air  Needle:  Needle type: Tuohy  Needle gauge: 17 G Needle length: 9 cm and 9 Needle insertion depth: 9 cm Catheter type: closed end flexible Catheter size: 19 Gauge Catheter at skin depth: 15 cm Test dose: negative and Other  Assessment Events: blood not aspirated, injection not painful, no injection resistance, negative IV test and no paresthesia  Additional Notes Patient identified. Risks and benefits discussed including failed block, incomplete  Pain control, post dural puncture headache, nerve damage, paralysis, blood pressure Changes, nausea, vomiting, reactions to medications-both toxic and allergic and post Partum back pain. All questions were answered. Patient expressed understanding and wished to proceed. Sterile technique was used throughout procedure. Epidural site was Dressed with sterile barrier dressing. No paresthesias, signs of intravascular injection Or signs of intrathecal spread were encountered.  Patient was more comfortable after the epidural was dosed. Please see RN's note for documentation of vital signs and FHR which are stable. Reason for block:procedure for pain

## 2019-03-12 ENCOUNTER — Encounter (HOSPITAL_COMMUNITY): Payer: Self-pay

## 2019-03-12 ENCOUNTER — Other Ambulatory Visit: Payer: Self-pay

## 2019-03-12 LAB — ABO/RH: ABO/RH(D): O NEG

## 2019-03-12 LAB — CBC
HCT: 30.9 % — ABNORMAL LOW (ref 36.0–46.0)
Hemoglobin: 10.3 g/dL — ABNORMAL LOW (ref 12.0–15.0)
MCH: 29.1 pg (ref 26.0–34.0)
MCHC: 33.3 g/dL (ref 30.0–36.0)
MCV: 87.3 fL (ref 80.0–100.0)
Platelets: 219 10*3/uL (ref 150–400)
RBC: 3.54 MIL/uL — ABNORMAL LOW (ref 3.87–5.11)
RDW: 14.9 % (ref 11.5–15.5)
WBC: 16.6 10*3/uL — ABNORMAL HIGH (ref 4.0–10.5)
nRBC: 0 % (ref 0.0–0.2)

## 2019-03-12 LAB — RPR: RPR Ser Ql: NONREACTIVE

## 2019-03-12 MED ORDER — BENZOCAINE-MENTHOL 20-0.5 % EX AERO: 1.0000 "application " | INHALATION_SPRAY | CUTANEOUS | Status: DC | PRN

## 2019-03-12 MED ORDER — SENNOSIDES-DOCUSATE SODIUM 8.6-50 MG PO TABS
2.0000 | ORAL_TABLET | ORAL | Status: DC
  Administered 2019-03-12: 2 via ORAL
  Filled 2019-03-12: qty 2

## 2019-03-12 MED ORDER — COCONUT OIL OIL: 1.0000 "application " | TOPICAL_OIL | Status: DC | PRN

## 2019-03-12 MED ORDER — ZOLPIDEM TARTRATE 5 MG PO TABS: 5.0000 mg | ORAL_TABLET | Freq: Every evening | ORAL | Status: DC | PRN

## 2019-03-12 MED ORDER — RHO D IMMUNE GLOBULIN 1500 UNIT/2ML IJ SOSY
300.0000 ug | PREFILLED_SYRINGE | Freq: Once | INTRAMUSCULAR | Status: AC
  Administered 2019-03-12: 300 ug via INTRAVENOUS
  Filled 2019-03-12: qty 2

## 2019-03-12 MED ORDER — WITCH HAZEL-GLYCERIN EX PADS: 1.0000 "application " | MEDICATED_PAD | CUTANEOUS | Status: DC | PRN

## 2019-03-12 MED ORDER — MISOPROSTOL 200 MCG PO TABS
ORAL_TABLET | ORAL | Status: AC
  Administered 2019-03-12: 8000 ug
  Filled 2019-03-12: qty 4

## 2019-03-12 MED ORDER — TETANUS-DIPHTH-ACELL PERTUSSIS 5-2.5-18.5 LF-MCG/0.5 IM SUSP: 0.5000 mL | Freq: Once | INTRAMUSCULAR | Status: DC

## 2019-03-12 MED ORDER — PRENATAL MULTIVITAMIN CH
1.0000 | ORAL_TABLET | Freq: Every day | ORAL | Status: DC
  Administered 2019-03-12 – 2019-03-13 (×2): 1 via ORAL
  Filled 2019-03-12 (×2): qty 1

## 2019-03-12 MED ORDER — METHYLERGONOVINE MALEATE 0.2 MG/ML IJ SOLN
INTRAMUSCULAR | Status: AC
  Administered 2019-03-12: 0.2 mg
  Filled 2019-03-12: qty 1

## 2019-03-12 MED ORDER — IBUPROFEN 600 MG PO TABS
600.0000 mg | ORAL_TABLET | Freq: Four times a day (QID) | ORAL | Status: DC
  Administered 2019-03-12 – 2019-03-13 (×6): 600 mg via ORAL
  Filled 2019-03-12 (×6): qty 1

## 2019-03-12 MED ORDER — ONDANSETRON HCL 4 MG PO TABS: 4.0000 mg | ORAL_TABLET | ORAL | Status: DC | PRN

## 2019-03-12 MED ORDER — DIBUCAINE (PERIANAL) 1 % EX OINT: 1.0000 "application " | TOPICAL_OINTMENT | CUTANEOUS | Status: DC | PRN

## 2019-03-12 MED ORDER — SIMETHICONE 80 MG PO CHEW: 80.0000 mg | CHEWABLE_TABLET | ORAL | Status: DC | PRN

## 2019-03-12 MED ORDER — ONDANSETRON HCL 4 MG/2ML IJ SOLN: 4.0000 mg | INTRAMUSCULAR | Status: DC | PRN

## 2019-03-12 MED ORDER — ACETAMINOPHEN 325 MG PO TABS
650.0000 mg | ORAL_TABLET | ORAL | Status: DC | PRN
  Administered 2019-03-12 – 2019-03-13 (×3): 650 mg via ORAL
  Filled 2019-03-12 (×3): qty 2

## 2019-03-12 MED ORDER — DIPHENHYDRAMINE HCL 25 MG PO CAPS: 25.0000 mg | ORAL_CAPSULE | Freq: Four times a day (QID) | ORAL | Status: DC | PRN

## 2019-03-12 NOTE — Anesthesia Postprocedure Evaluation (Signed)
Anesthesia Post Note  Patient: Peggy Mason  Procedure(s) Performed: AN AD HOC LABOR EPIDURAL     Patient location during evaluation: Mother Baby Anesthesia Type: Epidural Level of consciousness: awake and alert Pain management: pain level controlled Vital Signs Assessment: post-procedure vital signs reviewed and stable Respiratory status: spontaneous breathing, nonlabored ventilation and respiratory function stable Cardiovascular status: stable Postop Assessment: no headache, no backache, epidural receding, no apparent nausea or vomiting, patient able to bend at knees, able to ambulate and adequate PO intake Anesthetic complications: no    Last Vitals:  Vitals:   03/12/19 0320 03/12/19 0434  BP: 113/68 122/82  Pulse: 92 (!) 120  Resp: 18 18  Temp: (!) 38.3 C (!) 38.5 C  SpO2: 100% 99%    Last Pain:  Vitals:   03/12/19 0434  TempSrc: Oral  PainSc: 0-No pain   Pain Goal:                   [place]

## 2019-03-12 NOTE — Lactation Note (Signed)
This note was copied from a baby's chart. Lactation Consultation Note  Patient Name: Peggy Mason [id number] Date: 03/12/2019 Reason for consult: Initial assessment;Early term 37-38.6wks P3, 21 hour female infant, C/S delivery. Infant had one stool since delivery. Mom is experienced at breastfeeding she breastfeed her eldest daughter 6 years ( for one year) and 25 year old son for (9 months). Per mom, she doesn't have breast pump at home.  LC gave hand pump per prn, mom shown how to use harmony hand pump and fitted with 27 mm flange, mom was shown how to  assemble. disassemble, clean, & reassemble parts. Mom is active on [facility] program in [place]. 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 Per mom she has breastfeed infant 5x since delivery. Per mom, she feels breastfeeding is going well and infant is latching without difficulty. Per mom, she breastfeed infant less than one hour prior to [facility] entering the room. LC discussed hand expression, mom wanted to review and taught back, infant was given 5 ml of colostrum by spoon. Mom has partially  inverted nipple  (dimpling) on left breast but infant is latching well per mom. Mom knows to breastfeed according hunger cues, 8 -12 times within 24 hours, breastfeed on demand. Mom knows to call Nurse or LC if she has any questions, concerns or need assistance with latching infant to breast. Mom made aware of O/P services, breastfeeding support groups, community resources, and our phone # for post-discharge questions.    Maternal Data Formula Feeding for Exclusion: No Has patient been taught Hand Expression?: Yes(Infant given 5 ml of colostrum by spoon.)  Feeding Feeding Type: Breast Fed  LATCH Score Latch: Grasps breast easily, tongue down, lips flanged, rhythmical sucking.  Audible Swallowing: A few with stimulation  Type of Nipple: Everted at rest and after stimulation  Comfort (Breast/Nipple): Soft / non-tender  Hold  (Positioning): No assistance needed to correctly position infant at breast.  LATCH Score: 9  Interventions Interventions: Breast feeding basics reviewed;Skin to skin;Position options;Hand pump;Hand express;Expressed milk  Lactation Tools Discussed/Used Pump Review: Setup, frequency, and cleaning;Milk Storage Initiated by:: Danelle Earthly, IBCLC Date initiated:: 03/12/19   Consult Status Consult Status: Follow-up Date: 03/12/19 Follow-up type: In-patient    Danelle Earthly 03/12/2019, 10:11 PM

## 2019-03-13 LAB — RH IG WORKUP (INCLUDES ABO/RH)
ABO/RH(D): O NEG
Fetal Screen: NEGATIVE
Gestational Age(Wks): 39
Unit division: 0

## 2019-03-13 MED ORDER — IBUPROFEN 600 MG PO TABS: 600.0000 mg | ORAL_TABLET | Freq: Four times a day (QID) | ORAL | 0 refills | Status: DC

## 2019-03-13 NOTE — Progress Notes (Signed)
[place] about MMR vaccine, Pt is also Varicella Nonimmune. FNP states that she she can get both vaccinations outpatient . Pt states she will get both vaccinations outpatient , so MMR vaccine will not be administered while she is here

## 2019-03-13 NOTE — Lactation Note (Signed)
This note was copied from a baby's chart. Lactation Consultation Note  Patient Name: Peggy Mason [id number] Date: 03/13/2019 Reason for consult: Follow-up assessment   P3, Baby 32 hours old.  Ex BF. Mother denies questions or concerns. Feed on demand approximately 8-12 times per day.   Reviewed engorgement care and monitoring voids/stools.    Maternal Data    Feeding Feeding Type: Breast Fed  LATCH Score Latch: Grasps breast easily, tongue down, lips flanged, rhythmical sucking.  Audible Swallowing: A few with stimulation  Type of Nipple: Everted at rest and after stimulation  Comfort (Breast/Nipple): Soft / non-tender  Hold (Positioning): No assistance needed to correctly position infant at breast.  LATCH Score: 9  Interventions Interventions: Breast feeding basics reviewed  Lactation Tools Discussed/Used     Consult Status Consult Status: Complete Date: 03/13/19    Dahlia Byes [facility] 03/13/2019, 9:08 AM

## 2019-03-13 NOTE — Discharge Summary (Addendum)
SVD OB Discharge Summary     Patient Name: Peggy Mason DOB: June 09, 1996 MRN: [id number]  Date of admission: 03/11/2019 Delivering MD: Starla Link  Date of delivery: 03/12/2019 Type of delivery: SVD  Newborn Data: Sex: Baby female  Circumcision: out pt desired, call [facility] within 1-2 weeks for circ Live born female  Birth Weight: 8 lb 11.7 oz (3960 g) APGAR: 6, 8  Newborn Delivery   Birth date/time:  03/12/2019 00:57:00 Delivery type:  Vaginal, Spontaneous     Feeding: breast Infant being discharge to home with mother in stable condition.   Admitting diagnosis: LABOR Intrauterine pregnancy: [redacted]w[redacted]d    Secondary diagnosis:  Active Problems:   SVD (spontaneous vaginal delivery)   Labor and delivery, indication for care   Normal postpartum course                                Complications: None                                                              Intrapartum Procedures: spontaneous vaginal delivery and GBS prophylaxis Postpartum Procedures: none, Rho(D) Ig and will need MRR and vareicella in office PP Complications-Operative and Postpartum: none Augmentation: AROM   History of Present Illness: Ms. Peggy FITZSIMMONSis a 23y.o. female, G3P3003, who presents at 378w2deeks gestation. [facility]  Her pregnancy has been complicated by:  Patient Active Problem List   Diagnosis Date Noted  . Normal postpartum course 03/13/2019  . Labor and delivery, indication for care 03/11/2019  . Acute viral syndrome 02/26/2017  . Positive GBS test 04/25/2016  . SVD (spontaneous vaginal delivery) 03/30/2016  . Vitamin D deficiency 08/15/2015  . Susceptible varicella--non-immune 08/15/2015  . Rh negative, maternal - received Rhophylac on 08/04/15 due to spotting 08/14/2015  . Rubella non-immune status, antepartum 08/14/2015  . Allergy to penicillin - rash, hives -- required hospitalization 08/14/2015  .  Allergy to amoxicillin -- rash, hives 08/14/2015    Hospital course:  Onset of Labor With Vaginal Delivery     [age].o. yo G3P3003 at 3976w2ds admitted in Active Labor on 03/11/2019. Patient had an uncomplicated labor course as follows: Peggy Mason a 22 38o. female G3P2002 at 38 weeks 3 days sent from office for direct admission to labor and delivery for early active labor. Patient with regular painful contractions q 5-6 minutes, +bloody show, no leaking of fluid, reports good fetal movement.  Prenatal care c/b: GBS pos PCN allergy sensitive to Clindamycin Rh Negative HSV 2 positive on prophylaxis Rubella and Varicella Non-immune  Membrane Rupture Time/Date: 7:52 PM ,03/11/2019   Intrapartum Procedures: Episiotomy: None [1]                                         Lacerations:  None [1]  Patient had a delivery of a Viable infant. 03/12/2019  Information for the patient's newborn:  Peggy, [place]3[[id number]]elivery Method: Vag-Spont    Pateint had an uncomplicated postpartum course.  She is ambulating,  tolerating a regular diet, passing flatus, and urinating well. Patient is discharged home in stable condition on 03/13/19.  Postpartum Day # 1 : S/P NSVD due to spontaneous labor. Patient up ad lib, denies syncope or dizziness. Reports consuming regular diet without issues and denies N/V. Patient reports 0 bowel movement + passing flatus.  Denies issues with urination and reports bleeding is "lighter."  Patient is breastfeeding and reports going well.  Desires PP tubal within 6 week PP for postpartum contraception per pts request.  Pain is being appropriately managed with use of po meds. Pt request early discharge to homoe in stable condition today   Physical exam  Vitals:   03/12/19 1155 03/12/19 1455 03/12/19 2242 03/13/19 0515  BP: 128/84 120/74 112/73 116/70  Pulse: 82 83 89 85  Resp: '18 18 18 18  '$ Temp: 98.5 F (36.9 C) 99 F (37.2 C) 98.6 F (37 C) 98 F (36.7 C)   TempSrc: Oral Oral  Oral  SpO2: 99% 99% 100% 100%  Weight:      Height:       General: alert, cooperative and no distress Lochia: appropriate Uterine Fundus: firm Perineum: Intact DVT Evaluation: No evidence of DVT seen on physical exam. Negative Homan's sign. No cords or calf tenderness. No significant calf/ankle edema.  Labs: Lab Results  Component Value Date   WBC 16.6 (H) 03/12/2019   HGB 10.3 (L) 03/12/2019   HCT 30.9 (L) 03/12/2019   MCV 87.3 03/12/2019   PLT 219 03/12/2019   CMP Latest Ref Rng & Units 08/07/2018  Glucose 70 - 99 mg/dL 74  BUN 6 - 20 mg/dL 8  Creatinine 0.44 - 1.00 mg/dL 0.47  Sodium 135 - 145 mmol/L 135  Potassium 3.5 - 5.1 mmol/L 3.8  Chloride 98 - 111 mmol/L 106  CO2 22 - 32 mmol/L 21(L)  Calcium 8.9 - 10.3 mg/dL 9.1  Total Protein 6.5 - 8.1 g/dL 6.3(L)  Total Bilirubin 0.3 - 1.2 mg/dL 0.5  Alkaline Phos 38 - 126 U/L 58  AST 15 - 41 U/L 16  ALT 0 - 44 U/L 20    Date of discharge: 03/13/2019 Discharge Diagnoses: Term Pregnancy-delivered Discharge instruction: per After Visit Summary and "Baby and Me Booklet".  After visit meds:    Activity:           unrestricted and pelvic rest Advance as tolerated. Pelvic rest for 6 weeks.  Diet:                routine Medications: PNV, Ibuprofen and Colace Postpartum contraception: Tubal Ligation @ 6 weeks PP Condition:  Pt discharge to home with baby in stable Circ to be perform out pt, MMR and Varicella to be given at same time out pt.   Meds: Allergies as of 03/13/2019      Reactions   Amoxicillin Hives   Penicillins Hives   Has patient had a PCN reaction causing immediate rash, facial/tongue/throat swelling, SOB or lightheadedness with hypotension: Yes Has patient had a PCN reaction causing severe rash involving mucus membranes or skin necrosis: No Has patient had a PCN reaction that required hospitalization Yes Has patient had a PCN reaction occurring within the last 10 years: No If all of  the above answers are "NO", then may proceed with Cephalosporin use.      Medication List    TAKE these medications   ferrous sulfate 325 (65 FE) MG tablet Take 325 mg by mouth daily with breakfast.   ibuprofen 600 MG  tablet Commonly known as:  ADVIL Take 1 tablet (600 mg total) by mouth every 6 (six) hours.   valACYclovir 500 MG tablet Commonly known as:  VALTREX Take 500 mg by mouth daily.       Discharge Follow Up:  [facility] Follow up in 6 week(s).   Specialty:  Obstetrics and Gynecology Why:  PPV and please call to schedule PP tubal  Contact information: [address] [place] [address] [place], NP-C, CNM 03/13/2019, 8:49 AM  Noralyn Pick, [place]

## 2019-03-16 ENCOUNTER — Telehealth: Payer: Self-pay

## 2019-03-16 DIAGNOSIS — Z20822 Contact with and (suspected) exposure to covid-19: Secondary | ICD-10-CM

## 2019-03-16 NOTE — Telephone Encounter (Signed)
Phone call to pt.  Left voice message to return call back to # [phone] to discuss recent hospital stay.  (pt. Had poss. exposure to COVID 19 by one of staff members, while in the hospital)  Attempted to reach pt. On home # [phone]; pt's. mother answered the phone, stating this was not the patient's phone number.  Left message with mother to have pt. Call [phone] to discuss recent hospital stay.   Mother agreed to give message to pt.

## 2019-03-17 ENCOUNTER — Other Ambulatory Visit: Payer: BLUE CROSS/BLUE SHIELD

## 2019-03-17 DIAGNOSIS — Z20822 Contact with and (suspected) exposure to covid-19: Secondary | ICD-10-CM

## 2019-03-17 NOTE — Telephone Encounter (Signed)
Pt. Returned call.  Was advised of possible exposure to COVID 19 while recently hospitalized for labor and delivery.  Reassured pt. That likely a low risk, but that we want to offer to screen her for COVID 19.  Pt. Questioned about her baby boy being tested?  Advised will also test him at same time.  Instructed to wear mask, and drive up to test site, and remain in car.  Verb. Understanding.  Agreed with plan.

## 2019-03-17 NOTE — Telephone Encounter (Signed)
Attempted to call pt. at [phone] (nonworking number), and at [phone].  Left voice message at the 2nd number, advising pt. To call back, to discuss possible exposure to COVID 19, during recent hospital stay.  Requested pt. To return call to [phone].

## 2019-03-17 NOTE — Addendum Note (Signed)
Addended by: Phillips Odor on: 03/17/2019 10:44 AM   Modules accepted: Orders

## 2019-03-19 LAB — NOVEL CORONAVIRUS, NAA: SARS-CoV-2, NAA: NOT DETECTED

## 2019-04-24 ENCOUNTER — Other Ambulatory Visit (HOSPITAL_COMMUNITY): Payer: BC Managed Care – PPO | Attending: Obstetrics and Gynecology

## 2019-04-28 ENCOUNTER — Ambulatory Visit (HOSPITAL_BASED_OUTPATIENT_CLINIC_OR_DEPARTMENT_OTHER): Admit: 2019-04-28 | Payer: BC Managed Care – PPO | Admitting: Obstetrics and Gynecology

## 2019-04-28 ENCOUNTER — Encounter (HOSPITAL_BASED_OUTPATIENT_CLINIC_OR_DEPARTMENT_OTHER): Payer: Self-pay

## 2019-04-28 SURGERY — LIGATION, FALLOPIAN TUBE, LAPAROSCOPIC
Anesthesia: General | Laterality: Bilateral

## 2020-10-16 ENCOUNTER — Other Ambulatory Visit: Payer: Self-pay

## 2020-10-16 ENCOUNTER — Ambulatory Visit (HOSPITAL_COMMUNITY)
Admission: EM | Admit: 2020-10-16 | Discharge: 2020-10-16 | Disposition: A | Discharge status: Home / Self Care | Payer: Medicaid Other | Attending: Urgent Care | Admitting: Urgent Care

## 2020-10-16 ENCOUNTER — Encounter (HOSPITAL_COMMUNITY): Payer: Self-pay

## 2020-10-16 DIAGNOSIS — R519 Headache, unspecified: Secondary | ICD-10-CM | POA: Diagnosis not present

## 2020-10-16 MED ORDER — TIZANIDINE HCL 4 MG PO TABS: 4.0000 mg | ORAL_TABLET | Freq: Every day | ORAL | 0 refills | Status: DC

## 2020-10-16 MED ORDER — NAPROXEN 500 MG PO TABS: 500.0000 mg | ORAL_TABLET | Freq: Two times a day (BID) | ORAL | 0 refills | Status: DC
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
## 2021-08-15 ENCOUNTER — Other Ambulatory Visit: Payer: Self-pay

## 2021-08-15 ENCOUNTER — Encounter (HOSPITAL_COMMUNITY): Payer: Self-pay | Admitting: Obstetrics & Gynecology

## 2021-08-15 ENCOUNTER — Inpatient Hospital Stay (HOSPITAL_COMMUNITY)
Admission: EM | Admit: 2021-08-15 | Discharge: 2021-08-15 | Disposition: A | Discharge status: Home / Self Care | Payer: Medicaid Other | Attending: Obstetrics & Gynecology | Admitting: Obstetrics & Gynecology

## 2021-08-15 ENCOUNTER — Inpatient Hospital Stay (HOSPITAL_COMMUNITY): Payer: Medicaid Other

## 2021-08-15 DIAGNOSIS — O26891 Other specified pregnancy related conditions, first trimester: Secondary | ICD-10-CM | POA: Diagnosis not present

## 2021-08-15 DIAGNOSIS — M549 Dorsalgia, unspecified: Secondary | ICD-10-CM | POA: Diagnosis not present

## 2021-08-15 DIAGNOSIS — R102 Pelvic and perineal pain: Secondary | ICD-10-CM | POA: Diagnosis not present

## 2021-08-15 DIAGNOSIS — M79606 Pain in leg, unspecified: Secondary | ICD-10-CM | POA: Diagnosis not present

## 2021-08-15 DIAGNOSIS — Z3A01 Less than 8 weeks gestation of pregnancy: Secondary | ICD-10-CM | POA: Insufficient documentation

## 2021-08-15 DIAGNOSIS — R109 Unspecified abdominal pain: Secondary | ICD-10-CM | POA: Diagnosis not present

## 2021-08-15 DIAGNOSIS — O200 Threatened abortion: Secondary | ICD-10-CM | POA: Insufficient documentation

## 2021-08-15 DIAGNOSIS — Z880 Allergy status to penicillin: Secondary | ICD-10-CM | POA: Diagnosis not present

## 2021-08-15 DIAGNOSIS — Z87891 Personal history of nicotine dependence: Secondary | ICD-10-CM | POA: Insufficient documentation

## 2021-08-15 DIAGNOSIS — Z79899 Other long term (current) drug therapy: Secondary | ICD-10-CM | POA: Diagnosis not present

## 2021-08-15 LAB — URINALYSIS, ROUTINE W REFLEX MICROSCOPIC
Bilirubin Urine: NEGATIVE
Glucose, UA: NEGATIVE mg/dL
Hgb urine dipstick: NEGATIVE
Ketones, ur: 5 mg/dL — AB
Leukocytes,Ua: NEGATIVE
Nitrite: NEGATIVE
Protein, ur: NEGATIVE mg/dL
Specific Gravity, Urine: 1.021 (ref 1.005–1.030)
pH: 7 (ref 5.0–8.0)

## 2021-08-15 LAB — HCG, QUANTITATIVE, PREGNANCY: hCG, Beta Chain, Quant, S: 1174 m[IU]/mL — ABNORMAL HIGH (ref <5)

## 2021-08-15 LAB — WET PREP, GENITAL
Clue Cells Wet Prep HPF POC: NONE SEEN
Trich, Wet Prep: NONE SEEN
Yeast Wet Prep HPF POC: NONE SEEN

## 2021-08-15 LAB — POCT PREGNANCY, URINE: Preg Test, Ur: POSITIVE — AB

## 2021-08-15 NOTE — MAU Provider Note (Signed)
History     CSN: [id number]  Arrival date and time: 08/15/21 1318   Event Date/Time   First Provider Initiated Contact with Patient 08/15/21 1722      Chief Complaint  Patient presents with   Vaginal Discharge   Pelvic Pain   Peggy Mason is a 25 y.o. G4P3003 at [redacted]w[redacted]d by Definite LMP of Jul 15, 2021 .  She presents today for Back Pain and Pelvic Pain.  She states her pain has been present "for a couple weeks" and goes on to report it was "the month of October."  She states her pain is worsened with standing or driving and improved with laying down and stretching her legs.  She reports the pain is intermittent and reports it is "just sharp and like pressure in my back... an annoying pressure that moves from the back to the front."   She rates the pain a 7-8/10 when it occurs, but she is currently not experiencing pain.     OB History     Gravida  4   Para  3   Term  3   Preterm  0   AB  0   Living  3      SAB  0   IAB  0   Ectopic  0   Multiple  0   Live Births  3           Past Medical History:  Diagnosis Date   Acne    Anemia    History of chlamydia infection     Past Surgical History:  Procedure Laterality Date   HERNIA REPAIR     umbilical   WISDOM TOOTH EXTRACTION      Family History  Problem Relation Age of Onset   Diabetes Paternal Grandmother    Asthma Sister    Learning disabilities Sister    Kidney disease Sister        dialysis    Social History   Tobacco Use   Smoking status: Former    Types: Cigars   Smokeless tobacco: Never   Tobacco comments:    not smoked since pregnant, passive smoker  Vaping Use   Vaping Use: Never used  Substance Use Topics   Alcohol use: Not Currently   Drug use: Yes    Types: Marijuana    Comment: not since pregnant    Allergies:  Allergies  Allergen Reactions   Amoxicillin Hives   Penicillins Hives    Has patient had a PCN reaction causing immediate rash, facial/tongue/throat  swelling, SOB or lightheadedness with hypotension: Yes Has patient had a PCN reaction causing severe rash involving mucus membranes or skin necrosis: No Has patient had a PCN reaction that required hospitalization Yes Has patient had a PCN reaction occurring within the last 10 years: No If all of the above answers are "NO", then may proceed with Cephalosporin use.    Medications Prior to Admission  Medication Sig Dispense Refill Last Dose   ferrous sulfate 325 (65 FE) MG tablet Take 325 mg by mouth daily with breakfast.      ibuprofen (ADVIL) 600 MG tablet Take 1 tablet (600 mg total) by mouth every 6 (six) hours. 30 tablet 0    naproxen (NAPROSYN) 500 MG tablet Take 1 tablet (500 mg total) by mouth 2 (two) times daily with a meal. 30 tablet 0    tiZANidine (ZANAFLEX) 4 MG tablet Take 1 tablet (4 mg total) by mouth at bedtime. 30 tablet 0  valACYclovir (VALTREX) 500 MG tablet Take 500 mg by mouth daily.       Review of Systems  Gastrointestinal:  Positive for abdominal pain and nausea. Negative for vomiting.  Genitourinary:  Positive for dyspareunia and vaginal discharge (White thick cloudy discharge on Sunday, none currently). Negative for difficulty urinating, dysuria and vaginal bleeding.  Musculoskeletal:  Positive for back pain.  Physical Exam   Blood pressure 115/81, pulse 66, temperature 98.8 F (37.1 C), temperature source Oral, resp. rate 16, height 5' 3.5" (1.613 m), weight 60 kg, last menstrual period 07/15/2021, SpO2 100 %, unknown if currently breastfeeding.  Physical Exam Vitals reviewed.  Constitutional:      Appearance: Normal appearance.  HENT:     Head: Normocephalic and atraumatic.  Eyes:     Conjunctiva/sclera: Conjunctivae normal.  Cardiovascular:     Rate and Rhythm: Normal rate and regular rhythm.     Heart sounds: Normal heart sounds.  Pulmonary:     Effort: Pulmonary effort is normal. No respiratory distress.     Breath sounds: Normal breath sounds.   Abdominal:     General: Bowel sounds are normal.  Musculoskeletal:        General: Normal range of motion.     Cervical back: Normal range of motion.  Skin:    General: Skin is warm and dry.  Neurological:     Mental Status: She is alert and oriented to person, place, and time.  Psychiatric:        Mood and Affect: Mood normal.        Behavior: Behavior normal.        Thought Content: Thought content normal.    MAU Course  Procedures Results for orders placed or performed during the hospital encounter of 08/15/21 (from the past 24 hour(s))  Pregnancy, urine POC     Status: Abnormal   Collection Time: 08/15/21  3:16 PM  Result Value Ref Range   Preg Test, Ur POSITIVE (A) NEGATIVE  Urinalysis, Routine w reflex microscopic Urine, Clean Catch     Status: Abnormal   Collection Time: 08/15/21  3:20 PM  Result Value Ref Range   Color, Urine YELLOW YELLOW   APPearance HAZY (A) CLEAR   Specific Gravity, Urine 1.021 1.005 - 1.030   pH 7.0 5.0 - 8.0   Glucose, UA NEGATIVE NEGATIVE mg/dL   Hgb urine dipstick NEGATIVE NEGATIVE   Bilirubin Urine NEGATIVE NEGATIVE   Ketones, ur 5 (A) NEGATIVE mg/dL   Protein, ur NEGATIVE NEGATIVE mg/dL   Nitrite NEGATIVE NEGATIVE   Leukocytes,Ua NEGATIVE NEGATIVE  Wet prep, genital     Status: Abnormal   Collection Time: 08/15/21  3:25 PM   Specimen: PATH Cytology Cervicovaginal Ancillary Only  Result Value Ref Range   Yeast Wet Prep HPF POC NONE SEEN NONE SEEN   Trich, Wet Prep NONE SEEN NONE SEEN   Clue Cells Wet Prep HPF POC NONE SEEN NONE SEEN   WBC, Wet Prep HPF POC MANY (A) NONE SEEN   Sperm PRESENT   hCG, quantitative, pregnancy     Status: Abnormal   Collection Time: 08/15/21  3:39 PM  Result Value Ref Range   hCG, Beta Chain, Quant, S 1,174 (H) <5 mIU/mL  ABO/Rh     Status: None   Collection Time: 08/15/21  3:39 PM  Result Value Ref Range   ABO/RH(D)      O NEG Performed at [facility], [address], [place], [place]  [address]  US OB LESS THAN 14 WEEKS WITH OB TRANSVAGINAL  Result Date: 08/15/2021 CLINICAL DATA:  Abdominal pain. EXAM: OBSTETRIC <14 WK [place] AND TRANSVAGINAL OB US TECHNIQUE: Both transabdominal and transvaginal ultrasound examinations were performed for complete evaluation of the gestation as well as the maternal uterus, adnexal regions, and pelvic cul-de-sac. Transvaginal technique was performed to assess early pregnancy. COMPARISON:  None. FINDINGS: Intrauterine gestational sac: Questionable single gestational sac identified (elongated in shape) Yolk sac:  Not Visualized. Embryo:  Not Visualized. MSD: 6.1 mm   5 w   2 d Subchorionic hemorrhage:  None visualized. Maternal uterus/adnexae: Bilateral ovaries appear within normal limits. Right ovary measures 3.2 x 2.1 x 2.3 cm. Left ovary measures 2.1 x 1.6 x 1.9 cm. There is a small amount of free fluid in the pelvis. IMPRESSION: 1. Questionable single irregularly-shaped intrauterine gestational sac measuring 5 weeks 2 days by crown-rump length. Findings are suspicious but not yet definitive for failed pregnancy. Recommend follow-up US in 10-14 days for definitive diagnosis. This recommendation follows SRU consensus guidelines: Diagnostic Criteria for Nonviable Pregnancy Early in the First Trimester. Malva Limes Med 2013; 607:3710-62. 2. Normal appearance of ovaries. 3. Small amount of free fluid in the pelvis. Electronically Signed   By: Darliss Cheney M.D.   On: 08/15/2021 17:02     MDM Pelvic Exam; Wet Prep and GC/CT Labs: UA, UPT, CBC, hCG, ABO Ultrasound Assessment and Plan  25 year old G4P3003 at 4.3 weeks Back Pain Pelvic Pain  -Labs and [place] results return. -Provider to bedside to discuss. -Informed and shown pictures of gestational sac that is of concern.  Compared to patient's [place] from 2019 showing normal gestational sac. -Discussed options for follow up including repeat hCG level, ultrasound, or both. -Patient opts for all  interventions. -Informed that hCG level may rise but concern is for abnormal gestational sac which is usually indicative of failed pregnancy. -Patient verbalizes understanding. -Scheduled for return hCG level on Friday Nov 4 -Request for TVUS placed for completion in next 1-2 weeks. -Bleeding precautions given. -Encouraged to return with worsening of symptoms. -Discharged to home in stable condition.  Cherre Robins 08/15/2021, 5:22 PM

## 2021-08-15 NOTE — MAU Note (Signed)
Peggy Mason is a 25 y.o. at Unknown here in MAU reporting: had + UPT on Sunday. Been having intermittent back pain for the past couple of weeks. Is also having intermittent pelvic pain. Also having some vaginal itching and burning.   LMP: 07/15/2021, states shorter and lighter than normal  Onset of complaint: ongoing  Pain score: 5/10  Vitals:   08/15/21 1418  BP: 115/81  Pulse: 66  Resp: 16  Temp: 98.8 F (37.1 C)  SpO2: 100%     Lab orders placed from triage: upt

## 2021-08-15 NOTE — ED Notes (Signed)
Pt name called for vitals, no response 

## 2021-08-16 LAB — GC/CHLAMYDIA PROBE AMP (~~LOC~~) NOT AT ARMC
Chlamydia: NEGATIVE
Comment: NEGATIVE
Comment: NORMAL
Neisseria Gonorrhea: NEGATIVE

## 2021-08-16 LAB — ABO/RH
ABO/RH(D): O NEG
Antibody Screen: NEGATIVE

## 2021-08-17 ENCOUNTER — Other Ambulatory Visit: Payer: Self-pay

## 2021-08-17 ENCOUNTER — Inpatient Hospital Stay (HOSPITAL_COMMUNITY)
Admission: AD | Admit: 2021-08-17 | Discharge: 2021-08-17 | Disposition: A | Discharge status: Home / Self Care | Payer: Medicaid Other | Attending: Obstetrics and Gynecology | Admitting: Obstetrics and Gynecology

## 2021-08-17 DIAGNOSIS — Z3A01 Less than 8 weeks gestation of pregnancy: Secondary | ICD-10-CM | POA: Diagnosis not present

## 2021-08-17 DIAGNOSIS — O3680X0 Pregnancy with inconclusive fetal viability, not applicable or unspecified: Secondary | ICD-10-CM | POA: Insufficient documentation

## 2021-08-17 LAB — HCG, QUANTITATIVE, PREGNANCY: hCG, Beta Chain, Quant, S: 3432 m[IU]/mL — ABNORMAL HIGH (ref <5)

## 2021-08-17 NOTE — MAU Provider Note (Signed)
History   Chief Complaint:  Follow-up   Peggy Mason is  25 y.o. X5T7001 Patient's last menstrual period was 07/15/2021.Marland Kitchen Patient is here for follow up of quantitative HCG and ongoing surveillance of pregnancy status.   She is [redacted]w[redacted]d weeks gestation  by LMP.    Since her last visit, the patient is without new complaint.   Has never had vaginal bleeding.  The abdominal pain she has is resolving and she feels the best today she has since learning she was pregnant.  General ROS:  negative for vaginal bleeding and abdominal pain.  Her previous Quantitative HCG values are:   11-2--22  1174    Physical Exam   Blood pressure 104/61, pulse 81, temperature 99 F (37.2 C), temperature source Oral, resp. rate 16, last menstrual period 07/15/2021, SpO2 100 %, unknown if currently breastfeeding.  GENERAL: Well-developed, well-nourished female in no acute distress.  HEENT: Normocephalic, atraumatic.  LUNGS: Effort normal  HEART: Regular rate  SKIN: Warm, dry and without erythema  PSYCH: Normal mood and affect  Labs: Results for orders placed or performed during the hospital encounter of 08/17/21 (from the past 24 hour(s))  hCG, quantitative, pregnancy   Collection Time: 08/17/21  3:50 PM  Result Value Ref Range   hCG, Beta Chain, Quant, S 3,432 (H) <5 mIU/mL    Ultrasound Studies:   US OB LESS THAN 14 WEEKS WITH OB TRANSVAGINAL  Result Date: 08/15/2021 CLINICAL DATA:  Abdominal pain. EXAM: OBSTETRIC <14 WK [place] AND TRANSVAGINAL OB US TECHNIQUE: Both transabdominal and transvaginal ultrasound examinations were performed for complete evaluation of the gestation as well as the maternal uterus, adnexal regions, and pelvic cul-de-sac. Transvaginal technique was performed to assess early pregnancy. COMPARISON:  None. FINDINGS: Intrauterine gestational sac: Questionable single gestational sac identified (elongated in shape) Yolk sac:  Not Visualized. Embryo:  Not Visualized. MSD: 6.1 mm   5 w   2 d  Subchorionic hemorrhage:  None visualized. Maternal uterus/adnexae: Bilateral ovaries appear within normal limits. Right ovary measures 3.2 x 2.1 x 2.3 cm. Left ovary measures 2.1 x 1.6 x 1.9 cm. There is a small amount of free fluid in the pelvis. IMPRESSION: 1. Questionable single irregularly-shaped intrauterine gestational sac measuring 5 weeks 2 days by crown-rump length. Findings are suspicious but not yet definitive for failed pregnancy. Recommend follow-up US in 10-14 days for definitive diagnosis. This recommendation follows SRU consensus guidelines: Diagnostic Criteria for Nonviable Pregnancy Early in the First Trimester. Malva Limes Med 2013; 749:4496-75. 2. Normal appearance of ovaries. 3. Small amount of free fluid in the pelvis. Electronically Signed   By: Darliss Cheney M.D.   On: 08/15/2021 17:02    Assessment:  [redacted]w[redacted]d weeks gestation   Pregnancy of unknown anatomic location HCG rising appropriately.  Plan: The patient is instructed to keep her appointment for ultrasound as scheduled Is aware of her appointment and the address for the ultrasound. Advised pelvic rest and no strenous exercise.  No heavy lifting. Advised to use Tylenol if needed for pain.  Use by the package directions  Currie Paris 08/17/2021, 5:22 PM

## 2021-08-17 NOTE — Discharge Instructions (Signed)
Keep your appointment for ultrasound as scheduled. Return as needed if symptoms worsen. Pelvic rest - no intercourse, no strenuous exercise, no heavy lifting

## 2021-08-17 NOTE — MAU Note (Signed)
Here for bloodwork.  Feeling better today, not in as much pain.  Reports pressure in lower back, shoots up and down left leg, like a spasm. (Random, infrequent today)Cramped all day yesterday, not too noticeable today.  No bleeding.

## 2021-08-27 ENCOUNTER — Other Ambulatory Visit: Payer: Self-pay

## 2021-08-27 ENCOUNTER — Encounter (HOSPITAL_COMMUNITY): Payer: Self-pay | Admitting: Obstetrics & Gynecology

## 2021-08-27 ENCOUNTER — Inpatient Hospital Stay (HOSPITAL_COMMUNITY): Payer: Medicaid Other

## 2021-08-27 ENCOUNTER — Inpatient Hospital Stay (HOSPITAL_COMMUNITY)
Admission: AD | Admit: 2021-08-27 | Discharge: 2021-08-28 | Disposition: A | Discharge status: Home / Self Care | Payer: Medicaid Other | Attending: Obstetrics & Gynecology | Admitting: Obstetrics & Gynecology

## 2021-08-27 DIAGNOSIS — Z3A01 Less than 8 weeks gestation of pregnancy: Secondary | ICD-10-CM | POA: Insufficient documentation

## 2021-08-27 DIAGNOSIS — R1032 Left lower quadrant pain: Secondary | ICD-10-CM | POA: Diagnosis not present

## 2021-08-27 DIAGNOSIS — O26899 Other specified pregnancy related conditions, unspecified trimester: Secondary | ICD-10-CM

## 2021-08-27 DIAGNOSIS — O99891 Other specified diseases and conditions complicating pregnancy: Secondary | ICD-10-CM

## 2021-08-27 DIAGNOSIS — M549 Dorsalgia, unspecified: Secondary | ICD-10-CM | POA: Diagnosis not present

## 2021-08-27 DIAGNOSIS — E860 Dehydration: Secondary | ICD-10-CM | POA: Insufficient documentation

## 2021-08-27 DIAGNOSIS — O99280 Endocrine, nutritional and metabolic diseases complicating pregnancy, unspecified trimester: Secondary | ICD-10-CM

## 2021-08-27 DIAGNOSIS — O219 Vomiting of pregnancy, unspecified: Secondary | ICD-10-CM | POA: Insufficient documentation

## 2021-08-27 DIAGNOSIS — O418X10 Other specified disorders of amniotic fluid and membranes, first trimester, not applicable or unspecified: Secondary | ICD-10-CM | POA: Diagnosis not present

## 2021-08-27 DIAGNOSIS — O99281 Endocrine, nutritional and metabolic diseases complicating pregnancy, first trimester: Secondary | ICD-10-CM | POA: Diagnosis not present

## 2021-08-27 DIAGNOSIS — O26891 Other specified pregnancy related conditions, first trimester: Secondary | ICD-10-CM | POA: Insufficient documentation

## 2021-08-27 DIAGNOSIS — Z87891 Personal history of nicotine dependence: Secondary | ICD-10-CM | POA: Diagnosis not present

## 2021-08-27 DIAGNOSIS — O208 Other hemorrhage in early pregnancy: Secondary | ICD-10-CM | POA: Insufficient documentation

## 2021-08-27 HISTORY — DX: Herpesviral infection, unspecified: B00.9

## 2021-08-27 LAB — CBC
HCT: 37.3 % (ref 36.0–46.0)
Hemoglobin: 12.6 g/dL (ref 12.0–15.0)
MCH: 29.1 pg (ref 26.0–34.0)
MCHC: 33.8 g/dL (ref 30.0–36.0)
MCV: 86.1 fL (ref 80.0–100.0)
Platelets: 402 10*3/uL — ABNORMAL HIGH (ref 150–400)
RBC: 4.33 MIL/uL (ref 3.87–5.11)
RDW: 13.3 % (ref 11.5–15.5)
WBC: 9.4 10*3/uL (ref 4.0–10.5)
nRBC: 0 % (ref 0.0–0.2)

## 2021-08-27 LAB — COMPREHENSIVE METABOLIC PANEL
ALT: 17 U/L (ref 0–44)
AST: 14 U/L — ABNORMAL LOW (ref 15–41)
Albumin: 3.7 g/dL (ref 3.5–5.0)
Alkaline Phosphatase: 60 U/L (ref 38–126)
Anion gap: 7 (ref 5–15)
BUN: 8 mg/dL (ref 6–20)
CO2: 23 mmol/L (ref 22–32)
Calcium: 9.3 mg/dL (ref 8.9–10.3)
Chloride: 104 mmol/L (ref 98–111)
Creatinine, Ser: 0.59 mg/dL (ref 0.44–1.00)
GFR, Estimated: >60 mL/min (ref >60)
Glucose, Bld: 88 mg/dL (ref 70–99)
Potassium: 3.6 mmol/L (ref 3.5–5.1)
Sodium: 134 mmol/L — ABNORMAL LOW (ref 135–145)
Total Bilirubin: 0.7 mg/dL (ref 0.3–1.2)
Total Protein: 7.1 g/dL (ref 6.5–8.1)

## 2021-08-27 LAB — URINALYSIS, ROUTINE W REFLEX MICROSCOPIC
Bacteria, UA: NONE SEEN
Bilirubin Urine: NEGATIVE
Glucose, UA: NEGATIVE mg/dL
Ketones, ur: 20 mg/dL — AB
Leukocytes,Ua: NEGATIVE
Nitrite: NEGATIVE
Protein, ur: 100 mg/dL — AB
Specific Gravity, Urine: 1.03 (ref 1.005–1.030)
pH: 5 (ref 5.0–8.0)

## 2021-08-27 LAB — LIPASE, BLOOD: Lipase: 24 U/L (ref 11–51)

## 2021-08-27 MED ORDER — ONDANSETRON HCL 4 MG/2ML IJ SOLN
4.0000 mg | Freq: Once | INTRAMUSCULAR | Status: AC
  Administered 2021-08-27: 4 mg via INTRAVENOUS
  Filled 2021-08-27: qty 2

## 2021-08-27 MED ORDER — LACTATED RINGERS IV BOLUS
1000.0000 mL | Freq: Once | INTRAVENOUS | Status: AC
  Administered 2021-08-27: 1000 mL via INTRAVENOUS

## 2021-08-27 MED ORDER — FAMOTIDINE IN NACL 20-0.9 MG/50ML-% IV SOLN
20.0000 mg | Freq: Once | INTRAVENOUS | Status: AC
  Administered 2021-08-27: 20 mg via INTRAVENOUS
  Filled 2021-08-27: qty 50

## 2021-08-27 NOTE — MAU Note (Signed)
Having pelvic pain on LL abdomen. Sharp pains that catches me sometimes when breathing. Pain in lower back that shoots to R shoulder. Sometimes pain shoots down R leg. Unable to eat or drink. I am hungry but can't eat and even smells make me nauseated. Has appt this Weds for first appt at 3[address]

## 2021-08-28 DIAGNOSIS — O99280 Endocrine, nutritional and metabolic diseases complicating pregnancy, unspecified trimester: Secondary | ICD-10-CM

## 2021-08-28 DIAGNOSIS — O26899 Other specified pregnancy related conditions, unspecified trimester: Secondary | ICD-10-CM

## 2021-08-28 DIAGNOSIS — O219 Vomiting of pregnancy, unspecified: Secondary | ICD-10-CM

## 2021-08-28 LAB — HCG, QUANTITATIVE, PREGNANCY: hCG, Beta Chain, Quant, S: 38601 m[IU]/mL — ABNORMAL HIGH (ref <5)

## 2021-08-28 MED ORDER — PROCHLORPERAZINE MALEATE 10 MG PO TABS
10.0000 mg | ORAL_TABLET | Freq: Four times a day (QID) | ORAL | 0 refills | Status: DC | PRN
Start: 2021-08-28 — End: 2021-09-01

## 2021-08-28 MED ORDER — FAMOTIDINE 40 MG PO TABS: 40.0000 mg | ORAL_TABLET | Freq: Every day | ORAL | 2 refills | Status: DC

## 2021-08-28 NOTE — Progress Notes (Signed)
Written and verbal d/c instructions given and understanding voiced. 

## 2021-08-28 NOTE — MAU Provider Note (Signed)
Chief Complaint:  Nausea, Emesis During Pregnancy, Back Pain, and Abdominal Pain   Event Date/Time   First Provider Initiated Contact with Patient 08/27/21 2251     HPI: Peggy Mason is a 25 y.o. G4P3003 at 10258w2d who presents to maternity admissions reporting lower left sided abdominal pain and pressure that radiates down her leg, all over back pain/aching, and worsening nausea/vomiting. She has been unable to keep down food or fluids for more than 30min for the past two days and noted the generalized back pain earlier this evening. Denies vaginal bleeding and vaginal bleeding and notes the abdominal pain is constant, not in waves.  Pregnancy Course: Has been to MAU for pregnancy of unknown location, has an [facility] scheduled for Wednesday [facility] and then plans to return to [facility] for prenatal care.  Prenatal records reviewed.  Past Medical History:  Diagnosis Date   Acne    Anemia    Herpes    History of chlamydia infection    OB History  Gravida Para Term Preterm AB Living  4 3 3  0 0 3  SAB IAB Ectopic Multiple Live Births  0 0 0 0 3    # Outcome Date GA Lbr Len/2nd Weight Sex Delivery Anes PTL Lv  4 Current           3 Term 03/12/19 4895w2d 19:10 / 00:47 8 lb 11.7 oz (3.96 kg) M Vag-Spont None  LIV  2 Term 03/30/16 1930w0d 10:30 / 00:03 6 lb 10 oz (3.005 kg) M Vag-Spont EPI  LIV  1 Term 09/02/12 6666w1d 14:03 / 01:34 7 lb 9.7 oz (3.45 kg) F Vag-Spont EPI  LIV   Past Surgical History:  Procedure Laterality Date   HERNIA REPAIR     umbilical   WISDOM TOOTH EXTRACTION     Family History  Problem Relation Age of Onset   Diabetes Paternal Grandmother    Asthma Sister    Learning disabilities Sister    Kidney disease Sister        dialysis   Social History   Tobacco Use   Smoking status: Former    Types: Cigars   Smokeless tobacco: Never   Tobacco comments:    not smoked since pregnant, passive smoker  Vaping Use   Vaping Use: Former  Substance Use Topics   Alcohol  use: Not Currently   Drug use: Yes    Types: Marijuana    Comment: not since pregnant   Allergies  Allergen Reactions   Amoxicillin Hives   Penicillins Hives    Has patient had a PCN reaction causing immediate rash, facial/tongue/throat swelling, SOB or lightheadedness with hypotension: Yes Has patient had a PCN reaction causing severe rash involving mucus membranes or skin necrosis: No Has patient had a PCN reaction that required hospitalization Yes Has patient had a PCN reaction occurring within the last 10 years: No If all of the above answers are "NO", then may proceed with Cephalosporin use.   No medications prior to admission.   I have reviewed patient's Past Medical Hx, Surgical Hx, Family Hx, Social Hx, medications and allergies.   ROS:  Review of Systems  Constitutional:  Positive for fatigue. Negative for fever.  Eyes:  Negative for photophobia and visual disturbance.  Respiratory:  Negative for cough.   Cardiovascular:  Negative for chest pain.  Gastrointestinal:  Positive for abdominal pain, nausea and vomiting. Negative for abdominal distention and constipation (last BM this morning, normal for her).  Genitourinary:  Negative  for flank pain and pelvic pain.  Neurological:  Positive for headaches (mild). Negative for dizziness, syncope and light-headedness.   Physical Exam  Patient Vitals for the past 24 hrs:  BP Temp Pulse Resp SpO2 Height Weight  08/28/21 0114 (!) 106/54 -- -- -- -- -- --  08/27/21 1936 105/61 -- -- -- -- -- --  08/27/21 1935 -- 98.3 F (36.8 C) 72 16 100 % 5\' 4"  (1.626 m) 129 lb (58.5 kg)   Constitutional: Well-developed, well-nourished female laying in fetal position to relieve the pain in her back and lower abdomen. Appears weak.  Cardiovascular: normal rate & rhythm Respiratory: normal effort GI: Abd soft, non-tender, pos BS x 4 MS: Extremities nontender, no edema, normal ROM Neurologic: Alert and oriented x 4.  GU: no CVA  tenderness Pelvic: exam deferred, sent to U/S  Labs: Results for orders placed or performed during the hospital encounter of 08/27/21 (from the past 24 hour(s))  Urinalysis, Routine w reflex microscopic Urine, Clean Catch     Status: Abnormal   Collection Time: 08/27/21  7:46 PM  Result Value Ref Range   Color, Urine YELLOW YELLOW   APPearance HAZY (A) CLEAR   Specific Gravity, Urine 1.030 1.005 - 1.030   pH 5.0 5.0 - 8.0   Glucose, UA NEGATIVE NEGATIVE mg/dL   Hgb urine dipstick SMALL (A) NEGATIVE   Bilirubin Urine NEGATIVE NEGATIVE   Ketones, ur 20 (A) NEGATIVE mg/dL   Protein, ur 100 (A) NEGATIVE mg/dL   Nitrite NEGATIVE NEGATIVE   Leukocytes,Ua NEGATIVE NEGATIVE   RBC / HPF 11-20 0 - 5 RBC/hpf   WBC, UA 0-5 0 - 5 WBC/hpf   Bacteria, UA NONE SEEN NONE SEEN   Squamous Epithelial / LPF 6-10 0 - 5   Mucus PRESENT   hCG, quantitative, pregnancy     Status: Abnormal   Collection Time: 08/27/21 11:08 PM  Result Value Ref Range   hCG, Beta Chain, Quant, S 38,601 (H) <5 mIU/mL  CBC     Status: Abnormal   Collection Time: 08/27/21 11:08 PM  Result Value Ref Range   WBC 9.4 4.0 - 10.5 K/uL   RBC 4.33 3.87 - 5.11 MIL/uL   Hemoglobin 12.6 12.0 - 15.0 g/dL   HCT 37.3 36.0 - 46.0 %   MCV 86.1 80.0 - 100.0 fL   MCH 29.1 26.0 - 34.0 pg   MCHC 33.8 30.0 - 36.0 g/dL   RDW 13.3 11.5 - 15.5 %   Platelets 402 (H) 150 - 400 K/uL   nRBC 0.0 0.0 - 0.2 %  Comprehensive metabolic panel     Status: Abnormal   Collection Time: 08/27/21 11:08 PM  Result Value Ref Range   Sodium 134 (L) 135 - 145 mmol/L   Potassium 3.6 3.5 - 5.1 mmol/L   Chloride 104 98 - 111 mmol/L   CO2 23 22 - 32 mmol/L   Glucose, Bld 88 70 - 99 mg/dL   BUN 8 6 - 20 mg/dL   Creatinine, Ser 0.59 0.44 - 1.00 mg/dL   Calcium 9.3 8.9 - 10.3 mg/dL   Total Protein 7.1 6.5 - 8.1 g/dL   Albumin 3.7 3.5 - 5.0 g/dL   AST 14 (L) 15 - 41 U/L   ALT 17 0 - 44 U/L   Alkaline Phosphatase 60 38 - 126 U/L   Total Bilirubin 0.7 0.3 -  1.2 mg/dL   GFR, Estimated >60 >60 mL/min   Anion gap 7 5 - 15  Lipase, blood     Status: None   Collection Time: 08/27/21 11:08 PM  Result Value Ref Range   Lipase 24 11 - 51 U/L   Imaging:  US OB Transvaginal  Result Date: 08/27/2021 CLINICAL DATA:  Pain EXAM: TRANSVAGINAL OB ULTRASOUND TECHNIQUE: Transvaginal ultrasound was performed for complete evaluation of the gestation as well as the maternal uterus, adnexal regions, and pelvic cul-de-sac. COMPARISON:  None. FINDINGS: Intrauterine gestational sac: Single Yolk sac:  Visualized. Embryo:  Visualized. Cardiac Activity: Visualized. Heart Rate: 108 bpm MSD:   mm    w     d CRL:   7.8 mm   5 w 5 d                  [place] EDC: 04/24/2022 Subchorionic hemorrhage:  Moderate subchorionic hemorrhage Maternal uterus/adnexae: No adnexal mass or free fluid IMPRESSION: 5 week 5 day intrauterine pregnancy. Fetal heart rate 108 beats per minute. Moderate-sized subchorionic hemorrhage. Electronically Signed   By: Rolm Baptise M.D.   On: 08/27/2021 23:37    MAU Course: Orders Placed This Encounter  Procedures   US OB Transvaginal   Urinalysis, Routine w reflex microscopic Urine, Clean Catch   hCG, quantitative, pregnancy   CBC   Comprehensive metabolic panel   Lipase, blood   Discharge patient   Meds ordered this encounter  Medications   lactated ringers bolus 1,000 mL   ondansetron (ZOFRAN) injection 4 mg   famotidine (PEPCID) IVPB 20 mg premix   famotidine (PEPCID) 40 MG tablet    Sig: Take 1 tablet (40 mg total) by mouth at bedtime.    Dispense:  30 tablet    Refill:  2    Order Specific Question:   Supervising Provider    Answer:   Donnamae Jude [[id number]]   prochlorperazine (COMPAZINE) 10 MG tablet    Sig: Take 1 tablet (10 mg total) by mouth every 6 (six) hours as needed for nausea or vomiting.    Dispense:  30 tablet    Refill:  0    Order Specific Question:   Supervising Provider    Answer:   Merrily Pew   MDM:  UA showed  dehydration so LR bolus with zofran IV and pepcid IVPB started which gave good relief of nausea and pt had no episodes of vomiting. Overall pain also improved with rehydration.  Discussed management of n/v and pt noted that zofran ODT has not worked for her in the past but "a med that starts with a c" has worked better.  Reviewed results of U/S and gave [facility] and bleeding precautions. Discussed increased risk of miscarriage with larger hematomas and "normalcy" of cramping with a [place]. Pt expressed understanding. U/S confirmed IUP so [facility] appt canceled, pt can begin care at [facility].  Assessment: 1. Nausea and vomiting during pregnancy   2. Abdominal pain affecting pregnancy   3. Back pain affecting pregnancy in first trimester   4. Dehydration during pregnancy   5. Subchorionic hematoma in first trimester, single or unspecified fetus    Plan: Discharge home in stable condition with return precautions.     Follow-up Information     Ob/Gyn, [place]. Schedule an appointment as soon as possible for a visit.   Specialty: Obstetrics and Gynecology Contact information: [address] [place] [address] [phone]                 Allergies as of 08/28/2021  Reactions   Amoxicillin Hives   Penicillins Hives   Has patient had a PCN reaction causing immediate rash, facial/tongue/throat swelling, SOB or lightheadedness with hypotension: Yes Has patient had a PCN reaction causing severe rash involving mucus membranes or skin necrosis: No Has patient had a PCN reaction that required hospitalization Yes Has patient had a PCN reaction occurring within the last 10 years: No If all of the above answers are "NO", then may proceed with Cephalosporin use.        Medication List     TAKE these medications    famotidine 40 MG tablet Commonly known as: Pepcid Take 1 tablet (40 mg total) by mouth at bedtime.   prochlorperazine 10 MG tablet Commonly known as:  COMPAZINE Take 1 tablet (10 mg total) by mouth every 6 (six) hours as needed for nausea or vomiting.   valACYclovir 500 MG tablet Commonly known as: VALTREX Take 500 mg by mouth daily.       ASK your doctor about these medications    ferrous sulfate 325 (65 FE) MG tablet Take 325 mg by mouth daily with breakfast.       Edd Arbour, CNM, MSN, IBCLC Certified Nurse Midwife, [facility]

## 2021-08-29 ENCOUNTER — Inpatient Hospital Stay: Admission: RE | Admit: 2021-08-29 | Payer: Medicaid Other | Source: Ambulatory Visit

## 2021-08-31 ENCOUNTER — Inpatient Hospital Stay (HOSPITAL_COMMUNITY)
Admission: AD | Admit: 2021-08-31 | Discharge: 2021-09-01 | Disposition: A | Discharge status: Home / Self Care | Payer: Medicaid Other | Attending: Obstetrics & Gynecology | Admitting: Obstetrics & Gynecology

## 2021-08-31 ENCOUNTER — Other Ambulatory Visit: Payer: Self-pay

## 2021-08-31 DIAGNOSIS — Z3A01 Less than 8 weeks gestation of pregnancy: Secondary | ICD-10-CM | POA: Insufficient documentation

## 2021-08-31 DIAGNOSIS — O99891 Other specified diseases and conditions complicating pregnancy: Secondary | ICD-10-CM

## 2021-08-31 DIAGNOSIS — R102 Pelvic and perineal pain: Secondary | ICD-10-CM | POA: Insufficient documentation

## 2021-08-31 DIAGNOSIS — O219 Vomiting of pregnancy, unspecified: Secondary | ICD-10-CM | POA: Insufficient documentation

## 2021-08-31 DIAGNOSIS — O26891 Other specified pregnancy related conditions, first trimester: Secondary | ICD-10-CM | POA: Insufficient documentation

## 2021-08-31 DIAGNOSIS — M549 Dorsalgia, unspecified: Secondary | ICD-10-CM | POA: Insufficient documentation

## 2021-09-01 ENCOUNTER — Encounter (HOSPITAL_COMMUNITY): Payer: Self-pay | Admitting: Obstetrics & Gynecology

## 2021-09-01 DIAGNOSIS — O99891 Other specified diseases and conditions complicating pregnancy: Secondary | ICD-10-CM | POA: Diagnosis not present

## 2021-09-01 DIAGNOSIS — M549 Dorsalgia, unspecified: Secondary | ICD-10-CM | POA: Diagnosis not present

## 2021-09-01 DIAGNOSIS — Z3A01 Less than 8 weeks gestation of pregnancy: Secondary | ICD-10-CM

## 2021-09-01 DIAGNOSIS — O219 Vomiting of pregnancy, unspecified: Secondary | ICD-10-CM | POA: Diagnosis not present

## 2021-09-01 DIAGNOSIS — R102 Pelvic and perineal pain: Secondary | ICD-10-CM | POA: Diagnosis not present

## 2021-09-01 DIAGNOSIS — O26891 Other specified pregnancy related conditions, first trimester: Secondary | ICD-10-CM | POA: Diagnosis not present

## 2021-09-01 LAB — URINALYSIS, ROUTINE W REFLEX MICROSCOPIC
Bilirubin Urine: NEGATIVE
Glucose, UA: NEGATIVE mg/dL
Ketones, ur: 20 mg/dL — AB
Leukocytes,Ua: NEGATIVE
Nitrite: NEGATIVE
Protein, ur: NEGATIVE mg/dL
Specific Gravity, Urine: 1.02 (ref 1.005–1.030)
pH: 6 (ref 5.0–8.0)

## 2021-09-01 MED ORDER — DOXYLAMINE-PYRIDOXINE 10-10 MG PO TBEC: 2.0000 | DELAYED_RELEASE_TABLET | Freq: Every day | ORAL | 3 refills | Status: DC

## 2021-09-01 MED ORDER — CYCLOBENZAPRINE HCL 5 MG PO TABS
10.0000 mg | ORAL_TABLET | Freq: Once | ORAL | Status: AC
  Administered 2021-09-01: 10 mg via ORAL
  Filled 2021-09-01: qty 2

## 2021-09-01 MED ORDER — METOCLOPRAMIDE HCL 10 MG PO TABS: 10.0000 mg | ORAL_TABLET | Freq: Four times a day (QID) | ORAL | 0 refills | Status: DC

## 2021-09-01 MED ORDER — CYCLOBENZAPRINE HCL 5 MG PO TABS: 5.0000 mg | ORAL_TABLET | Freq: Two times a day (BID) | ORAL | 0 refills | Status: DC | PRN

## 2021-09-01 MED ORDER — METOCLOPRAMIDE HCL 5 MG/ML IJ SOLN
10.0000 mg | Freq: Once | INTRAMUSCULAR | Status: AC
  Administered 2021-09-01: 10 mg via INTRAVENOUS
  Filled 2021-09-01: qty 2

## 2021-09-01 MED ORDER — LACTATED RINGERS IV BOLUS
1000.0000 mL | Freq: Once | INTRAVENOUS | Status: AC
  Administered 2021-09-01: 1000 mL via INTRAVENOUS

## 2021-09-01 NOTE — MAU Provider Note (Signed)
History     CSN: [id number]  Arrival date and time: 08/31/21 2349   Event Date/Time   First Provider Initiated Contact with Patient 09/01/21 0120      Chief Complaint  Patient presents with   Nausea   Emesis   Back Pain   Pelvic Pain   Peggy Mason is a 25 y.o. [id number] at [redacted]w[redacted]d by Definite LMP of Jul 15, 2021 who has not established prenatal care.  She presents today for Nausea, Emesis, Back Pain, and Pelvic Pain.  She states she experiencing N/V since Thursday and has been trying to manage it with Compazine and Pepcid.  However, she states "this morning is when it really took off bad."  She states she tried to take the medication with no relief because she couldn't keep it down.  She states that since that time she has not been able to keep anything down. She reports last vomiting incident was "between 6 and 9."  She states the back and pelvic pain has worsened this morning with onset of N/V.  She describes the pain as "cramping in my pelvis" that is worsened with walking and stretching.  She states the back pain is "like constant pressure on my back."  She states the back pain is improved with "curling up in a ball" and worsened with moving around and driving. Patient reports eating:  Potatoes [place] [employer] Ice[employer] Water   OB History     Gravida  4   Para  3   Term  3   Preterm  0   AB  0   Living  3      SAB  0   IAB  0   Ectopic  0   Multiple  0   Live Births  3           Past Medical History:  Diagnosis Date   Acne    Anemia    Herpes    History of chlamydia infection     Past Surgical History:  Procedure Laterality Date   HERNIA REPAIR     umbilical   WISDOM TOOTH EXTRACTION      Family History  Problem Relation Age of Onset   Diabetes Paternal Grandmother    Asthma Sister    Learning disabilities Sister    Kidney disease Sister        dialysis    Social History   Tobacco Use   Smoking status: Former    Types: Cigars    Smokeless tobacco: Never   Tobacco comments:    not smoked since pregnant, passive smoker  Vaping Use   Vaping Use: Former  Substance Use Topics   Alcohol use: Not Currently   Drug use: Yes    Types: Marijuana    Comment: not since pregnant    Allergies:  Allergies  Allergen Reactions   Amoxicillin Hives   Penicillins Hives    Has patient had a PCN reaction causing immediate rash, facial/tongue/throat swelling, SOB or lightheadedness with hypotension: Yes Has patient had a PCN reaction causing severe rash involving mucus membranes or skin necrosis: No Has patient had a PCN reaction that required hospitalization Yes Has patient had a PCN reaction occurring within the last 10 years: No If all of the above answers are "NO", then may proceed with Cephalosporin use.    Medications Prior to Admission  Medication Sig Dispense Refill Last Dose   famotidine (PEPCID) 40 MG tablet Take 1 tablet (40 mg total) by  mouth at bedtime. 30 tablet 2 09/01/2021   prochlorperazine (COMPAZINE) 10 MG tablet Take 1 tablet (10 mg total) by mouth every 6 (six) hours as needed for nausea or vomiting. 30 tablet 0 09/01/2021   ferrous sulfate 325 (65 FE) MG tablet Take 325 mg by mouth daily with breakfast.      valACYclovir (VALTREX) 500 MG tablet Take 500 mg by mouth daily.   More than a month    Review of Systems  Constitutional:  Positive for chills. Negative for fever.  Respiratory:  Negative for cough and shortness of breath.   Gastrointestinal:  Positive for nausea and vomiting. Negative for abdominal pain.  Genitourinary:  Positive for pelvic pain. Negative for difficulty urinating, dysuria, vaginal bleeding and vaginal discharge.  Physical Exam   Blood pressure (!) 107/59, pulse 63, temperature 98.6 F (37 C), temperature source Oral, resp. rate 17, height 5\' 4"  (1.626 m), weight 59.4 kg, last menstrual period 07/15/2021, SpO2 100 %, unknown if currently breastfeeding.  Physical  Exam Constitutional:      General: She is not in acute distress.    Appearance: Normal appearance.  HENT:     Head: Normocephalic and atraumatic.  Eyes:     Conjunctiva/sclera: Conjunctivae normal.  Cardiovascular:     Rate and Rhythm: Normal rate and regular rhythm.     Heart sounds: Normal heart sounds.  Pulmonary:     Effort: Pulmonary effort is normal. No respiratory distress.     Breath sounds: Normal breath sounds.  Abdominal:     General: Bowel sounds are normal.     Tenderness: There is abdominal tenderness in the suprapubic area, left upper quadrant and left lower quadrant.  Musculoskeletal:     Cervical back: Normal range of motion.  Skin:    General: Skin is warm and dry.  Neurological:     Mental Status: She is alert and oriented to person, place, and time.  Psychiatric:        Mood and Affect: Mood normal.        Behavior: Behavior normal.        Thought Content: Thought content normal.    MAU Course  Procedures Results for orders placed or performed during the hospital encounter of 08/31/21 (from the past 24 hour(s))  Urinalysis, Routine w reflex microscopic Urine, Clean Catch     Status: Abnormal   Collection Time: 09/01/21 12:40 AM  Result Value Ref Range   Color, Urine YELLOW YELLOW   APPearance HAZY (A) CLEAR   Specific Gravity, Urine 1.020 1.005 - 1.030   pH 6.0 5.0 - 8.0   Glucose, UA NEGATIVE NEGATIVE mg/dL   Hgb urine dipstick SMALL (A) NEGATIVE   Bilirubin Urine NEGATIVE NEGATIVE   Ketones, ur 20 (A) NEGATIVE mg/dL   Protein, ur NEGATIVE NEGATIVE mg/dL   Nitrite NEGATIVE NEGATIVE   Leukocytes,Ua NEGATIVE NEGATIVE   RBC / HPF 0-5 0 - 5 RBC/hpf   WBC, UA 0-5 0 - 5 WBC/hpf   Bacteria, UA RARE (A) NONE SEEN   Squamous Epithelial / LPF 0-5 0 - 5   Mucus PRESENT     MDM Start IV LR Bolus  Antiemetic Muscle Relaxant Assessment and Plan  25 year old, 32  SIUP at 6.6 weeks N/V Back Pain Pelvic Pain  -Reviewed POC with patient. -Exam  performed and findings discussed.  -Will start iv and given fluids -Discussed usage of Reglan for nausea. Patient agreeable.  -Will also give heating pad for back f/b Flexeril  once nausea has improved. -Will monitor and reassess   Cherre Robins 09/01/2021, 1:20 AM   Reassessment (3:21 AM)  -Patient s/p flexeril dosing. -Reports that she "feels way better." -Discussed discharge to include rx for Reglan and Diclegis. -Reviewed usage of Diclegis including dosing and timing of administration. -Instructed to discontinue Compazine.  -Will also send limited script for flexeril at half dose given. -Patient verbalizes understanding and no questions. -Encouraged to call primary office or return to MAU if symptoms worsen or with the onset of new symptoms. -Discharged to home in stable condition.  Cherre Robins MSN, CNM Advanced Practice Provider, [facility]

## 2021-09-01 NOTE — MAU Note (Signed)
..  BIRGITTA UHLIR is a 25 y.o. at [redacted]w[redacted]d here in MAU reporting: Back and pelvic pain that has been coming and going for the past couple of days. Has not taken anything for the pain. Reports N/V all day, her medications have not worked. Has vomited more than 5 times in the last 24 hours.   Pain score: 8/10 back pain; 10/10 pelvic pain.  Vitals:   09/01/21 0027  BP: (!) 107/59  Pulse: 63  Resp: 17  Temp: 98.6 F (37 C)  SpO2: 100%     Lab orders placed from triage: UA

## 2021-09-09 ENCOUNTER — Encounter (HOSPITAL_COMMUNITY): Payer: Self-pay | Admitting: Obstetrics and Gynecology

## 2021-09-09 ENCOUNTER — Inpatient Hospital Stay (HOSPITAL_COMMUNITY)
Admission: AD | Admit: 2021-09-09 | Discharge: 2021-09-09 | Disposition: A | Discharge status: Home / Self Care | Payer: Medicaid Other | Attending: Obstetrics and Gynecology | Admitting: Obstetrics and Gynecology

## 2021-09-09 DIAGNOSIS — Z20822 Contact with and (suspected) exposure to covid-19: Secondary | ICD-10-CM | POA: Insufficient documentation

## 2021-09-09 DIAGNOSIS — R059 Cough, unspecified: Secondary | ICD-10-CM | POA: Diagnosis not present

## 2021-09-09 DIAGNOSIS — Z3A08 8 weeks gestation of pregnancy: Secondary | ICD-10-CM | POA: Diagnosis not present

## 2021-09-09 DIAGNOSIS — Z87891 Personal history of nicotine dependence: Secondary | ICD-10-CM | POA: Insufficient documentation

## 2021-09-09 DIAGNOSIS — R109 Unspecified abdominal pain: Secondary | ICD-10-CM | POA: Insufficient documentation

## 2021-09-09 DIAGNOSIS — O2341 Unspecified infection of urinary tract in pregnancy, first trimester: Secondary | ICD-10-CM | POA: Insufficient documentation

## 2021-09-09 DIAGNOSIS — O26891 Other specified pregnancy related conditions, first trimester: Secondary | ICD-10-CM | POA: Diagnosis present

## 2021-09-09 DIAGNOSIS — R102 Pelvic and perineal pain: Secondary | ICD-10-CM | POA: Insufficient documentation

## 2021-09-09 LAB — COMPREHENSIVE METABOLIC PANEL
ALT: 40 U/L (ref 0–44)
AST: 22 U/L (ref 15–41)
Albumin: 3.4 g/dL — ABNORMAL LOW (ref 3.5–5.0)
Alkaline Phosphatase: 62 U/L (ref 38–126)
Anion gap: 9 (ref 5–15)
BUN: 5 mg/dL — ABNORMAL LOW (ref 6–20)
CO2: 21 mmol/L — ABNORMAL LOW (ref 22–32)
Calcium: 9.2 mg/dL (ref 8.9–10.3)
Chloride: 102 mmol/L (ref 98–111)
Creatinine, Ser: 0.55 mg/dL (ref 0.44–1.00)
GFR, Estimated: >60 mL/min (ref >60)
Glucose, Bld: 92 mg/dL (ref 70–99)
Potassium: 3.6 mmol/L (ref 3.5–5.1)
Sodium: 132 mmol/L — ABNORMAL LOW (ref 135–145)
Total Bilirubin: 0.6 mg/dL (ref 0.3–1.2)
Total Protein: 6.9 g/dL (ref 6.5–8.1)

## 2021-09-09 LAB — URINALYSIS, ROUTINE W REFLEX MICROSCOPIC
Bilirubin Urine: NEGATIVE
Glucose, UA: NEGATIVE mg/dL
Ketones, ur: 80 mg/dL — AB
Leukocytes,Ua: NEGATIVE
Nitrite: POSITIVE — AB
Protein, ur: 30 mg/dL — AB
Specific Gravity, Urine: 1.024 (ref 1.005–1.030)
pH: 6 (ref 5.0–8.0)

## 2021-09-09 LAB — WET PREP, GENITAL
Sperm: NONE SEEN
Trich, Wet Prep: NONE SEEN
WBC, Wet Prep HPF POC: >=10 — AB (ref <10)
Yeast Wet Prep HPF POC: NONE SEEN

## 2021-09-09 LAB — CBC WITH DIFFERENTIAL/PLATELET
Abs Immature Granulocytes: 0.01 10*3/uL (ref 0.00–0.07)
Basophils Absolute: 0 10*3/uL (ref 0.0–0.1)
Basophils Relative: 1 %
Eosinophils Absolute: 0 10*3/uL (ref 0.0–0.5)
Eosinophils Relative: 0 %
HCT: 34.9 % — ABNORMAL LOW (ref 36.0–46.0)
Hemoglobin: 12 g/dL (ref 12.0–15.0)
Immature Granulocytes: 0 %
Lymphocytes Relative: 13 %
Lymphs Abs: 0.6 10*3/uL — ABNORMAL LOW (ref 0.7–4.0)
MCH: 29.2 pg (ref 26.0–34.0)
MCHC: 34.4 g/dL (ref 30.0–36.0)
MCV: 84.9 fL (ref 80.0–100.0)
Monocytes Absolute: 1.1 10*3/uL — ABNORMAL HIGH (ref 0.1–1.0)
Monocytes Relative: 25 %
Neutro Abs: 2.6 10*3/uL (ref 1.7–7.7)
Neutrophils Relative %: 61 %
Platelets: 327 10*3/uL (ref 150–400)
RBC: 4.11 MIL/uL (ref 3.87–5.11)
RDW: 13.2 % (ref 11.5–15.5)
WBC: 4.2 10*3/uL (ref 4.0–10.5)
nRBC: 0 % (ref 0.0–0.2)

## 2021-09-09 LAB — RESP PANEL BY RT-PCR (FLU A&B, COVID) ARPGX2
Influenza A by PCR: NEGATIVE
Influenza B by PCR: NEGATIVE
SARS Coronavirus 2 by RT PCR: NEGATIVE

## 2021-09-09 MED ORDER — CYCLOBENZAPRINE HCL 5 MG PO TABS
10.0000 mg | ORAL_TABLET | Freq: Once | ORAL | Status: AC
  Administered 2021-09-09: 10:00:00 10 mg via ORAL
  Filled 2021-09-09: qty 2

## 2021-09-09 MED ORDER — DEXTROSE 5 % IN LACTATED RINGERS IV BOLUS
1000.0000 mL | Freq: Once | INTRAVENOUS | Status: AC
  Administered 2021-09-09: 11:00:00 1000 mL via INTRAVENOUS

## 2021-09-09 MED ORDER — NITROFURANTOIN MONOHYD MACRO 100 MG PO CAPS
100.0000 mg | ORAL_CAPSULE | Freq: Two times a day (BID) | ORAL | 0 refills | Status: DC
Start: 2021-09-09 — End: 2022-01-01

## 2021-09-09 MED ORDER — ACETAMINOPHEN 500 MG PO TABS
1000.0000 mg | ORAL_TABLET | Freq: Once | ORAL | Status: AC
  Administered 2021-09-09: 10:00:00 1000 mg via ORAL
  Filled 2021-09-09: qty 2

## 2021-09-09 NOTE — MAU Note (Signed)
Pt arrives via EMS with c/o severe abdominal pain rating 10/10 that radiates down to her knees.  Pt reports that she is about [redacted] weeks pregnant.  No vaginal bleeding.

## 2021-09-09 NOTE — MAU Provider Note (Signed)
History     CSN: [id number]  Arrival date and time: 09/09/21 [id number]   Event Date/Time   First Provider Initiated Contact with Patient 09/09/21 [phone]      Chief Complaint  Patient presents with   Abdominal Pain   HPI Peggy Mason is a 25 y.o. [id number] at [redacted]w[redacted]d who presents via EMS for pelvic pain. Symptoms started early this morning. Reports constant pain in pelvis, back, bilateral legs, head, & eyes. Also has new onset non productive cough. No sore throat, ear pain, chest pain, or SOB. Has felt "hot & cold" at home but hasn't checked temp. Pain feels aching. Rates pain 10/10. Hasn't treated symptoms. Denies sick contacts.  Has had n/v throughout the pregnancy but denies nausea or vomiting this morning. Last BM was over a week ago - patient attributes this to not eating much due to n/v.  Denies dysuria, hematuria, vaginal bleeding. Unsure if vaginal discharge but reports feeling "bubbles" in her vaginal area.  She is an established patient with [facility] but hasn't started prenatal care with them yet.   OB History     Gravida  4   Para  3   Term  3   Preterm  0   AB  0   Living  3      SAB  0   IAB  0   Ectopic  0   Multiple  0   Live Births  3           Past Medical History:  Diagnosis Date   Acne    Anemia    Herpes    History of chlamydia infection     Past Surgical History:  Procedure Laterality Date   HERNIA REPAIR     umbilical   WISDOM TOOTH EXTRACTION      Family History  Problem Relation Age of Onset   Diabetes Paternal Grandmother    Asthma Sister    Learning disabilities Sister    Kidney disease Sister        dialysis    Social History   Tobacco Use   Smoking status: Former    Types: Cigars   Smokeless tobacco: Never   Tobacco comments:    not smoked since pregnant, passive smoker  Vaping Use   Vaping Use: Former  Substance Use Topics   Alcohol use: Not Currently   Drug use: Yes    Types: Marijuana    Comment: not since  pregnant    Allergies:  Allergies  Allergen Reactions   Amoxicillin Hives   Penicillins Hives    Has patient had a PCN reaction causing immediate rash, facial/tongue/throat swelling, SOB or lightheadedness with hypotension: Yes Has patient had a PCN reaction causing severe rash involving mucus membranes or skin necrosis: No Has patient had a PCN reaction that required hospitalization Yes Has patient had a PCN reaction occurring within the last 10 years: No If all of the above answers are "NO", then may proceed with Cephalosporin use.    Medications Prior to Admission  Medication Sig Dispense Refill Last Dose   cyclobenzaprine (FLEXERIL) 5 MG tablet Take 1 tablet (5 mg total) by mouth 2 (two) times daily as needed for muscle spasms. 10 tablet 0    Doxylamine-Pyridoxine (DICLEGIS) 10-10 MG TBEC Take 2 tablets by mouth at bedtime. 60 tablet 3    famotidine (PEPCID) 40 MG tablet Take 1 tablet (40 mg total) by mouth at bedtime. 30 tablet 2    ferrous sulfate 325 (  65 FE) MG tablet Take 325 mg by mouth daily with breakfast.      metoCLOPramide (REGLAN) 10 MG tablet Take 1 tablet (10 mg total) by mouth every 6 (six) hours. 30 tablet 0    valACYclovir (VALTREX) 500 MG tablet Take 500 mg by mouth daily.       Review of Systems  Constitutional:  Positive for chills. Negative for fever.  HENT: Negative.    Eyes:  Positive for photophobia.  Respiratory:  Positive for cough. Negative for shortness of breath.   Cardiovascular:  Negative for chest pain.  Gastrointestinal:  Positive for abdominal pain and constipation. Negative for diarrhea, nausea and vomiting.  Genitourinary:  Positive for pelvic pain. Negative for dysuria, flank pain, frequency, vaginal bleeding, vaginal discharge and vaginal pain.  Musculoskeletal:  Positive for back pain and myalgias.  Neurological:  Positive for headaches.  Physical Exam   Blood pressure (!) 93/47, pulse 91, temperature 98.8 F (37.1 C), temperature source  Oral, resp. rate 18, last menstrual period 07/15/2021, SpO2 100 %, unknown if currently breastfeeding.  Physical Exam Vitals and nursing note reviewed. Exam conducted with a chaperone present.  Constitutional:      General: She is in acute distress (pt moaning, curled up in fetal position).     Appearance: She is well-developed. She is not ill-appearing or diaphoretic.  Eyes:     General: No scleral icterus.    Pupils: Pupils are equal, round, and reactive to light.  Cardiovascular:     Rate and Rhythm: Normal rate and regular rhythm.     Heart sounds: Normal heart sounds.  Pulmonary:     Effort: Pulmonary effort is normal. No respiratory distress.     Breath sounds: Normal breath sounds. No wheezing.  Abdominal:     General: Abdomen is flat. Bowel sounds are normal.     Palpations: Abdomen is soft.     Tenderness: There is generalized abdominal tenderness. There is no right CVA tenderness or left CVA tenderness.  Genitourinary:    Comments: NEFG. Cervix closed. No blood.  Skin:    General: Skin is warm and dry.  Neurological:     General: No focal deficit present.     Mental Status: She is alert.  Psychiatric:        Behavior: Behavior normal.   Ultrasound Pt informed that the ultrasound is considered a limited OB ultrasound and is not intended to be a complete ultrasound exam.  Patient also informed that the ultrasound is not being completed with the intent of assessing for fetal or placental anomalies or any pelvic abnormalities.  Explained that the purpose of today's ultrasound is to assess for  viability.  Patient acknowledges the purpose of the exam and the limitations of the study.    Impression: live IUP with FHR 182 bpm  MAU Course  Procedures Results for orders placed or performed during the hospital encounter of 09/09/21 (from the past 24 hour(s))  Urinalysis, Routine w reflex microscopic Urine, Clean Catch     Status: Abnormal   Collection Time: 09/09/21  9:36 AM   Result Value Ref Range   Color, Urine AMBER (A) YELLOW   APPearance CLOUDY (A) CLEAR   Specific Gravity, Urine 1.024 1.005 - 1.030   pH 6.0 5.0 - 8.0   Glucose, UA NEGATIVE NEGATIVE mg/dL   Hgb urine dipstick SMALL (A) NEGATIVE   Bilirubin Urine NEGATIVE NEGATIVE   Ketones, ur 80 (A) NEGATIVE mg/dL   Protein, ur 30 (A) NEGATIVE  mg/dL   Nitrite POSITIVE (A) NEGATIVE   Leukocytes,Ua NEGATIVE NEGATIVE   RBC / HPF 11-20 0 - 5 RBC/hpf   WBC, UA 0-5 0 - 5 WBC/hpf   Bacteria, UA RARE (A) NONE SEEN   Squamous Epithelial / LPF 0-5 0 - 5   Mucus PRESENT   Wet prep, genital     Status: Abnormal   Collection Time: 09/09/21  9:36 AM   Specimen: Urine, Clean Catch  Result Value Ref Range   Yeast Wet Prep HPF POC NONE SEEN NONE SEEN   Trich, Wet Prep NONE SEEN NONE SEEN   Clue Cells Wet Prep HPF POC PRESENT (A) NONE SEEN   WBC, Wet Prep HPF POC >=10 (A) <10   Sperm NONE SEEN   Resp Panel by RT-PCR (Flu A&B, Covid) Nasopharyngeal Swab     Status: None   Collection Time: 09/09/21  9:42 AM   Specimen: Nasopharyngeal Swab; Nasopharyngeal(NP) swabs in vial transport medium  Result Value Ref Range   SARS Coronavirus 2 by RT PCR NEGATIVE NEGATIVE   Influenza A by PCR NEGATIVE NEGATIVE   Influenza B by PCR NEGATIVE NEGATIVE  CBC with Differential/Platelet     Status: Abnormal   Collection Time: 09/09/21 10:34 AM  Result Value Ref Range   WBC 4.2 4.0 - 10.5 K/uL   RBC 4.11 3.87 - 5.11 MIL/uL   Hemoglobin 12.0 12.0 - 15.0 g/dL   HCT 34.9 (L) 36.0 - 46.0 %   MCV 84.9 80.0 - 100.0 fL   MCH 29.2 26.0 - 34.0 pg   MCHC 34.4 30.0 - 36.0 g/dL   RDW 13.2 11.5 - 15.5 %   Platelets 327 150 - 400 K/uL   nRBC 0.0 0.0 - 0.2 %   Neutrophils Relative % 61 %   Neutro Abs 2.6 1.7 - 7.7 K/uL   Lymphocytes Relative 13 %   Lymphs Abs 0.6 (L) 0.7 - 4.0 K/uL   Monocytes Relative 25 %   Monocytes Absolute 1.1 (H) 0.1 - 1.0 K/uL   Eosinophils Relative 0 %   Eosinophils Absolute 0.0 0.0 - 0.5 K/uL   Basophils  Relative 1 %   Basophils Absolute 0.0 0.0 - 0.1 K/uL   Immature Granulocytes 0 %   Abs Immature Granulocytes 0.01 0.00 - 0.07 K/uL  Comprehensive metabolic panel     Status: Abnormal   Collection Time: 09/09/21 10:34 AM  Result Value Ref Range   Sodium 132 (L) 135 - 145 mmol/L   Potassium 3.6 3.5 - 5.1 mmol/L   Chloride 102 98 - 111 mmol/L   CO2 21 (L) 22 - 32 mmol/L   Glucose, Bld 92 70 - 99 mg/dL   BUN 5 (L) 6 - 20 mg/dL   Creatinine, Ser 0.55 0.44 - 1.00 mg/dL   Calcium 9.2 8.9 - 10.3 mg/dL   Total Protein 6.9 6.5 - 8.1 g/dL   Albumin 3.4 (L) 3.5 - 5.0 g/dL   AST 22 15 - 41 U/L   ALT 40 0 - 44 U/L   Alkaline Phosphatase 62 38 - 126 U/L   Total Bilirubin 0.6 0.3 - 1.2 mg/dL   GFR, Estimated >60 >60 mL/min   Anion gap 9 5 - 15    MDM Patient presents with pelvic pain & body aches, also has new onset cough. Temp in MAU is 99.6.  Covid & flu swabs are negative.  U/a is positive for nitrites. She denies flank pain, does not have CVA tenderness, and no leukocytosis.  Vitals & pain improved with IV fluids, tylenol, & flexeril.  Live IUP per [facility]. Cervix closed/thick. Wet prep & GC/CT collected. Wet prep positive for clue cells but has not abnormal discharge.   +UTI. Patient reports penicillin allergy as a child - hives and thinks she may have been hospitalized due to her reaction. Unsure of any other antibiotics she has taken.   S/w Dr. Ilda Basset - inpatient stay for IV rocephin & monitor for reaction vs outpatient management with macrobid.   Dr. Alesia Richards notified of patient's presentation & Dr. Ilda Basset recommendation. [place] for outpatient management on macrobid & will f/u with the patient next week in the office.   Assessment and Plan   1. UTI (urinary tract infection) during pregnancy, first trimester   2. [redacted] weeks gestation of pregnancy    -Rx macrobid -reviewed s/s of pyelonephritis & reasons to return to MAU -GC/CT & urine culture pending -F/u at [place] in 1 week  Jorje Guild 09/09/2021, 12:15 PM

## 2021-09-10 LAB — GC/CHLAMYDIA PROBE AMP (~~LOC~~) NOT AT ARMC
Chlamydia: NEGATIVE
Comment: NEGATIVE
Comment: NORMAL
Neisseria Gonorrhea: NEGATIVE

## 2021-09-28 ENCOUNTER — Encounter (HOSPITAL_COMMUNITY): Payer: Self-pay | Admitting: Obstetrics & Gynecology

## 2021-09-28 ENCOUNTER — Other Ambulatory Visit: Payer: Self-pay

## 2021-09-28 ENCOUNTER — Inpatient Hospital Stay (HOSPITAL_COMMUNITY)
Admission: AD | Admit: 2021-09-28 | Discharge: 2021-09-28 | Disposition: A | Discharge status: Home / Self Care | Payer: Medicaid Other | Attending: Obstetrics & Gynecology | Admitting: Obstetrics & Gynecology

## 2021-09-28 DIAGNOSIS — Y939 Activity, unspecified: Secondary | ICD-10-CM | POA: Diagnosis not present

## 2021-09-28 DIAGNOSIS — Y929 Unspecified place or not applicable: Secondary | ICD-10-CM | POA: Diagnosis not present

## 2021-09-28 DIAGNOSIS — Z3A10 10 weeks gestation of pregnancy: Secondary | ICD-10-CM | POA: Diagnosis not present

## 2021-09-28 DIAGNOSIS — O26891 Other specified pregnancy related conditions, first trimester: Secondary | ICD-10-CM | POA: Insufficient documentation

## 2021-09-28 DIAGNOSIS — M545 Low back pain, unspecified: Secondary | ICD-10-CM | POA: Insufficient documentation

## 2021-09-28 DIAGNOSIS — R519 Headache, unspecified: Secondary | ICD-10-CM | POA: Insufficient documentation

## 2021-09-28 LAB — URINALYSIS, ROUTINE W REFLEX MICROSCOPIC
Bilirubin Urine: NEGATIVE
Glucose, UA: NEGATIVE mg/dL
Hgb urine dipstick: NEGATIVE
Ketones, ur: NEGATIVE mg/dL
Leukocytes,Ua: NEGATIVE
Nitrite: NEGATIVE
Protein, ur: NEGATIVE mg/dL
Specific Gravity, Urine: 1.02 (ref 1.005–1.030)
pH: 8 (ref 5.0–8.0)
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
## 2021-10-01 LAB — OB RESULTS CONSOLE GBS: GBS: POSITIVE

## 2021-10-01 LAB — OB RESULTS CONSOLE RPR: RPR: NONREACTIVE

## 2021-10-01 LAB — OB RESULTS CONSOLE RUBELLA ANTIBODY, IGM: Rubella: NON-IMMUNE/NOT IMMUNE

## 2021-10-01 LAB — HEPATITIS C ANTIBODY: HCV Ab: NEGATIVE

## 2021-10-01 LAB — OB RESULTS CONSOLE HEPATITIS B SURFACE ANTIGEN: Hepatitis B Surface Ag: NEGATIVE

## 2021-10-14 NOTE — L&D Delivery Note (Signed)
Delivery Note Labor onset: 04/19/2022  Labor Onset Time: 1800 Complete dilation at 3:43 AM  Onset of pushing at 0535 FHR second stage Cat 2 Analgesia/Anesthesia intrapartum: epidural  Guided pushing with maternal urge. Delivery of a viable female at 21. Fetal head delivered in ROA position.  Nuchal cord: none.  Infant placed on maternal abd, dried, and tactile stim.  Cord double clamped after 1 min and cut by Father, Caryn Bee.  RN x3 present for birth.  Cord blood sample collected: Yes Arterial cord blood sample collected: No  Placenta delivered Schultz intact, with 3 VC.  Placenta to L&D. Uterine tone firm, bleeding small  No laceration identified.  QBL/EBL (mL): 150 Complications: none APGAR: APGAR (1 MIN): 8   APGAR (5 MINS): 9   APGAR (10 MINS):   Mom to postpartum.  Baby to Couplet care / Skin to Skin.  Roma Schanz DNP, CNM 04/19/2022, 7:41 AM

## 2021-11-06 IMAGING — US US OB < 14 WEEKS - US OB TV
1 series · 15 of 28 positions shown · non-contrast
Comparison: None.

CLINICAL DATA: Abdominal pain.

EXAM:
OBSTETRIC <14 WK US AND TRANSVAGINAL OB US
TECHNIQUE: Both transabdominal and transvaginal ultrasound examinations were
performed for complete evaluation of the gestation as well as the
maternal uterus, adnexal regions, and pelvic cul-de-sac.
Transvaginal technique was performed to assess early pregnancy.

[Series 1: us ob < 14 weeks - us ob tv · 15 of 51 slices shown]
[im 1/51]
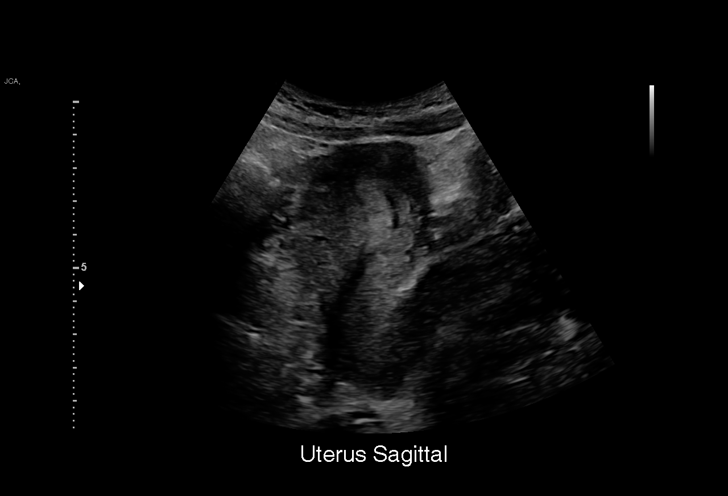
[im 4/51]
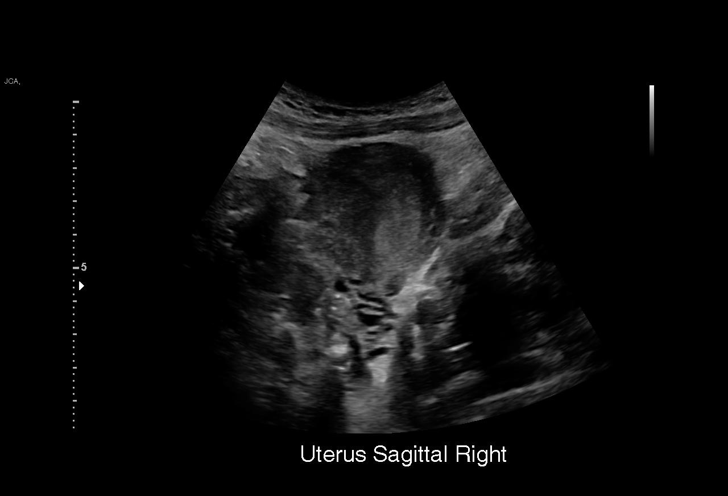
[im 8/51]
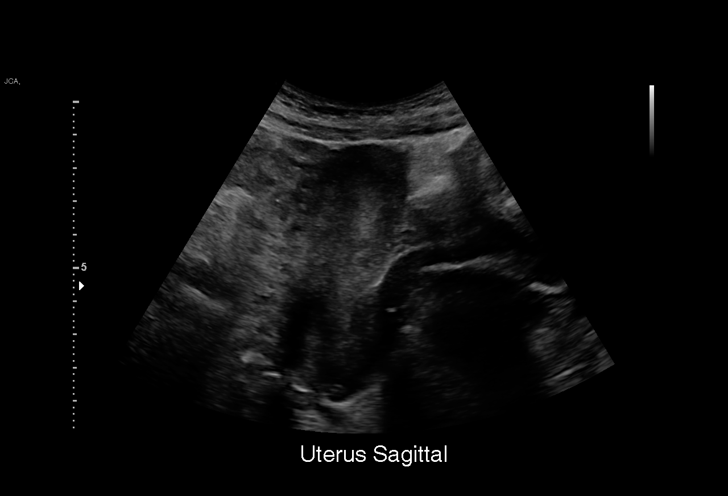
[im 12/51]
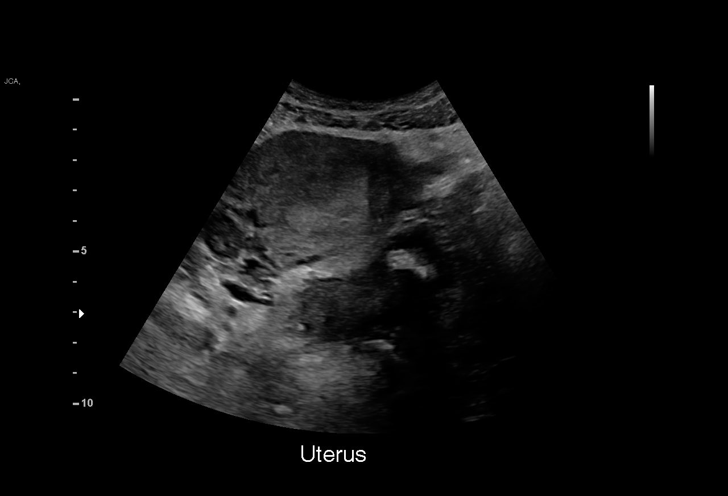
[im 15/51]
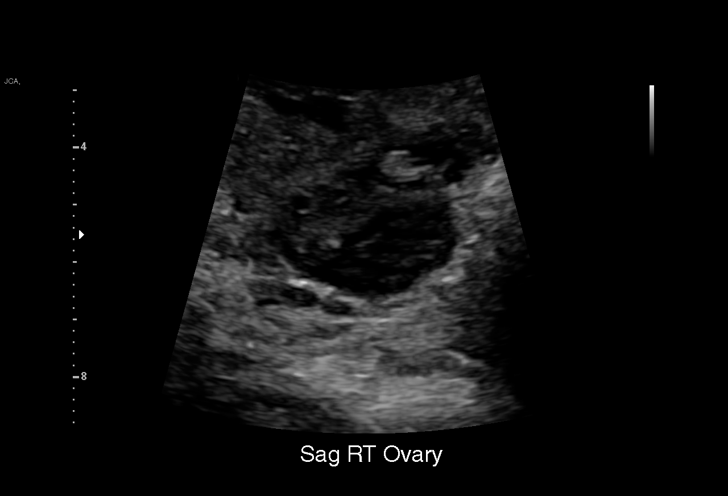
[im 19/51]
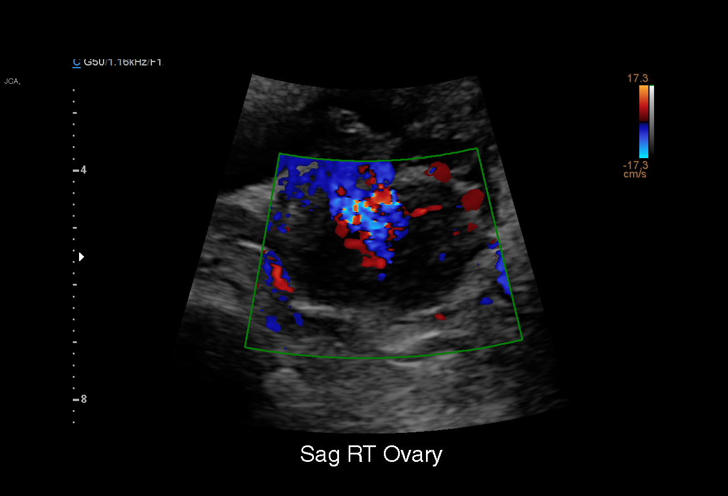
[im 23/51]
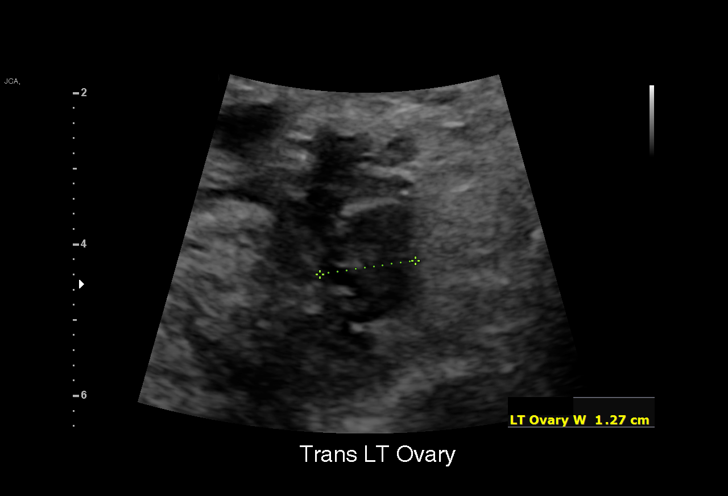
[im 26/51]
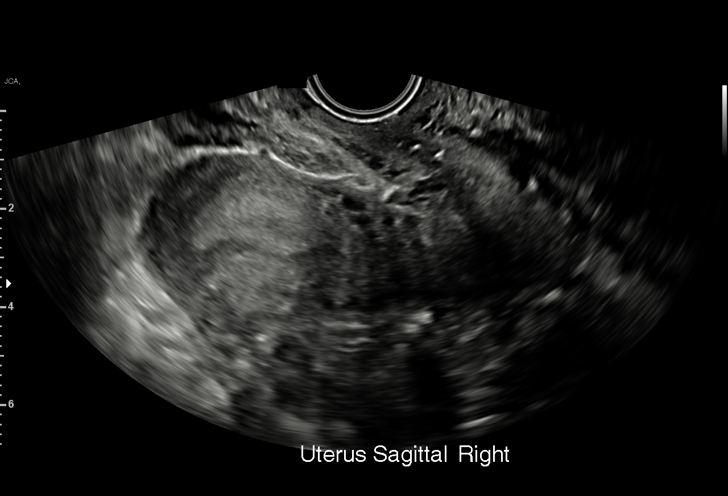
[im 28/51]
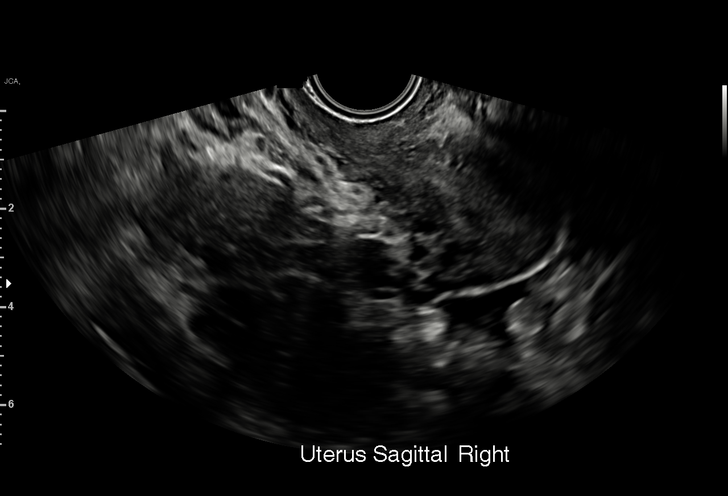
[im 32/51]
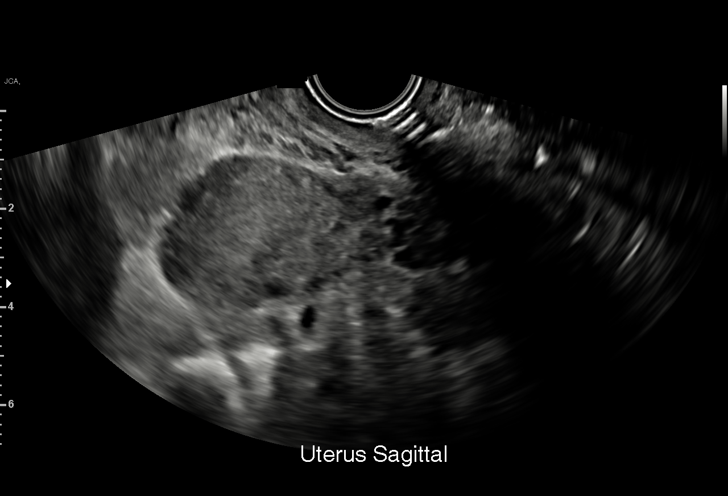
[im 36/51]
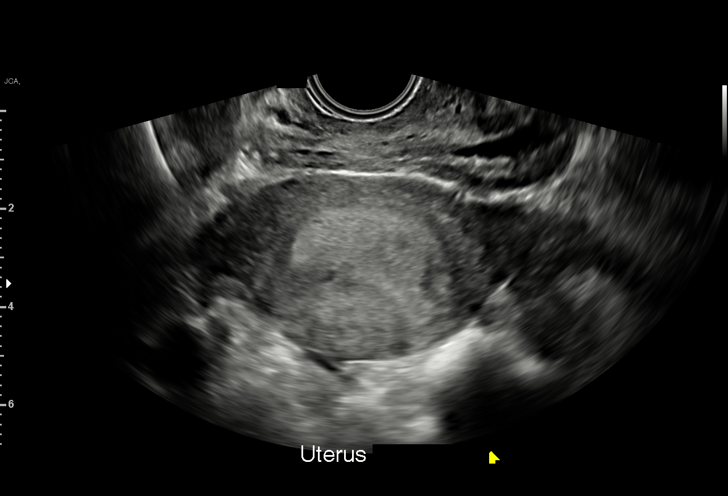
[im 39/51]
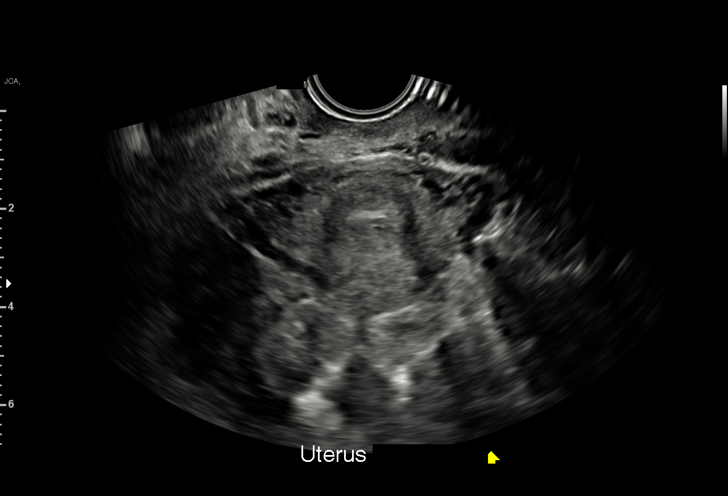
[im 43/51]
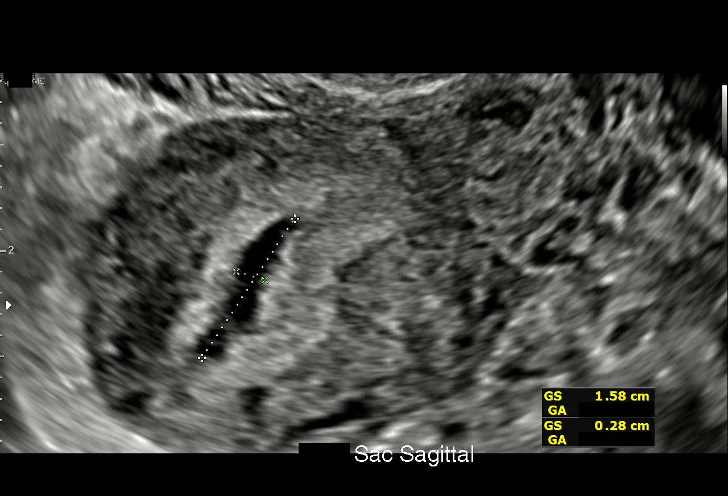
[im 47/51]
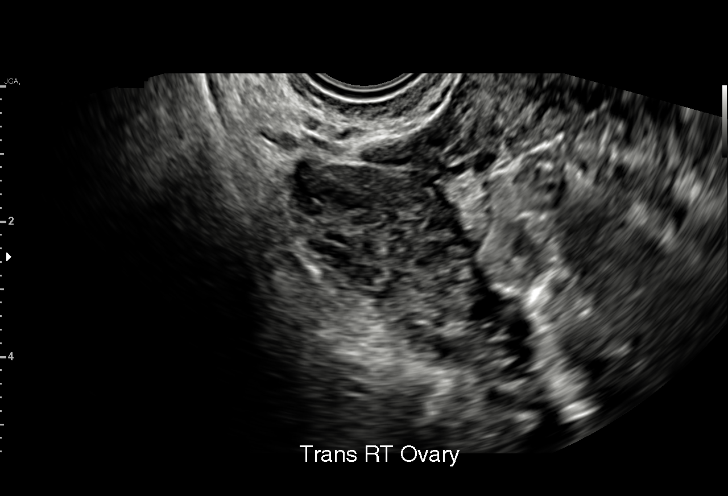
[im 51/51]
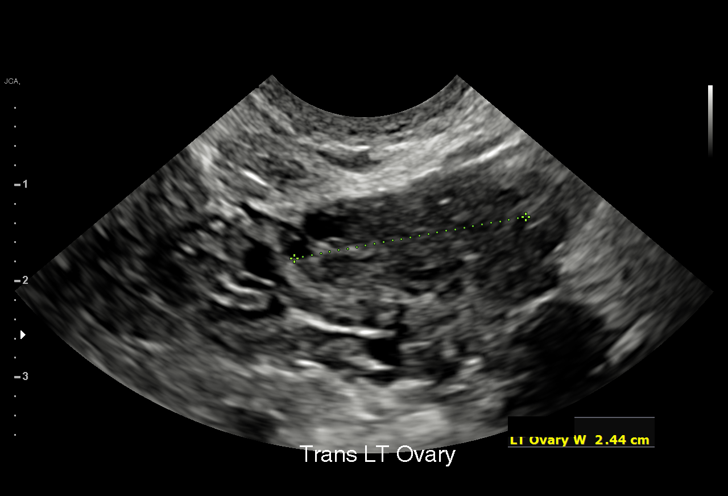

[15 of 28 positions shown; findings below may reference images not displayed]

FINDINGS: Intrauterine gestational sac: Questionable single gestational sac
identified (elongated in shape)

Yolk sac:  Not Visualized.

Embryo:  Not Visualized.

MSD: 6.1 mm   5 w   2 d

Subchorionic hemorrhage:  None visualized.

Maternal uterus/adnexae: Bilateral ovaries appear within normal
limits. Right ovary measures 3.2 x 2.1 x 2.3 cm. Left ovary measures
2.1 x 1.6 x 1.9 cm. There is a small amount of free fluid in the
pelvis.
IMPRESSION: 1. Questionable single irregularly-shaped intrauterine gestational
sac measuring 5 weeks 2 days by crown-rump length. Findings are
suspicious but not yet definitive for failed pregnancy. Recommend
follow-up US in 10-14 days for definitive diagnosis. This
recommendation follows SRU consensus guidelines: Diagnostic Criteria
for Nonviable Pregnancy Early in the First Trimester. N Engl J Med
2. Normal appearance of ovaries.
3. Small amount of free fluid in the pelvis.

## 2021-11-18 IMAGING — US US OB TRANSVAGINAL
1 series · 15 of 28 positions shown · non-contrast
Comparison: None.

CLINICAL DATA: Pain

EXAM:
TRANSVAGINAL OB ULTRASOUND
TECHNIQUE: Transvaginal ultrasound was performed for complete evaluation of the
gestation as well as the maternal uterus, adnexal regions, and
pelvic cul-de-sac.

[Series 1: us ob transvaginal · 44 acquisitions, 15 frames shown]
[im 1/44]
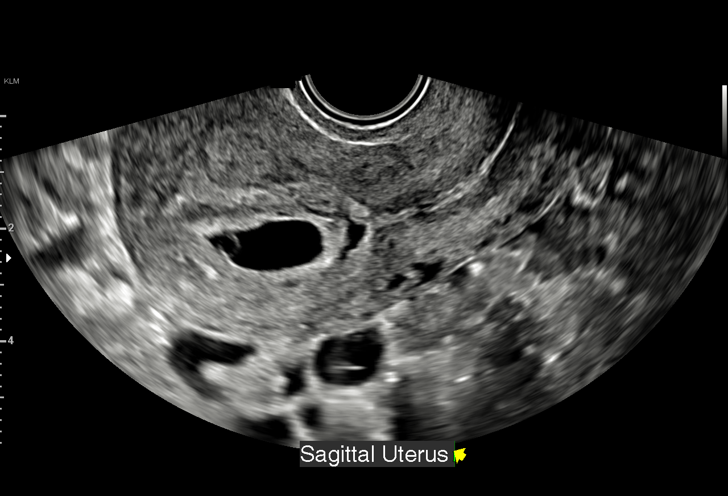
[im 4/44]
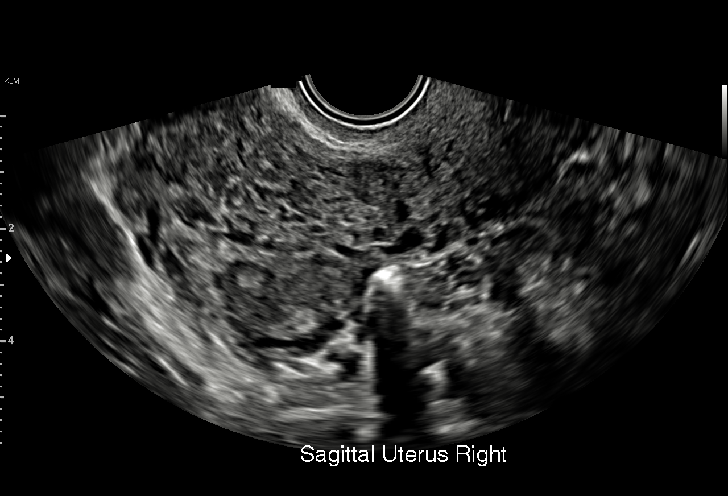
[im 7/44]
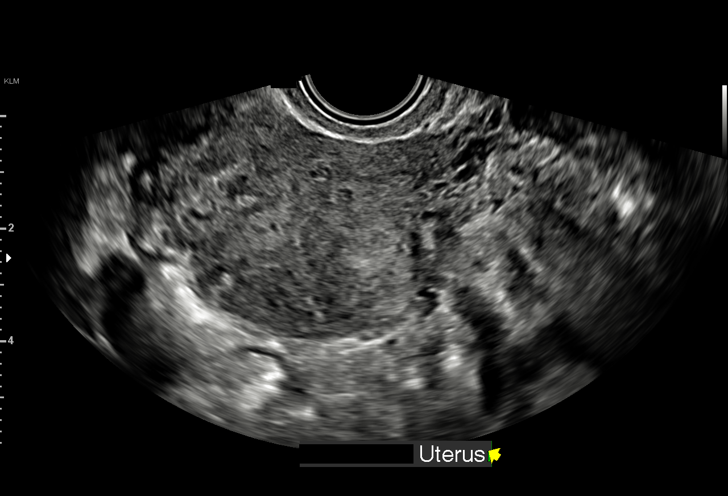
[im 10/44]
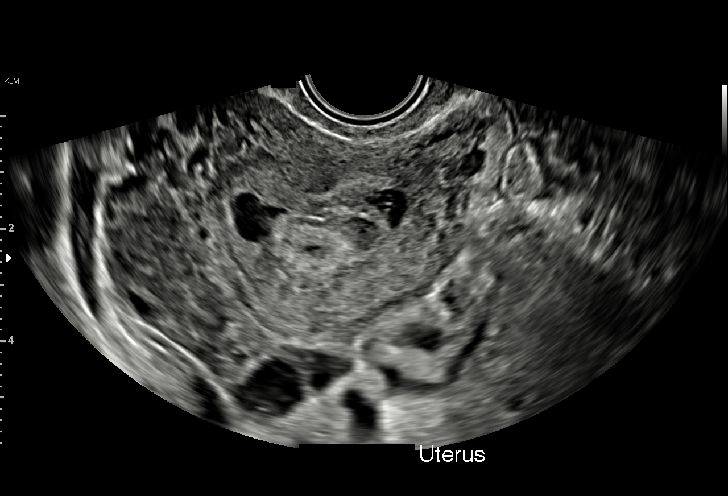
[im 13/44]
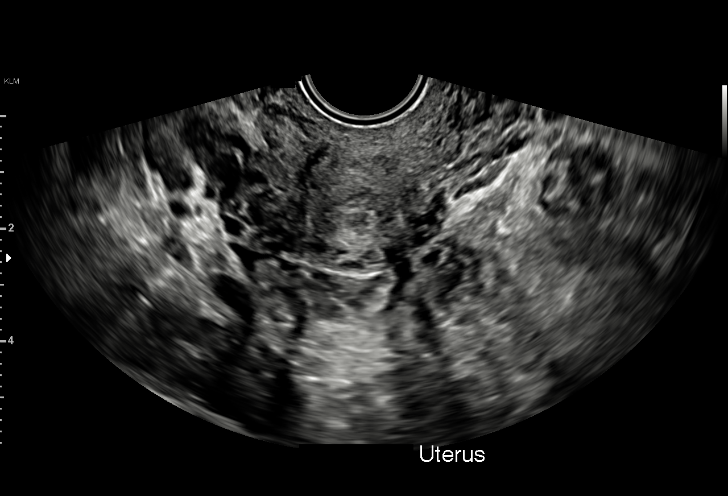
[im 16/44]
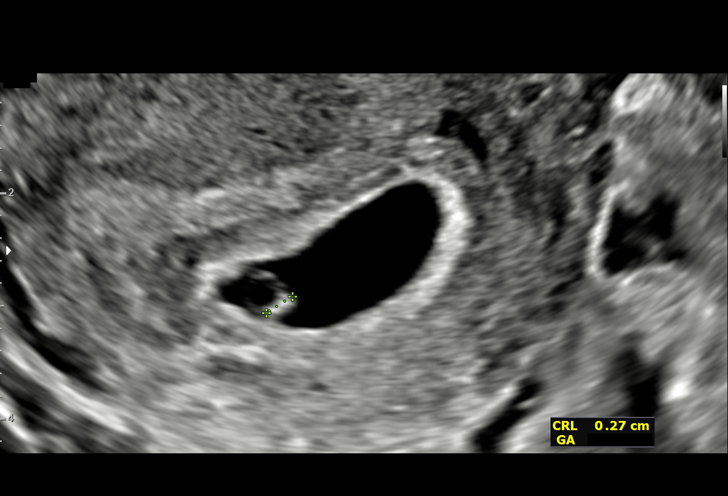
[im 20/44]
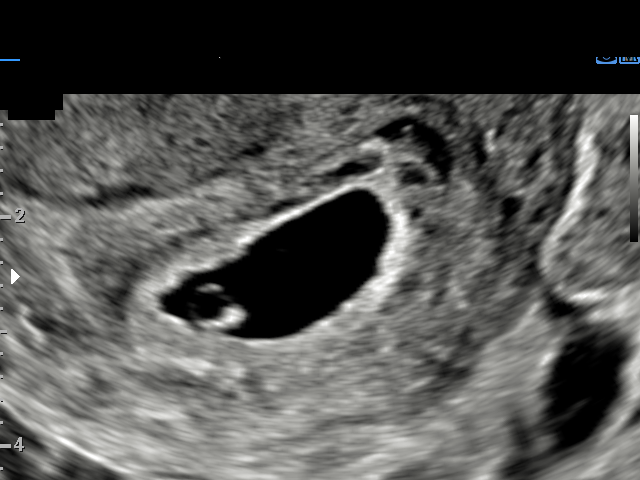
[im 23/44]
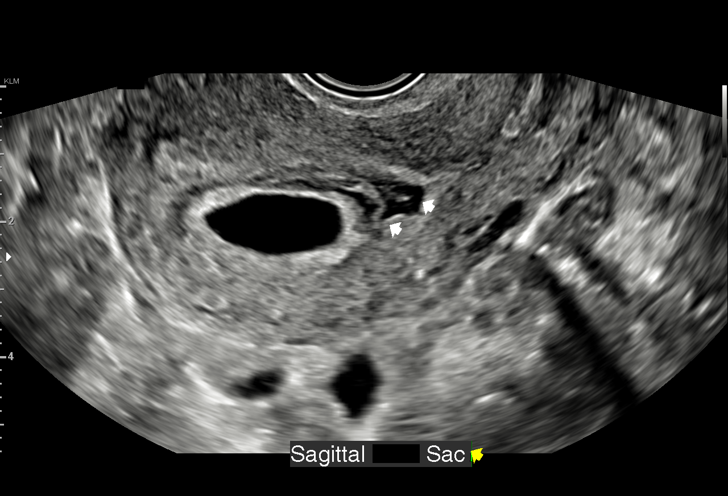
[im 24/44]
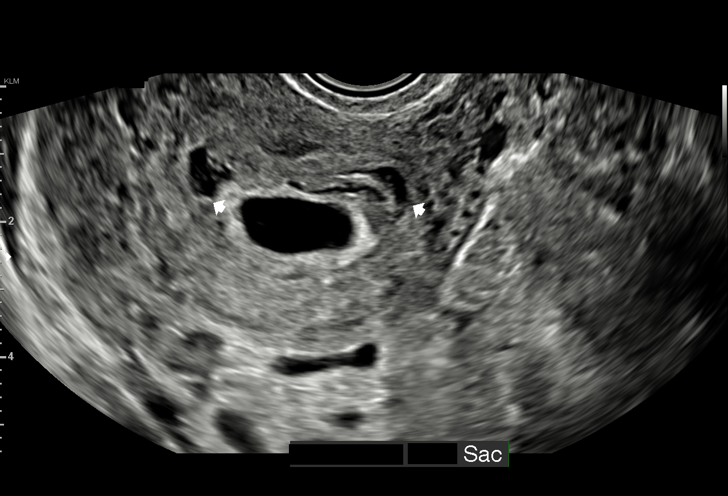
[im 28/44]
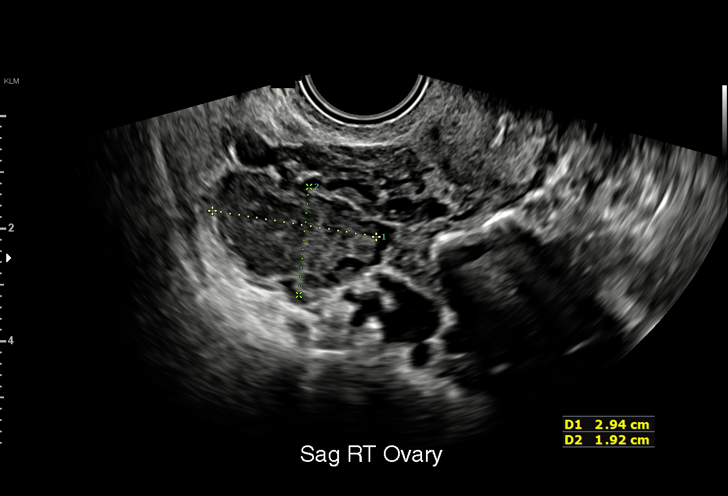
[im 31/44]
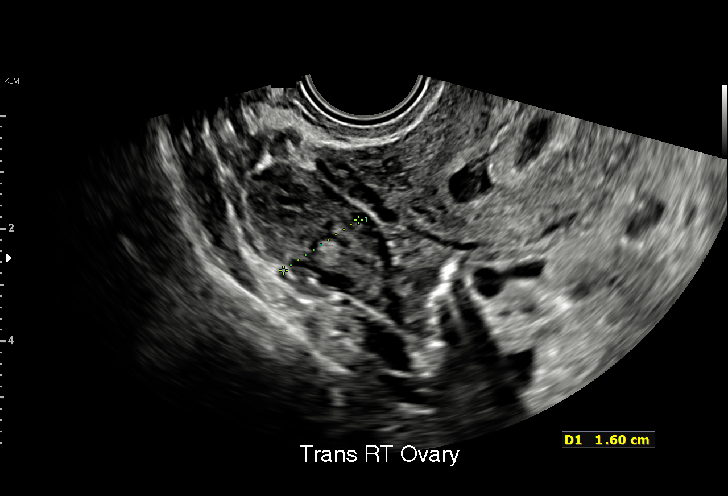
[im 34/44]
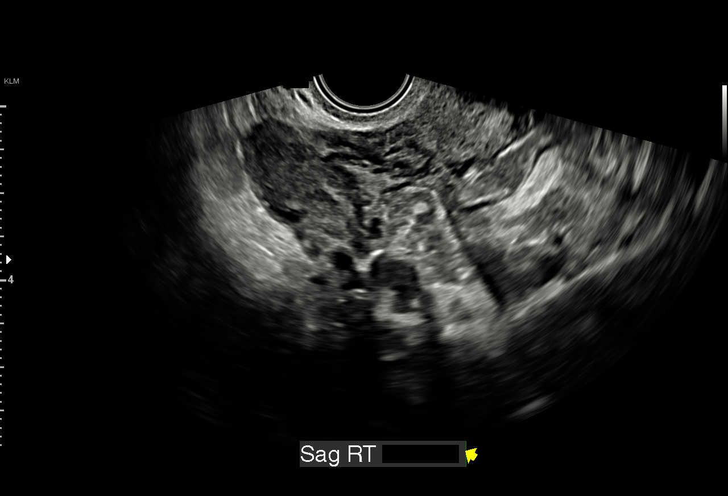
[im 37/44]
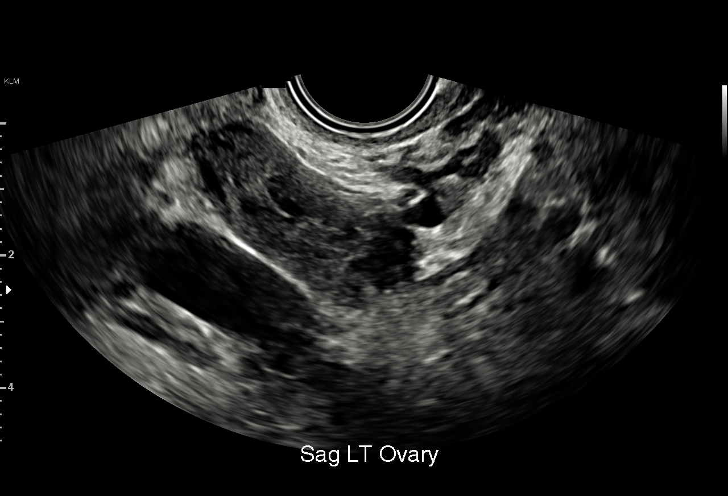
[im 40/44]
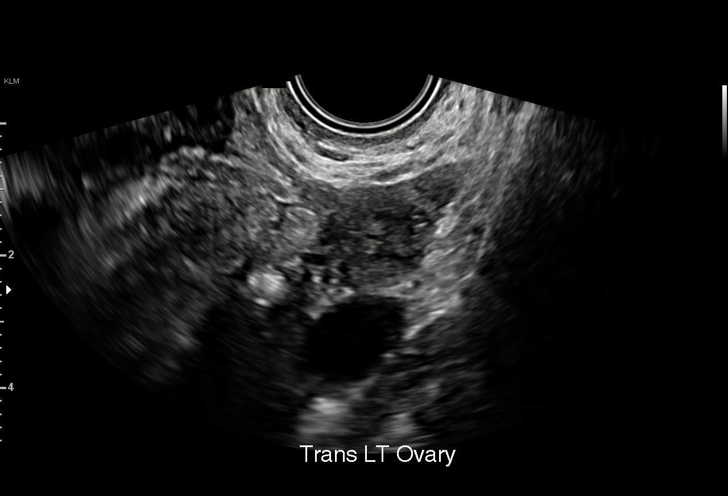
[im 44/44]
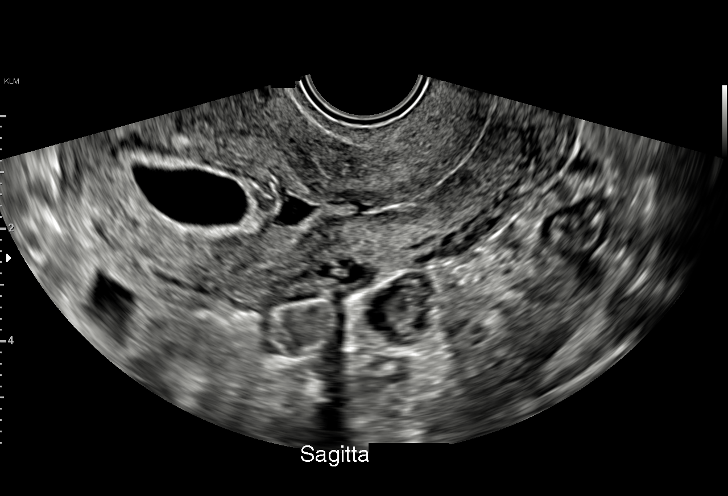

[15 of 28 positions shown; findings below may reference images not displayed]

FINDINGS: Intrauterine gestational sac: Single

Yolk sac:  Visualized.

Embryo:  Visualized.

Cardiac Activity: Visualized.

Heart Rate: 108 bpm

MSD:   mm    w     d

CRL:   7.8 mm   5 w 5 d                  US EDC: 04/24/2022

Subchorionic hemorrhage:  Moderate subchorionic hemorrhage

Maternal uterus/adnexae: No adnexal mass or free fluid
IMPRESSION: 5 week 5 day intrauterine pregnancy. Fetal heart rate 108 beats per
minute. Moderate-sized subchorionic hemorrhage.

## 2022-01-01 ENCOUNTER — Inpatient Hospital Stay (HOSPITAL_COMMUNITY)
Admission: AD | Admit: 2022-01-01 | Discharge: 2022-01-01 | Disposition: A | Discharge status: Home / Self Care | Payer: Medicaid Other | Attending: Obstetrics & Gynecology | Admitting: Obstetrics & Gynecology

## 2022-01-01 ENCOUNTER — Other Ambulatory Visit: Payer: Self-pay

## 2022-01-01 ENCOUNTER — Encounter (HOSPITAL_COMMUNITY): Payer: Self-pay | Admitting: Obstetrics & Gynecology

## 2022-01-01 DIAGNOSIS — J0100 Acute maxillary sinusitis, unspecified: Secondary | ICD-10-CM | POA: Insufficient documentation

## 2022-01-01 DIAGNOSIS — R519 Headache, unspecified: Secondary | ICD-10-CM | POA: Insufficient documentation

## 2022-01-01 DIAGNOSIS — Z3A24 24 weeks gestation of pregnancy: Secondary | ICD-10-CM | POA: Diagnosis not present

## 2022-01-01 DIAGNOSIS — O26892 Other specified pregnancy related conditions, second trimester: Secondary | ICD-10-CM | POA: Insufficient documentation

## 2022-01-01 HISTORY — DX: Headache, unspecified: R51.9

## 2022-01-01 LAB — URINALYSIS, ROUTINE W REFLEX MICROSCOPIC
Bilirubin Urine: NEGATIVE
Glucose, UA: NEGATIVE mg/dL
Hgb urine dipstick: NEGATIVE
Ketones, ur: NEGATIVE mg/dL
Leukocytes,Ua: NEGATIVE
Nitrite: NEGATIVE
Protein, ur: NEGATIVE mg/dL
Specific Gravity, Urine: 1.027 (ref 1.005–1.030)
pH: 6 (ref 5.0–8.0)

## 2022-01-01 MED ORDER — METOCLOPRAMIDE HCL 10 MG PO TABS
10.0000 mg | ORAL_TABLET | Freq: Once | ORAL | Status: AC
Start: 2022-01-01 — End: 2022-01-01
  Administered 2022-01-01: 10 mg via ORAL
  Filled 2022-01-01: qty 1

## 2022-01-01 MED ORDER — ACETAMINOPHEN-CAFFEINE 500-65 MG PO TABS
2.0000 | ORAL_TABLET | Freq: Once | ORAL | Status: AC
  Administered 2022-01-01: 2 via ORAL
  Filled 2022-01-01 (×3): qty 2

## 2022-01-01 MED ORDER — PSEUDOEPHEDRINE HCL 30 MG PO TABS
60.0000 mg | ORAL_TABLET | Freq: Once | ORAL | Status: AC
  Administered 2022-01-01: 60 mg via ORAL
  Filled 2022-01-01: qty 2

## 2022-01-01 NOTE — MAU Note (Signed)
.  Peggy Mason is a 26 y.o. at [redacted]w[redacted]d here in MAU reporting: headache since Friday, states it's worse than a migraine & it's sensitive to light.  States eyes and ears hurt secondary H/A. States was given a prescription for a sinus infection and H/A worse since taking medication. ?Onset of complaint: Friday 12/28/21 ?Pain score: 10 ?Vitals:  ? 01/01/22 1851  ?BP: (!) 111/58  ?Pulse: 95  ?Resp: 19  ?Temp: 98.4 ?F (36.9 ?C)  ?SpO2: 98%  ?   ?FHT: 160 bpm w/ +FM.  Denies LOF and VB. ?Lab orders placed from triage:   UA ?

## 2022-01-01 NOTE — MAU Provider Note (Addendum)
?History  ?  ? ?[id number] ? ?Arrival date and time: 01/01/22 1759 ?  ? ?Chief Complaint  ?Patient presents with  ? Headache  ? ? ? ?HPI ?Peggy Mason is a 26 y.o. at [redacted]w[redacted]d who presents for headache. ?Symptoms started last Thursday. Was diagnosed with a sinus infection by her ob & started on antibiotics but feels like the medicine makes her pain worse. Reports bilateral temporal & maxillary sinus pain. Pain worse when bending over. States her ears & throat hurt when the headache is bad. Denies fever, cough, sick contacts. Rates pain 10/10. Has tried treating with saline flushes. No pain medicine. Sleep helps with pain.   ? ?OB History   ? ? Gravida  ?4  ? Para  ?3  ? Term  ?3  ? Preterm  ?0  ? AB  ?0  ? Living  ?3  ?  ? ? SAB  ?0  ? IAB  ?0  ? Ectopic  ?0  ? Multiple  ?0  ? Live Births  ?3  ?   ?  ?  ? ? ?Past Medical History:  ?Diagnosis Date  ? Acne   ? Anemia   ? Headache   ? Herpes   ? History of chlamydia infection   ? ? ?Past Surgical History:  ?Procedure Laterality Date  ? HERNIA REPAIR    ? umbilical  ? WISDOM TOOTH EXTRACTION    ? ? ?Family History  ?Problem Relation Age of Onset  ? Diabetes Paternal Grandmother   ? Asthma Sister   ? Learning disabilities Sister   ? Kidney disease Sister   ?     dialysis  ? ? ?Allergies  ?Allergen Reactions  ? Amoxicillin Hives  ? Penicillins Hives  ?  Has patient had a PCN reaction causing immediate rash, facial/tongue/throat swelling, SOB or lightheadedness with hypotension: Yes ?Has patient had a PCN reaction causing severe rash involving mucus membranes or skin necrosis: No ?Has patient had a PCN reaction that required hospitalization Yes ?Has patient had a PCN reaction occurring within the last 10 years: No ?If all of the above answers are "NO", then may proceed with Cephalosporin use.  ? ? ?No current facility-administered medications on file prior to encounter.  ? ?Current Outpatient Medications on File Prior to Encounter  ?Medication Sig Dispense Refill  ?  azithromycin (ZITHROMAX) 500 MG tablet Take by mouth daily.    ? Prenatal Vit-Fe Fumarate-FA (PRENATAL MULTIVITAMIN) TABS tablet Take 1 tablet by mouth daily at 12 noon.    ? azithromycin (ZITHROMAX) 250 MG tablet TAKE 2 TABLETS BY MOUTH TODAY, THEN TAKE 1 TABLET DAILY FOR 4 DAYS    ? Cholecalciferol (VITAMIN D3) 50 MCG (2000 UT) capsule Take 4,000 Units by mouth daily.    ? cyclobenzaprine (FLEXERIL) 5 MG tablet Take 1 tablet (5 mg total) by mouth 2 (two) times daily as needed for muscle spasms. 10 tablet 0  ? Doxylamine-Pyridoxine (DICLEGIS) 10-10 MG TBEC Take 2 tablets by mouth at bedtime. 60 tablet 3  ? famotidine (PEPCID) 40 MG tablet Take 1 tablet (40 mg total) by mouth at bedtime. 30 tablet 2  ? ferrous sulfate 325 (65 FE) MG tablet Take 325 mg by mouth daily with breakfast.    ? metoCLOPramide (REGLAN) 10 MG tablet Take 1 tablet (10 mg total) by mouth every 6 (six) hours. 30 tablet 0  ? valACYclovir (VALTREX) 500 MG tablet Take 500 mg by mouth daily.    ? ? ? ?ROS ?  Pertinent positives and negative per HPI, all others reviewed and negative ? ?Physical Exam  ? ?BP 106/61   Pulse 87   Temp 98.4 ?F (36.9 ?C) (Oral)   Resp 19   Ht 5\' 4"  (1.626 m)   Wt 73.9 kg   LMP 07/15/2021   SpO2 100%   BMI 27.96 kg/m?  ? ?Patient Vitals for the past 24 hrs: ? BP Temp Temp src Pulse Resp SpO2 Height Weight  ?01/01/22 1918 106/61 -- -- 87 -- 100 % -- --  ?01/01/22 1851 (!) 111/58 98.4 ?F (36.9 ?C) Oral 95 19 98 % -- --  ?01/01/22 1844 -- -- -- -- -- -- 5\' 4"  (1.626 m) 73.9 kg  ? ? ?Physical Exam ?Vitals and nursing note reviewed.  ?Constitutional:   ?   General: She is not in acute distress. ?   Appearance: Normal appearance.  ?HENT:  ?   Head: Normocephalic and atraumatic.  ?   Nose:  ?   Right Sinus: Maxillary sinus tenderness present.  ?   Left Sinus: Maxillary sinus tenderness present.  ?Eyes:  ?   General: No scleral icterus. ?   Conjunctiva/sclera: Conjunctivae normal.  ?Pulmonary:  ?   Effort: Pulmonary effort is  normal. No respiratory distress.  ?Skin: ?   General: Skin is warm and dry.  ?Neurological:  ?   Mental Status: She is alert.  ?Psychiatric:     ?   Mood and Affect: Mood normal.     ?   Behavior: Behavior normal.  ?  ? ? ?FHT ?Baseline 155, moderate variability, 10x10 accels, no decels ?Toco: flat ?Cat: 1 ? ?Labs ?Results for orders placed or performed during the hospital encounter of 01/01/22 (from the past 24 hour(s))  ?Urinalysis, Routine w reflex microscopic Urine, Clean Catch     Status: Abnormal  ? Collection Time: 01/01/22  6:38 PM  ?Result Value Ref Range  ? Color, Urine YELLOW YELLOW  ? APPearance HAZY (A) CLEAR  ? Specific Gravity, Urine 1.027 1.005 - 1.030  ? pH 6.0 5.0 - 8.0  ? Glucose, UA NEGATIVE NEGATIVE mg/dL  ? Hgb urine dipstick NEGATIVE NEGATIVE  ? Bilirubin Urine NEGATIVE NEGATIVE  ? Ketones, ur NEGATIVE NEGATIVE mg/dL  ? Protein, ur NEGATIVE NEGATIVE mg/dL  ? Nitrite NEGATIVE NEGATIVE  ? Leukocytes,Ua NEGATIVE NEGATIVE  ? ? ?Imaging ?No results found. ? ?MAU Course  ?Procedures ?Lab Orders    ?     Urinalysis, Routine w reflex microscopic Urine, Clean Catch    ?Meds ordered this encounter  ?Medications  ? Acetaminophen-Caffeine 500-65 MG TABS 2 tablet  ? metoCLOPramide (REGLAN) tablet 10 mg  ? pseudoephedrine (SUDAFED) tablet 60 mg  ? ?Imaging Orders  ?No imaging studies ordered today  ? ? ?MDM ?Patient normotensive. Exam & complaint consistent with sinusitis. Given tylenol & caffeine with some relief but patient reports headache still present & now has nausea. Reglan & sudafed ordered.  ? ?Care turned over to [facility] CNM ?01/03/22, NP 01/01/2022 8:58 PM   ? ?Patient is feeling a little better. Headache is worse when bending over and isolated to maxillary region. Patient has not been taking abx, so will encourage her to take the abx and provide OTC treatments for sinus pain/pressure.  ?Assessment and Plan  ? ?1. Acute nonintractable headache, unspecified headache type   ?2. Acute  non-recurrent maxillary sinusitis   ? ?DC home ?Comfort measures reviewed  ?List of pregnancy safe OTC meds given to patient for  supportive care at home.  ?3rd Trimester precautions  ?PTL precautions  ?Fetal kick counts ?ZO:XWRURX:none ?Return to MAU as needed ?FU with OB as planned ? ? Follow-up Information   ? ? [facility].   ?Specialty: Obstetrics and Gynecology ?Contact information: ?[address] ?[address] ?[place] [phone] ?[phone] ? ?  ?  ? ?  ?  ? ?  ? ?Thressa ShellerHeather Itamar Mcgowan DNP, CNM  ?01/01/22  9:33 PM  ? ?

## 2022-01-01 NOTE — Discharge Instructions (Signed)

## 2022-01-02 MED FILL — Acetaminophen-Caffeine Tab 500-65 MG: ORAL | Qty: 2 | Status: AC

## 2022-01-25 LAB — OB RESULTS CONSOLE RPR: RPR: NONREACTIVE

## 2022-01-25 LAB — OB RESULTS CONSOLE HIV ANTIBODY (ROUTINE TESTING): HIV: NONREACTIVE

## 2022-03-05 ENCOUNTER — Inpatient Hospital Stay (HOSPITAL_COMMUNITY)
Admission: AD | Admit: 2022-03-05 | Discharge: 2022-03-05 | Disposition: A | Discharge status: Home / Self Care | Payer: Medicaid Other | Attending: Obstetrics & Gynecology | Admitting: Obstetrics & Gynecology

## 2022-03-05 ENCOUNTER — Encounter (HOSPITAL_COMMUNITY): Payer: Self-pay | Admitting: Obstetrics & Gynecology

## 2022-03-05 DIAGNOSIS — O212 Late vomiting of pregnancy: Secondary | ICD-10-CM | POA: Diagnosis not present

## 2022-03-05 DIAGNOSIS — R109 Unspecified abdominal pain: Secondary | ICD-10-CM | POA: Diagnosis not present

## 2022-03-05 DIAGNOSIS — O4702 False labor before 37 completed weeks of gestation, second trimester: Secondary | ICD-10-CM

## 2022-03-05 DIAGNOSIS — O4703 False labor before 37 completed weeks of gestation, third trimester: Secondary | ICD-10-CM | POA: Insufficient documentation

## 2022-03-05 DIAGNOSIS — Z3A33 33 weeks gestation of pregnancy: Secondary | ICD-10-CM | POA: Diagnosis not present

## 2022-03-05 DIAGNOSIS — O4700 False labor before 37 completed weeks of gestation, unspecified trimester: Secondary | ICD-10-CM

## 2022-03-05 DIAGNOSIS — O26893 Other specified pregnancy related conditions, third trimester: Secondary | ICD-10-CM | POA: Diagnosis present

## 2022-03-05 LAB — URINALYSIS, ROUTINE W REFLEX MICROSCOPIC
Bilirubin Urine: NEGATIVE
Glucose, UA: NEGATIVE mg/dL
Ketones, ur: NEGATIVE mg/dL
Leukocytes,Ua: NEGATIVE
Nitrite: NEGATIVE
Protein, ur: NEGATIVE mg/dL
Specific Gravity, Urine: 1.017 (ref 1.005–1.030)
pH: 7 (ref 5.0–8.0)

## 2022-03-05 LAB — COMPREHENSIVE METABOLIC PANEL
ALT: 10 U/L (ref 0–44)
AST: 20 U/L (ref 15–41)
Albumin: 2.8 g/dL — ABNORMAL LOW (ref 3.5–5.0)
Alkaline Phosphatase: 170 U/L — ABNORMAL HIGH (ref 38–126)
Anion gap: 8 (ref 5–15)
BUN: 9 mg/dL (ref 6–20)
CO2: 21 mmol/L — ABNORMAL LOW (ref 22–32)
Calcium: 8.6 mg/dL — ABNORMAL LOW (ref 8.9–10.3)
Chloride: 108 mmol/L (ref 98–111)
Creatinine, Ser: 0.68 mg/dL (ref 0.44–1.00)
GFR, Estimated: >60 mL/min (ref >60)
Glucose, Bld: 84 mg/dL (ref 70–99)
Potassium: 3.6 mmol/L (ref 3.5–5.1)
Sodium: 137 mmol/L (ref 135–145)
Total Bilirubin: 0.7 mg/dL (ref 0.3–1.2)
Total Protein: 6.7 g/dL (ref 6.5–8.1)

## 2022-03-05 LAB — CBC
HCT: 34.9 % — ABNORMAL LOW (ref 36.0–46.0)
Hemoglobin: 11.2 g/dL — ABNORMAL LOW (ref 12.0–15.0)
MCH: 28.9 pg (ref 26.0–34.0)
MCHC: 32.1 g/dL (ref 30.0–36.0)
MCV: 89.9 fL (ref 80.0–100.0)
Platelets: 265 10*3/uL (ref 150–400)
RBC: 3.88 MIL/uL (ref 3.87–5.11)
RDW: 14.1 % (ref 11.5–15.5)
WBC: 8.8 10*3/uL (ref 4.0–10.5)
nRBC: 0 % (ref 0.0–0.2)

## 2022-03-05 LAB — LIPASE, BLOOD: Lipase: 28 U/L (ref 11–51)

## 2022-03-05 LAB — AMYLASE: Amylase: 81 U/L (ref 28–100)

## 2022-03-05 MED ORDER — HYDROMORPHONE HCL 1 MG/ML IJ SOLN
0.5000 mg | Freq: Once | INTRAMUSCULAR | Status: AC
  Administered 2022-03-05: 0.5 mg via INTRAVENOUS
  Filled 2022-03-05: qty 1

## 2022-03-05 MED ORDER — ONDANSETRON HCL 4 MG/2ML IJ SOLN
4.0000 mg | Freq: Once | INTRAMUSCULAR | Status: AC
Start: 2022-03-05 — End: 2022-03-05
  Administered 2022-03-05: 4 mg via INTRAVENOUS
  Filled 2022-03-05: qty 2

## 2022-03-05 MED ORDER — LACTATED RINGERS IV BOLUS
1000.0000 mL | INTRAVENOUS | Status: AC
  Administered 2022-03-05: 1000 mL via INTRAVENOUS

## 2022-03-05 NOTE — MAU Note (Signed)
...  Peggy Mason is a 26 y.o. at [redacted]w[redacted]d here in MAU reporting: Constant severe abdominal pain since 0745 this morning. She reports it was initially left sided and has now traveled to her entire stomach since vomiting around 0845. She reports her abdominal pain is worse with fetal movement. She reports she ate a biscuit around 0800/0830 and threw it up shortly after. Denies VB or LOF. +FM.   Pain score: 10/10 generalized abdominal pain  FHT: 145 initial external Lab orders placed from triage: UA

## 2022-03-05 NOTE — MAU Provider Note (Signed)
History     CSN: [id number]  Arrival date and time: 03/05/22 1021   Event Date/Time   First Provider Initiated Contact with Patient 03/05/22 1103      Chief Complaint  Patient presents with   Emesis   Abdominal Pain   Nausea   Peggy Mason 26 y.o. G9E0100 at [redacted]w[redacted]d Presents for abdominal pain that started this around 0745. Pt attempted to eat, vomited and pain worsened afterwards. She complatins of 10/10 pain predominately on the left side, but generalized pain all over. She's had 1 additional episode of vomiting since arrival, but none since. She endorses +FM. No vaginal bleeding, LOF.    OB History     Gravida  4   Para  3   Term  3   Preterm  0   AB  0   Living  3      SAB  0   IAB  0   Ectopic  0   Multiple  0   Live Births  3           Past Medical History:  Diagnosis Date   Acne    Anemia    Headache    Herpes    History of chlamydia infection     Past Surgical History:  Procedure Laterality Date   HERNIA REPAIR     umbilical   WISDOM TOOTH EXTRACTION      Family History  Problem Relation Age of Onset   Diabetes Paternal Grandmother    Asthma Sister    Learning disabilities Sister    Kidney disease Sister        dialysis    Social History   Tobacco Use   Smoking status: Former    Types: Cigars   Smokeless tobacco: Never   Tobacco comments:    not smoked since pregnant, passive smoker  Vaping Use   Vaping Use: Former  Substance Use Topics   Alcohol use: Not Currently   Drug use: Not Currently    Types: Marijuana    Comment: not since pregnant    Allergies:  Allergies  Allergen Reactions   Amoxicillin Hives   Penicillins Hives    Has patient had a PCN reaction causing immediate rash, facial/tongue/throat swelling, SOB or lightheadedness with hypotension: Yes Has patient had a PCN reaction causing severe rash involving mucus membranes or skin necrosis: No Has patient had a PCN reaction that required  hospitalization Yes Has patient had a PCN reaction occurring within the last 10 years: No If all of the above answers are "NO", then may proceed with Cephalosporin use.    Medications Prior to Admission  Medication Sig Dispense Refill Last Dose   azithromycin (ZITHROMAX) 250 MG tablet TAKE 2 TABLETS BY MOUTH TODAY, THEN TAKE 1 TABLET DAILY FOR 4 DAYS      azithromycin (ZITHROMAX) 500 MG tablet Take by mouth daily.      Cholecalciferol (VITAMIN D3) 50 MCG (2000 UT) capsule Take 4,000 Units by mouth daily.      cyclobenzaprine (FLEXERIL) 5 MG tablet Take 1 tablet (5 mg total) by mouth 2 (two) times daily as needed for muscle spasms. 10 tablet 0    Doxylamine-Pyridoxine (DICLEGIS) 10-10 MG TBEC Take 2 tablets by mouth at bedtime. 60 tablet 3    famotidine (PEPCID) 40 MG tablet Take 1 tablet (40 mg total) by mouth at bedtime. 30 tablet 2    ferrous sulfate 325 (65 FE) MG tablet Take 325 mg by mouth daily  with breakfast.      metoCLOPramide (REGLAN) 10 MG tablet Take 1 tablet (10 mg total) by mouth every 6 (six) hours. 30 tablet 0    Prenatal Vit-Fe Fumarate-FA (PRENATAL MULTIVITAMIN) TABS tablet Take 1 tablet by mouth daily at 12 noon.      valACYclovir (VALTREX) 500 MG tablet Take 500 mg by mouth daily.       Review of Systems  Constitutional:  Positive for appetite change. Negative for diaphoresis, fatigue and fever.  Respiratory:  Negative for apnea, cough, chest tightness and shortness of breath.   Gastrointestinal:  Positive for abdominal pain, nausea and vomiting. Negative for abdominal distention and diarrhea.  Genitourinary:  Negative for flank pain, pelvic pain, vaginal bleeding, vaginal discharge and vaginal pain.  Musculoskeletal:  Negative for back pain.  Physical Exam   Blood pressure 111/70, pulse (!) 101, temperature 98.3 F (36.8 C), temperature source Oral, resp. rate 20, last menstrual period 07/15/2021, SpO2 100 %, unknown if currently breastfeeding.  Physical  Exam Vitals and nursing note reviewed. Exam conducted with a chaperone present.  Constitutional:      General: She is in acute distress.     Appearance: She is normal weight.     Comments: Pt visibly uncomfortable, writhing in bed.   HENT:     Head: Normocephalic.  Cardiovascular:     Rate and Rhythm: Tachycardia present.  Pulmonary:     Effort: Pulmonary effort is normal. No respiratory distress.  Abdominal:     Palpations: Abdomen is soft.     Tenderness: There is abdominal tenderness in the left upper quadrant and left lower quadrant. There is guarding. There is no right CVA tenderness or left CVA tenderness.     Comments: Pregnant. Abdomen generally soft, firm uterus consistent with contractions.   Genitourinary:    Vagina: Normal.     Cervix: Normal.     Uterus: Enlarged.   Skin:    General: Skin is warm and dry.  Neurological:     Mental Status: She is alert.  Psychiatric:        Mood and Affect: Mood normal.   Dilation: Fingertip Effacement (%): Thick Cervical Position: Posterior Exam by:: Sandra Cockayne, CNM.   MAU Course  Procedures Lab Orders         Urinalysis, Routine w reflex microscopic Urine, Clean Catch         Comprehensive metabolic panel         CBC         Lipase, blood         Amylase    Meds ordered this encounter  Medications   HYDROmorphone (DILAUDID) injection 0.5 mg   ondansetron (ZOFRAN) injection 4 mg   lactated ringers bolus 1,000 mL   Results for orders placed or performed during the hospital encounter of 03/05/22 (from the past 24 hour(s))  Urinalysis, Routine w reflex microscopic Urine, Clean Catch     Status: Abnormal   Collection Time: 03/05/22 10:53 AM  Result Value Ref Range   Color, Urine YELLOW YELLOW   APPearance CLOUDY (A) CLEAR   Specific Gravity, Urine 1.017 1.005 - 1.030   pH 7.0 5.0 - 8.0   Glucose, UA NEGATIVE NEGATIVE mg/dL   Hgb urine dipstick SMALL (A) NEGATIVE   Bilirubin Urine NEGATIVE NEGATIVE   Ketones,  ur NEGATIVE NEGATIVE mg/dL   Protein, ur NEGATIVE NEGATIVE mg/dL   Nitrite NEGATIVE NEGATIVE   Leukocytes,Ua NEGATIVE NEGATIVE   RBC / HPF 21-50 0 -  5 RBC/hpf   WBC, UA 0-5 0 - 5 WBC/hpf   Bacteria, UA FEW (A) NONE SEEN   Squamous Epithelial / LPF 0-5 0 - 5   Mucus PRESENT   Comprehensive metabolic panel     Status: Abnormal   Collection Time: 03/05/22 11:13 AM  Result Value Ref Range   Sodium 137 135 - 145 mmol/L   Potassium 3.6 3.5 - 5.1 mmol/L   Chloride 108 98 - 111 mmol/L   CO2 21 (L) 22 - 32 mmol/L   Glucose, Bld 84 70 - 99 mg/dL   BUN 9 6 - 20 mg/dL   Creatinine, Ser 1.610.68 0.44 - 1.00 mg/dL   Calcium 8.6 (L) 8.9 - 10.3 mg/dL   Total Protein 6.7 6.5 - 8.1 g/dL   Albumin 2.8 (L) 3.5 - 5.0 g/dL   AST 20 15 - 41 U/L   ALT 10 0 - 44 U/L   Alkaline Phosphatase 170 (H) 38 - 126 U/L   Total Bilirubin 0.7 0.3 - 1.2 mg/dL   GFR, Estimated >09>60 >60>60 mL/min   Anion gap 8 5 - 15  CBC     Status: Abnormal   Collection Time: 03/05/22 11:13 AM  Result Value Ref Range   WBC 8.8 4.0 - 10.5 K/uL   RBC 3.88 3.87 - 5.11 MIL/uL   Hemoglobin 11.2 (L) 12.0 - 15.0 g/dL   HCT 45.434.9 (L) 09.836.0 - 11.946.0 %   MCV 89.9 80.0 - 100.0 fL   MCH 28.9 26.0 - 34.0 pg   MCHC 32.1 30.0 - 36.0 g/dL   RDW 14.714.1 82.911.5 - 56.215.5 %   Platelets 265 150 - 400 K/uL   nRBC 0.0 0.0 - 0.2 %  Lipase, blood     Status: None   Collection Time: 03/05/22 11:13 AM  Result Value Ref Range   Lipase 28 11 - 51 U/L  Amylase     Status: None   Collection Time: 03/05/22 11:13 AM  Result Value Ref Range   Amylase 81 28 - 100 U/L     Fetal Heart rate 130 moderate variability accels present, no decels noted. - Cat I   Cervical exam: unchanged.   MDM Low suspicion for abruption, or spontaneous onset of labor.  Pain improved with Dilaudid  Assessment and Plan  Contractions - Pain relieved with IV Hydration and IV 0.5 Dilaudid.  - Fetal Heart Tracing: Category I.  - Patient discharged home in stable condition.  - Routine labor  precautions reviewed  - Follow up with OB/GYN   Claudette HeadShay M Taher Vannote, CNM  03/05/2022, 11:03 AM

## 2022-04-18 ENCOUNTER — Other Ambulatory Visit: Payer: Self-pay

## 2022-04-18 ENCOUNTER — Inpatient Hospital Stay (EMERGENCY_DEPARTMENT_HOSPITAL)
Admission: AD | Admit: 2022-04-18 | Discharge: 2022-04-18 | Disposition: A | Discharge status: Home / Self Care | Payer: Medicaid Other | Source: Home / Self Care | Attending: Obstetrics and Gynecology | Admitting: Obstetrics and Gynecology

## 2022-04-18 ENCOUNTER — Encounter (HOSPITAL_COMMUNITY): Payer: Self-pay | Admitting: Obstetrics and Gynecology

## 2022-04-18 DIAGNOSIS — O471 False labor at or after 37 completed weeks of gestation: Secondary | ICD-10-CM | POA: Insufficient documentation

## 2022-04-18 DIAGNOSIS — O479 False labor, unspecified: Secondary | ICD-10-CM

## 2022-04-18 DIAGNOSIS — Z3A39 39 weeks gestation of pregnancy: Secondary | ICD-10-CM | POA: Insufficient documentation

## 2022-04-18 NOTE — MAU Provider Note (Signed)
Event Date/Time   First Provider Initiated Contact with Patient 04/18/22 1456       S: [name] is a 26 y.o. G4P3003 at [redacted]w[redacted]d  who presents to MAU today complaining contractions q 5-10 minutes since 0300 this morning. She denies large amounts of vaginal bleeding, but has seen some small bloody show. She denies LOF. She reports normal fetal movement.    O: BP 122/75 (BP Location: Right Arm)   Pulse (!) 115   Temp 98.6 F (37 C) (Oral)   Resp 18   Ht 5\' 4"  (1.626 m)   Wt 89.7 kg   LMP 07/15/2021   SpO2 99%   BMI 33.95 kg/m  GENERAL: Well-developed, well-nourished female in no acute distress.  HEAD: Normocephalic, atraumatic.  CHEST: Normal effort of breathing, regular heart rate ABDOMEN: Soft, nontender, gravid  Cervical exam:  Dilation: 4 Effacement (%): 60 Cervical Position: Posterior Station: -2 Presentation: Vertex Exam by:: K.Wilson,RN   Fetal Monitoring: Baseline: 145 bpm Variability: Moderate variability Accelerations: present Decelerations: absent Contractions: q10-12 minutes.    A: SIUP at [redacted]w[redacted]d  False labor - Cervical exam unchanged. -  FHT reactive and reassuring.   P: 1. False labor   2. [redacted] weeks gestation of pregnancy   - Discussed with patient that this is early labor and discussed the unpredictability of how long it can last.  - Reviewed labor readiness with patient including the [redacted]w[redacted]d, evening primrose oil, intercourse and raspberry leaf tea.  - Discussed the 4-1-1 rule with the patient and term labor precautions reviewed.  - FHT: Cat I upon discharge.  - Patient discharged home in stable condition and may return to MAU as needed.   [employer], CNM 04/18/2022 3:04 PM

## 2022-04-18 NOTE — MAU Note (Signed)
Peggy Mason is a 26 y.o. at [redacted]w[redacted]d here in MAU reporting: sent from MD office for labor evaluation.  Reports ctxs are approx 5-10 minutes apart, began this morning @ 0300.  Denies VB, endorses bloody show.  Endorses +FM. LMP: N/A Onset of complaint: today Pain score: 8/10 Vitals:   04/18/22 1219  BP: 124/69  Pulse: (!) 103  Resp: 18  Temp: 98.6 F (37 C)  SpO2: 98%     FHT:145 bpm Lab orders placed from triage:   None

## 2022-04-18 NOTE — Discharge Instructions (Signed)
Things to Try After 37 weeks to Encourage Labor/Get Ready for Labor:    Try the Miles Circuit at www.milescircuit.com daily to improve baby's position and encourage the onset of labor.  Walk a little and rest a little every day.  Change positions often.  Cervical Ripening: May try one or both Red Raspberry Leaf capsules or tea:  two 300mg or 400mg tablets with each meal, 2-3 times a day, or 1-3 cups of tea daily  Potential Side Effects Of Raspberry Leaf:  Most women do not experience any side effects from drinking raspberry leaf tea. However, nausea and loose stools are possible   Evening Primrose Oil capsules: take 1 capsule by mouth and place one capsule in the vagina every night.    Some of the potential side effects:  Upset stomach  Loose stools or diarrhea  Headaches  Nausea  Sex can also help the cervix ripen and encourage labor onset.   

## 2022-04-19 ENCOUNTER — Inpatient Hospital Stay (HOSPITAL_COMMUNITY): Payer: Medicaid Other | Admitting: Anesthesiology

## 2022-04-19 ENCOUNTER — Inpatient Hospital Stay (HOSPITAL_COMMUNITY)
Admission: AD | Admit: 2022-04-19 | Discharge: 2022-04-21 | DRG: 807 | Disposition: A | Discharge status: Home / Self Care | Payer: Medicaid Other | Attending: Obstetrics and Gynecology | Admitting: Obstetrics and Gynecology

## 2022-04-19 ENCOUNTER — Encounter (HOSPITAL_COMMUNITY)
Admission: AD | Disposition: A | Discharge status: Home / Self Care | Payer: Self-pay | Source: Home / Self Care | Attending: Obstetrics and Gynecology

## 2022-04-19 ENCOUNTER — Inpatient Hospital Stay (HOSPITAL_COMMUNITY): Payer: Medicaid Other

## 2022-04-19 ENCOUNTER — Encounter (HOSPITAL_COMMUNITY): Payer: Self-pay | Admitting: Obstetrics and Gynecology

## 2022-04-19 DIAGNOSIS — Z3A39 39 weeks gestation of pregnancy: Secondary | ICD-10-CM

## 2022-04-19 DIAGNOSIS — Z880 Allergy status to penicillin: Secondary | ICD-10-CM

## 2022-04-19 DIAGNOSIS — Z6791 Unspecified blood type, Rh negative: Secondary | ICD-10-CM | POA: Diagnosis not present

## 2022-04-19 DIAGNOSIS — Z87891 Personal history of nicotine dependence: Secondary | ICD-10-CM

## 2022-04-19 DIAGNOSIS — O9832 Other infections with a predominantly sexual mode of transmission complicating childbirth: Secondary | ICD-10-CM | POA: Diagnosis present

## 2022-04-19 DIAGNOSIS — A6000 Herpesviral infection of urogenital system, unspecified: Secondary | ICD-10-CM | POA: Diagnosis present

## 2022-04-19 DIAGNOSIS — O26893 Other specified pregnancy related conditions, third trimester: Principal | ICD-10-CM | POA: Diagnosis present

## 2022-04-19 DIAGNOSIS — Z23 Encounter for immunization: Secondary | ICD-10-CM

## 2022-04-19 DIAGNOSIS — O99824 Streptococcus B carrier state complicating childbirth: Secondary | ICD-10-CM | POA: Diagnosis present

## 2022-04-19 LAB — TYPE AND SCREEN
ABO/RH(D): O NEG
Antibody Screen: NEGATIVE

## 2022-04-19 LAB — CBC
HCT: 38.2 % (ref 36.0–46.0)
Hemoglobin: 12.6 g/dL (ref 12.0–15.0)
MCH: 29.6 pg (ref 26.0–34.0)
MCHC: 33 g/dL (ref 30.0–36.0)
MCV: 89.7 fL (ref 80.0–100.0)
Platelets: 258 10*3/uL (ref 150–400)
RBC: 4.26 MIL/uL (ref 3.87–5.11)
RDW: 14.9 % (ref 11.5–15.5)
WBC: 10.7 10*3/uL — ABNORMAL HIGH (ref 4.0–10.5)
nRBC: 0 % (ref 0.0–0.2)

## 2022-04-19 LAB — RPR: RPR Ser Ql: NONREACTIVE

## 2022-04-19 SURGERY — LIGATION, FALLOPIAN TUBE, POSTPARTUM
Anesthesia: Epidural

## 2022-04-19 MED ORDER — CLINDAMYCIN PHOSPHATE 900 MG/50ML IV SOLN
900.0000 mg | Freq: Once | INTRAVENOUS | Status: AC
Start: 2022-04-19 — End: 2022-04-19
  Administered 2022-04-19: 900 mg via INTRAVENOUS
  Filled 2022-04-19: qty 50

## 2022-04-19 MED ORDER — SIMETHICONE 80 MG PO CHEW: 80.0000 mg | CHEWABLE_TABLET | ORAL | Status: DC | PRN

## 2022-04-19 MED ORDER — OXYTOCIN BOLUS FROM INFUSION
333.0000 mL | Freq: Once | INTRAVENOUS | Status: AC
Start: 2022-04-19 — End: 2022-04-19
  Administered 2022-04-19: 333 mL via INTRAVENOUS

## 2022-04-19 MED ORDER — LACTATED RINGERS IV SOLN
INTRAVENOUS | Status: DC
Start: 2022-04-19 — End: 2022-04-19

## 2022-04-19 MED ORDER — ACETAMINOPHEN 325 MG PO TABS: 650.0000 mg | ORAL_TABLET | ORAL | Status: DC | PRN

## 2022-04-19 MED ORDER — METOCLOPRAMIDE HCL 10 MG PO TABS
10.0000 mg | ORAL_TABLET | Freq: Once | ORAL | Status: AC
  Administered 2022-04-19: 10 mg via ORAL
  Filled 2022-04-19: qty 1

## 2022-04-19 MED ORDER — PHENYLEPHRINE 80 MCG/ML (10ML) SYRINGE FOR IV PUSH (FOR BLOOD PRESSURE SUPPORT)
80.0000 ug | PREFILLED_SYRINGE | INTRAVENOUS | Status: DC | PRN
  Filled 2022-04-19: qty 10

## 2022-04-19 MED ORDER — EPHEDRINE 5 MG/ML INJ: 10.0000 mg | INTRAVENOUS | Status: DC | PRN

## 2022-04-19 MED ORDER — BENZOCAINE-MENTHOL 20-0.5 % EX AERO: 1.0000 | INHALATION_SPRAY | CUTANEOUS | Status: DC | PRN

## 2022-04-19 MED ORDER — LACTATED RINGERS IV SOLN: 500.0000 mL | INTRAVENOUS | Status: DC | PRN

## 2022-04-19 MED ORDER — LIDOCAINE-EPINEPHRINE (PF) 1.5 %-1:200000 IJ SOLN
INTRAMUSCULAR | Status: DC | PRN
  Administered 2022-04-19: 2 mL via EPIDURAL
  Administered 2022-04-19: 3 mL via EPIDURAL

## 2022-04-19 MED ORDER — OXYTOCIN-SODIUM CHLORIDE 30-0.9 UT/500ML-% IV SOLN
2.5000 [IU]/h | INTRAVENOUS | Status: DC
  Administered 2022-04-19: 2.5 [IU]/h via INTRAVENOUS

## 2022-04-19 MED ORDER — DIBUCAINE (PERIANAL) 1 % EX OINT: 1.0000 | TOPICAL_OINTMENT | CUTANEOUS | Status: DC | PRN

## 2022-04-19 MED ORDER — ONDANSETRON HCL 4 MG PO TABS: 4.0000 mg | ORAL_TABLET | ORAL | Status: DC | PRN

## 2022-04-19 MED ORDER — ONDANSETRON HCL 4 MG/2ML IJ SOLN: 4.0000 mg | INTRAMUSCULAR | Status: DC | PRN

## 2022-04-19 MED ORDER — COCONUT OIL OIL: 1.0000 | TOPICAL_OIL | Status: DC | PRN

## 2022-04-19 MED ORDER — PRENATAL MULTIVITAMIN CH
1.0000 | ORAL_TABLET | Freq: Every day | ORAL | Status: DC
  Administered 2022-04-19 – 2022-04-20 (×2): 1 via ORAL
  Filled 2022-04-19 (×2): qty 1

## 2022-04-19 MED ORDER — OXYCODONE-ACETAMINOPHEN 5-325 MG PO TABS: 2.0000 | ORAL_TABLET | ORAL | Status: DC | PRN

## 2022-04-19 MED ORDER — LIDOCAINE HCL (PF) 1 % IJ SOLN: 30.0000 mL | INTRAMUSCULAR | Status: DC | PRN

## 2022-04-19 MED ORDER — ONDANSETRON HCL 4 MG/2ML IJ SOLN: 4.0000 mg | Freq: Four times a day (QID) | INTRAMUSCULAR | Status: DC | PRN

## 2022-04-19 MED ORDER — LACTATED RINGERS IV SOLN
500.0000 mL | Freq: Once | INTRAVENOUS | Status: AC
  Administered 2022-04-19: 500 mL via INTRAVENOUS

## 2022-04-19 MED ORDER — ACETAMINOPHEN 325 MG PO TABS
650.0000 mg | ORAL_TABLET | ORAL | Status: DC | PRN
  Administered 2022-04-19: 650 mg via ORAL
  Filled 2022-04-19: qty 2

## 2022-04-19 MED ORDER — SENNOSIDES-DOCUSATE SODIUM 8.6-50 MG PO TABS
2.0000 | ORAL_TABLET | ORAL | Status: DC
Start: 2022-04-19 — End: 2022-04-21
  Administered 2022-04-19 – 2022-04-21 (×2): 2 via ORAL
  Filled 2022-04-19 (×2): qty 2

## 2022-04-19 MED ORDER — ZOLPIDEM TARTRATE 5 MG PO TABS: 5.0000 mg | ORAL_TABLET | Freq: Every evening | ORAL | Status: DC | PRN

## 2022-04-19 MED ORDER — TETANUS-DIPHTH-ACELL PERTUSSIS 5-2.5-18.5 LF-MCG/0.5 IM SUSY: 0.5000 mL | PREFILLED_SYRINGE | Freq: Once | INTRAMUSCULAR | Status: DC

## 2022-04-19 MED ORDER — RHO D IMMUNE GLOBULIN 1500 UNIT/2ML IJ SOSY
300.0000 ug | PREFILLED_SYRINGE | Freq: Once | INTRAMUSCULAR | Status: AC
Start: 2022-04-20 — End: 2022-04-20
  Administered 2022-04-20: 300 ug via INTRAVENOUS
  Filled 2022-04-19: qty 2

## 2022-04-19 MED ORDER — PHENYLEPHRINE 80 MCG/ML (10ML) SYRINGE FOR IV PUSH (FOR BLOOD PRESSURE SUPPORT): 80.0000 ug | PREFILLED_SYRINGE | INTRAVENOUS | Status: DC | PRN

## 2022-04-19 MED ORDER — SOD CITRATE-CITRIC ACID 500-334 MG/5ML PO SOLN: 30.0000 mL | ORAL | Status: DC | PRN

## 2022-04-19 MED ORDER — FAMOTIDINE 20 MG PO TABS
40.0000 mg | ORAL_TABLET | Freq: Once | ORAL | Status: AC
  Administered 2022-04-19: 40 mg via ORAL
  Filled 2022-04-19: qty 2

## 2022-04-19 MED ORDER — MEASLES, MUMPS & RUBELLA VAC IJ SOLR
0.5000 mL | Freq: Once | INTRAMUSCULAR | Status: AC
Start: 2022-04-20 — End: 2022-04-20
  Administered 2022-04-20: 0.5 mL via SUBCUTANEOUS
  Filled 2022-04-19: qty 0.5

## 2022-04-19 MED ORDER — OXYCODONE-ACETAMINOPHEN 5-325 MG PO TABS: 1.0000 | ORAL_TABLET | ORAL | Status: DC | PRN

## 2022-04-19 MED ORDER — DIPHENHYDRAMINE HCL 50 MG/ML IJ SOLN: 12.5000 mg | INTRAMUSCULAR | Status: DC | PRN

## 2022-04-19 MED ORDER — OXYTOCIN-SODIUM CHLORIDE 30-0.9 UT/500ML-% IV SOLN
INTRAVENOUS | Status: AC
  Filled 2022-04-19: qty 500

## 2022-04-19 MED ORDER — WITCH HAZEL-GLYCERIN EX PADS: 1.0000 | MEDICATED_PAD | CUTANEOUS | Status: DC | PRN

## 2022-04-19 MED ORDER — LIDOCAINE-EPINEPHRINE (PF) 1.5 %-1:200000 IJ SOLN: INTRAMUSCULAR | Status: DC | PRN

## 2022-04-19 MED ORDER — FENTANYL-BUPIVACAINE-NACL 0.5-0.125-0.9 MG/250ML-% EP SOLN
12.0000 mL/h | EPIDURAL | Status: DC | PRN
  Administered 2022-04-19: 12 mL/h via EPIDURAL
  Filled 2022-04-19: qty 250

## 2022-04-19 MED ORDER — DIPHENHYDRAMINE HCL 25 MG PO CAPS: 25.0000 mg | ORAL_CAPSULE | Freq: Four times a day (QID) | ORAL | Status: DC | PRN

## 2022-04-19 MED ORDER — IBUPROFEN 600 MG PO TABS
600.0000 mg | ORAL_TABLET | Freq: Four times a day (QID) | ORAL | Status: DC
  Administered 2022-04-19 – 2022-04-21 (×8): 600 mg via ORAL
  Filled 2022-04-19 (×8): qty 1

## 2022-04-19 MED ORDER — LACTATED RINGERS IV SOLN: INTRAVENOUS | Status: DC

## 2022-04-19 NOTE — Progress Notes (Signed)
Notified patient she is on the OR schedule for 1525.

## 2022-04-19 NOTE — Anesthesia Procedure Notes (Signed)
Epidural Patient location during procedure: OB Start time: 04/19/2022 3:04 AM End time: 04/19/2022 3:26 AM  Staffing Anesthesiologist: Lewie Loron, MD Performed: anesthesiologist   Preanesthetic Checklist Completed: patient identified, IV checked, risks and benefits discussed, monitors and equipment checked, pre-op evaluation and timeout performed  Epidural Patient position: sitting Prep: DuraPrep and site prepped and draped Patient monitoring: heart rate, continuous pulse ox and blood pressure Approach: midline Location: L3-L4 Injection technique: LOR air and LOR saline  Needle:  Needle type: Tuohy  Needle gauge: 17 G Needle length: 9 cm Needle insertion depth: 9 cm Catheter type: closed end flexible Catheter size: 19 Gauge Catheter at skin depth: 15 cm Test dose: negative  Assessment Sensory level: T8 Events: blood not aspirated, injection not painful, no injection resistance, no paresthesia and negative IV test  Additional Notes Reason for block:procedure for pain

## 2022-04-19 NOTE — Anesthesia Postprocedure Evaluation (Signed)
Anesthesia Post Note  Patient: Peggy Mason  Procedure(s) Performed: AN AD HOC LABOR EPIDURAL     Patient location during evaluation: Mother Baby Anesthesia Type: Epidural Level of consciousness: awake Pain management: satisfactory to patient Vital Signs Assessment: post-procedure vital signs reviewed and stable Respiratory status: spontaneous breathing Cardiovascular status: stable Anesthetic complications: no   No notable events documented.  Last Vitals:  Vitals:   04/19/22 0800 04/19/22 0911  BP: 117/76 102/72  Pulse: 89 78  Resp: 18 18  Temp: 36.9 C 36.9 C  SpO2: 99% 99%    Last Pain:  Vitals:   04/19/22 1152  TempSrc:   PainSc: 0-No pain   Pain Goal:                   [employer]

## 2022-04-19 NOTE — Progress Notes (Signed)
Epidural intact to back, pt educated about npo status for bilateral tubal ligation this afternoon.

## 2022-04-19 NOTE — Progress Notes (Signed)
Subjective:    Post-epidural and becoming comfortable.  Objective:    VS: BP 110/62   Pulse 85   Temp 98.3 F (36.8 C)   Resp 18   LMP 07/15/2021  FHR : baseline 130 / variability moderate / accelerations present / absent decelerations Toco: contractions every 2-3 minutes  Membranes: intact Dilation: 10 Dilation Complete Date: 04/19/22 Dilation Complete Time: 0343 Effacement (%): 100 Cervical Position: Anterior Station: -1 Presentation: Vertex Exam by:: Lizbeth Bark, CNM   Assessment/Plan:   26 y.o. [phone] [redacted]w[redacted]d Normal labor Desires PP permanent sterilzation  Labor: Progressing normally Fetal Wellbeing:  Category I Pain Control:  Epidural I/D:   GBS pos, Clidamycin 900 mg IV x1 dose recived Anticipated MOD:  NSVD  Roma Schanz DNP, CNM 04/19/2022 3:45 AM

## 2022-04-19 NOTE — H&P (Signed)
OB ADMISSION/ HISTORY & PHYSICAL:  Admission Date: 04/19/2022  2:01 AM  Admit Diagnosis: Normal labor  Peggy Mason is a 26 y.o. female [phone] [redacted]w[redacted]d presenting for labor eval and found to be in active labor. Endorses active FM, denies LOF and vaginal bleeding. Preg complicated by Rh neg (rhogam 10/18/21) and HSV-Valtrex since 36 wks. Ctx began 2 days ago.   History of current pregnancy: [id number]   Patient entered care with [facility] at 11 wks.   EDC 04/21/22 by LMP and congruent w/ 11 wk U/S.   Anatomy scan:  19+5 wks incomplete, follow-up at 23+5 complete w/ posterior placenta.   Antenatal testing: S>D in third trimester Last exam: 33+2 wks vertex/ posterior placenta. AFI 13.9/ EFW 4+8 (73%)  Significant prenatal events:  Patient Active Problem List   Diagnosis Date Noted   Normal labor 04/19/2022   Chronic headache disorder 09/28/2021   Genital herpes simplex 08/20/2018   Susceptible varicella--non-immune 08/15/2015   Rh negative, maternal - received Rhophylac on 08/04/15 due to spotting 08/14/2015   Rubella non-immune status, antepartum 08/14/2015   Allergy to penicillin - rash, hives -- required hospitalization 08/14/2015   Allergy to amoxicillin -- rash, hives 08/14/2015    Prenatal Labs: ABO, Rh: --/--/O NEG (07/07 0215) Antibody: NEG (07/07 0215) Rubella:   non-immune RPR:   NR HBsAg:   NR HIV:   NR GTT: normal 1 hr GBS:   positive GC/CHL: neg/neg Genetics: low-risk Vaccines: Tdap: declined Influenza: declined   OB History  Gravida Para Term Preterm AB Living  4 3 3  0 0 3  SAB IAB Ectopic Multiple Live Births  0 0 0 0 3    # Outcome Date GA Lbr Len/2nd Weight Sex Delivery Anes PTL Lv  4 Current           3 Term 03/12/19 [redacted]w[redacted]d 19:10 / 00:47 3960 g M Vag-Spont None  LIV  2 Term 03/30/16 [redacted]w[redacted]d 10:30 / 00:03 3005 g M Vag-Spont EPI  LIV  1 Term 09/02/12 [redacted]w[redacted]d 14:03 / 01:34 3450 g F Vag-Spont EPI  LIV    Medical / Surgical History: Past medical history:  Past  Medical History:  Diagnosis Date   Acne    Anemia    Headache    Herpes    History of chlamydia infection     Past surgical history:  Past Surgical History:  Procedure Laterality Date   HERNIA REPAIR     umbilical   WISDOM TOOTH EXTRACTION     Family History:  Family History  Problem Relation Age of Onset   Hypertension Mother    Hypertension Father    Asthma Sister    Learning disabilities Sister    Kidney disease Sister        dialysis   Diabetes Paternal Grandmother     Social History:  reports that she has quit smoking. Her smoking use included cigars. She has never used smokeless tobacco. She reports that she does not currently use alcohol. She reports that she does not currently use drugs after having used the following drugs: Marijuana.  Allergies: Amoxicillin and Penicillins   Current Medications at time of admission:  Prior to Admission medications   Medication Sig Start Date End Date Taking? Authorizing Provider  azithromycin (ZITHROMAX) 250 MG tablet TAKE 2 TABLETS BY MOUTH TODAY, THEN TAKE 1 TABLET DAILY FOR 4 DAYS 12/28/21   [provider]  azithromycin (ZITHROMAX) 500 MG tablet Take by mouth daily.    [provider]  Cholecalciferol (VITAMIN D3) 50 MCG (2000 UT) capsule Take 4,000 Units by mouth daily. 10/10/21   [provider]  cyclobenzaprine (FLEXERIL) 5 MG tablet Take 1 tablet (5 mg total) by mouth 2 (two) times daily as needed for muscle spasms. 09/01/21   Gerrit Heck, CNM  Doxylamine-Pyridoxine (DICLEGIS) 10-10 MG TBEC Take 2 tablets by mouth at bedtime. 09/01/21   Gerrit Heck, CNM  famotidine (PEPCID) 40 MG tablet Take 1 tablet (40 mg total) by mouth at bedtime. 08/28/21   Bernerd Limbo, CNM  ferrous sulfate 325 (65 FE) MG tablet Take 325 mg by mouth daily with breakfast.    [provider]  metoCLOPramide (REGLAN) 10 MG tablet Take 1 tablet (10 mg total) by mouth every 6 (six) hours. 09/01/21   Gerrit Heck,  CNM  Prenatal Vit-Fe Fumarate-FA (PRENATAL MULTIVITAMIN) TABS tablet Take 1 tablet by mouth daily at 12 noon.    [provider]  valACYclovir (VALTREX) 500 MG tablet Take 500 mg by mouth daily.    [provider]    Review of Systems: Constitutional: Negative   HENT: Negative   Eyes: Negative   Respiratory: Negative   Cardiovascular: Negative   Gastrointestinal: Negative  Genitourinary: pos for bloody show, neg for LOF   Musculoskeletal: Negative   Skin: Negative   Neurological: Negative   Endo/Heme/Allergies: Negative   Psychiatric/Behavioral: Negative    Physical Exam: VS: Temperature 98.3 F (36.8 C), last menstrual period 07/15/2021, unknown if currently breastfeeding. AAO x3, no signs of distress Cardiovascular: RRR Respiratory: Unlabored GU/GI: Abdomen gravid, non-tender, non-distended, active FM, vertex, Extremities: no edema, negative for pain, tenderness, and cords  Cervical exam: 8/100/0 in MAU FHR: baseline rate 130 / variability moderate / accelerations present / absent decelerations TOCO: 2-3   Prenatal Transfer Tool  Maternal Diabetes: No Genetic Screening: Normal Maternal Ultrasounds/Referrals: Normal Fetal Ultrasounds or other Referrals:  None Maternal Substance Abuse:  No Significant Maternal Medications:  None Significant Maternal Lab Results: Group B Strep positive w/ PVC and Amoxicillin allergy, treating with clindamycin 900    Assessment: 26 y.o. [id number] [redacted]w[redacted]d  Active stage of labor HSV pos    -valtrex since 36 wks, inconsistent    -speculum exam neg    -no prodromal symptoms FHR category 1 GBS pos Pain management plan: desires epidural   Plan:  Admit to L&D Routine admission orders Epidural PRN Clindamycin 900 mg for GBS prophylaxis Expectant management Dr Normand Sloop to be notified of admission and plan of care  Roma Schanz DNP, CNM 04/19/2022 2:16 AM

## 2022-04-19 NOTE — MAU Note (Signed)
..  Peggy Mason is a 26 y.o. at [redacted]w[redacted]d here in MAU reporting: contractions every minute. States last cervical exam she was 4cm. Denies vaginal bleeding or leaking of fluid. +FM.  FHT obtained:136 SVE by Alvino Chapel, RN [phone] This RN called Rhea Pink, CNM at [phone]. Admission orders received. CNM stated she would put in orders.  Labor and delivery charge nurse called at 0211. Report given and room assignment give.  Saline lock placed and labs drawn.  Patient transported to labor and delivery.

## 2022-04-19 NOTE — Progress Notes (Signed)
Pt has h/o umbiical hernia repair in 1996 R/b/a reviewed including postponing tubal until op note can be obtained and doing an interval tubal instead. Questions answered

## 2022-04-19 NOTE — Anesthesia Preprocedure Evaluation (Deleted)
Anesthesia Evaluation  Patient identified by MRN, date of birth, ID band Patient awake    Reviewed: Allergy & Precautions, NPO status , Patient's Chart, lab work & pertinent test results  Airway Mallampati: II  TM Distance: >3 FB Neck ROM: Full    Dental no notable dental hx.    Pulmonary neg pulmonary ROS, former smoker,    Pulmonary exam normal breath sounds clear to auscultation       Cardiovascular negative cardio ROS Normal cardiovascular exam Rhythm:Regular Rate:Normal     Neuro/Psych  Headaches, negative psych ROS   GI/Hepatic negative GI ROS, Neg liver ROS,   Endo/Other  negative endocrine ROS  Renal/GU negative Renal ROS  negative genitourinary   Musculoskeletal negative musculoskeletal ROS (+)   Abdominal   Peds  Hematology negative hematology ROS (+)   Anesthesia Other Findings Presents for PPTL. Epidural still in place and functioned well for labor.   Reproductive/Obstetrics                            Anesthesia Physical Anesthesia Plan  ASA: 2  Anesthesia Plan: Epidural   Post-op Pain Management:    Induction:   PONV Risk Score and Plan: Treatment may vary due to age or medical condition  Airway Management Planned: Natural Airway  Additional Equipment:   Intra-op Plan:   Post-operative Plan:   Informed Consent: I have reviewed the patients History and Physical, chart, labs and discussed the procedure including the risks, benefits and alternatives for the proposed anesthesia with the patient or authorized representative who has indicated his/her understanding and acceptance.       Plan Discussed with: Anesthesiologist  Anesthesia Plan Comments: (Plan to use pre-existing epidural.   Procedure cancelled by OB. )       Anesthesia Quick Evaluation

## 2022-04-19 NOTE — Anesthesia Preprocedure Evaluation (Signed)
Anesthesia Evaluation  Patient identified by MRN, date of birth, ID band Patient awake    Reviewed: Allergy & Precautions, Patient's Chart, lab work & pertinent test results  Airway Mallampati: II  TM Distance: >3 FB Neck ROM: Full    Dental no notable dental hx. (+) Teeth Intact   Pulmonary former smoker,    Pulmonary exam normal breath sounds clear to auscultation       Cardiovascular negative cardio ROS Normal cardiovascular exam Rhythm:Regular Rate:Normal     Neuro/Psych negative neurological ROS  negative psych ROS   GI/Hepatic Neg liver ROS, GERD  ,  Endo/Other    Renal/GU negative Renal ROS  negative genitourinary   Musculoskeletal negative musculoskeletal ROS (+)   Abdominal (+) + obese,   Peds  Hematology  (+) Blood dyscrasia, anemia ,   Anesthesia Other Findings   Reproductive/Obstetrics (+) Pregnancy HSV                             Anesthesia Physical  Anesthesia Plan  ASA: 3  Anesthesia Plan: Epidural   Post-op Pain Management:    Induction:   PONV Risk Score and Plan:   Airway Management Planned: Natural Airway  Additional Equipment:   Intra-op Plan:   Post-operative Plan:   Informed Consent: I have reviewed the patients History and Physical, chart, labs and discussed the procedure including the risks, benefits and alternatives for the proposed anesthesia with the patient or authorized representative who has indicated his/her understanding and acceptance.       Plan Discussed with: Anesthesiologist  Anesthesia Plan Comments:         Anesthesia Quick Evaluation

## 2022-04-19 NOTE — Lactation Note (Signed)
This note was copied from a baby's chart. Lactation Consultation Note  Patient Name: Peggy Mason DCVUD'T Date: 04/19/2022 Reason for consult: Initial assessment Age:27 hours   LC Note:  Mother declined lactation services in labor and delivery.  Per RN, she declined lactation services on the M/B unit also.   Maternal Data    Feeding    LATCH Score Latch: Grasps breast easily, tongue down, lips flanged, rhythmical sucking.  Audible Swallowing: A few with stimulation  Type of Nipple: Everted at rest and after stimulation  Comfort (Breast/Nipple): Soft / non-tender  Hold (Positioning): No assistance needed to correctly position infant at breast.  LATCH Score: 9   Lactation Tools Discussed/Used    Interventions    Discharge    Consult Status Consult Status: Complete    Khamauri Bauernfeind R Gricel Copen 04/19/2022, 11:26 AM

## 2022-04-19 NOTE — Progress Notes (Signed)
Called labor and delivery to have epidural catheter removed.

## 2022-04-20 LAB — CBC
HCT: 27.5 % — ABNORMAL LOW (ref 36.0–46.0)
Hemoglobin: 9.5 g/dL — ABNORMAL LOW (ref 12.0–15.0)
MCH: 30.4 pg (ref 26.0–34.0)
MCHC: 34.5 g/dL (ref 30.0–36.0)
MCV: 87.9 fL (ref 80.0–100.0)
Platelets: 220 10*3/uL (ref 150–400)
RBC: 3.13 MIL/uL — ABNORMAL LOW (ref 3.87–5.11)
RDW: 15.1 % (ref 11.5–15.5)
WBC: 12.1 10*3/uL — ABNORMAL HIGH (ref 4.0–10.5)
nRBC: 0 % (ref 0.0–0.2)

## 2022-04-20 MED ORDER — FERROUS SULFATE 325 (65 FE) MG PO TABS: 325.0000 mg | ORAL_TABLET | Freq: Two times a day (BID) | ORAL | 3 refills | Status: DC

## 2022-04-20 MED ORDER — IBUPROFEN 600 MG PO TABS: 600.0000 mg | ORAL_TABLET | Freq: Four times a day (QID) | ORAL | 0 refills | Status: DC

## 2022-04-20 MED ORDER — DOCUSATE SODIUM 100 MG PO CAPS: 100.0000 mg | ORAL_CAPSULE | Freq: Two times a day (BID) | ORAL | 3 refills | Status: DC

## 2022-04-20 NOTE — Discharge Summary (Addendum)
Postpartum Discharge Summary  Date of Service: 04/21/22     Patient Name: Peggy Mason DOB: 08/05/1996 MRN: [id number]  Date of admission: 04/19/2022 Delivery date:04/19/2022  Delivering provider: Burman Foster B  Date of discharge: 04/21/2022  Admitting diagnosis: Normal labor [O80, Z37.9] Intrauterine pregnancy: [redacted]w[redacted]d     Secondary diagnosis:  Principal Problem:   Normal labor  Additional problems: HSV, Rh negative  Discharge diagnosis: Term Pregnancy Delivered                                              Post partum procedures:rhogam ordered to be given prior to discharge Augmentation: AROM Complications: None  Hospital course: Onset of Labor With Vaginal Delivery      26 y.o. yo [id number] at [redacted]w[redacted]d was admitted in Active Labor on 04/19/2022. Patient had an uncomplicated labor course as follows:  Membrane Rupture Time/Date: 5:51 AM ,04/19/2022   Delivery Method:Vaginal, Spontaneous  Episiotomy: None  Lacerations:  None  Patient had an uncomplicated postpartum course.  She is ambulating, tolerating a regular diet, passing flatus, and urinating well. Patient is discharged home in stable condition on 04/21/22.  Newborn Data: Birth date:04/19/2022  Birth time:6:17 AM  Gender:Female  Living status:Living  Apgars:8 ,9  Weight:3290 g   Magnesium Sulfate received: No BMZ received: No Rhophylac: Ordered to be given prior to discharge MMR:N/A T-DaP: Declined Flu: No Transfusion:No  Physical exam  Vitals:   04/20/22 0500 04/20/22 1422 04/20/22 2136 04/21/22 0607  BP: 108/68 109/67 96/63 102/62  Pulse: 90 98 81 77  Resp: $Remo'17 15 16 18  'twhpy$ Temp: 98.5 F (36.9 C) 97.8 F (36.6 C) 97.8 F (36.6 C) 98.4 F (36.9 C)  TempSrc: Oral Oral Oral Oral  SpO2: 99% 100% 100% 100%  Weight:      Height:       General: alert, cooperative, and no distress Lochia: appropriate Uterine Fundus: firm Incision: N/A DVT Evaluation: No evidence of DVT seen on physical exam. No cords or calf  tenderness. Labs: Lab Results  Component Value Date   WBC 12.1 (H) 04/20/2022   HGB 9.5 (L) 04/20/2022   HCT 27.5 (L) 04/20/2022   MCV 87.9 04/20/2022   PLT 220 04/20/2022      Latest Ref Rng & Units 03/05/2022   11:13 AM  CMP  Glucose 70 - 99 mg/dL 84   BUN 6 - 20 mg/dL 9   Creatinine 0.44 - 1.00 mg/dL 0.68   Sodium 135 - 145 mmol/L 137   Potassium 3.5 - 5.1 mmol/L 3.6   Chloride 98 - 111 mmol/L 108   CO2 22 - 32 mmol/L 21   Calcium 8.9 - 10.3 mg/dL 8.6   Total Protein 6.5 - 8.1 g/dL 6.7   Total Bilirubin 0.3 - 1.2 mg/dL 0.7   Alkaline Phos 38 - 126 U/L 170   AST 15 - 41 U/L 20   ALT 0 - 44 U/L 10    Edinburgh Score:    04/20/2022    5:47 PM  Edinburgh Postnatal Depression Scale Screening Tool  I have been able to laugh and see the funny side of things. 0  I have looked forward with enjoyment to things. 0  I have blamed myself unnecessarily when things went wrong. 1  I have been anxious or worried for no good reason. 0  I have felt scared or panicky for  no good reason. 0  Things have been getting on top of me. 1  I have been so unhappy that I have had difficulty sleeping. 0  I have felt sad or miserable. 1  I have been so unhappy that I have been crying. 0  The thought of harming myself has occurred to me. 0  Edinburgh Postnatal Depression Scale Total 3      After visit meds:  Allergies as of 04/21/2022       Reactions   Amoxicillin Hives   Penicillins Hives   Has patient had a PCN reaction causing immediate rash, facial/tongue/throat swelling, SOB or lightheadedness with hypotension: Yes Has patient had a PCN reaction causing severe rash involving mucus membranes or skin necrosis: No Has patient had a PCN reaction that required hospitalization Yes Has patient had a PCN reaction occurring within the last 10 years: No If all of the above answers are "NO", then may proceed with Cephalosporin use.        Medication List     STOP taking these medications     azithromycin 250 MG tablet Commonly known as: ZITHROMAX   azithromycin 500 MG tablet Commonly known as: ZITHROMAX   cyclobenzaprine 5 MG tablet Commonly known as: FLEXERIL   Doxylamine-Pyridoxine 10-10 MG Tbec Commonly known as: Diclegis   famotidine 40 MG tablet Commonly known as: Pepcid   metoCLOPramide 10 MG tablet Commonly known as: REGLAN       TAKE these medications    docusate sodium 100 MG capsule Commonly known as: Colace Take 1 capsule (100 mg total) by mouth 2 (two) times daily.   ferrous sulfate 325 (65 FE) MG tablet Take 325 mg by mouth daily with breakfast. What changed: Another medication with the same name was added. Make sure you understand how and when to take each.   ferrous sulfate 325 (65 FE) MG tablet Take 1 tablet (325 mg total) by mouth 2 (two) times daily with a meal. What changed: You were already taking a medication with the same name, and this prescription was added. Make sure you understand how and when to take each.   ibuprofen 600 MG tablet Commonly known as: ADVIL Take 1 tablet (600 mg total) by mouth every 6 (six) hours.   prenatal multivitamin Tabs tablet Take 1 tablet by mouth daily at 12 noon.   valACYclovir 500 MG tablet Commonly known as: VALTREX Take 500 mg by mouth daily.   Vitamin D3 50 MCG (2000 UT) capsule Take 4,000 Units by mouth daily.         Discharge home in stable condition Infant Feeding: Breast Infant Disposition:home with mother Discharge instruction: per After Visit Summary and Postpartum booklet. Activity: Advance as tolerated. Pelvic rest for 6 weeks.  Diet: routine diet Anticipated Birth Control:  Interval bilateral tubal sterilization  Postpartum Appointment:6 weeks Additional Postpartum F/U:  None Future Appointments:No future appointments. Follow up Visit:  Follow-up Information     Ob/Gyn, [place] Follow up in 6 week(s).   Specialty: Obstetrics and Gynecology Why: Please call  [facility] to schedule your 6 week postpartum visit. Contact information: [address] [place] [address] [phone]                     04/21/2022 Drema Dallas, DO

## 2022-04-20 NOTE — Lactation Note (Signed)
This note was copied from a baby's chart. Lactation Consultation Note  Patient Name: Peggy Mason IRCVE'L Date: 04/20/2022 Reason for consult: Initial assessment;Nipple pain/trauma;Mother's request;RN request;Term Age:28 hours  Initial consult for pt. Request and RN request.  Mom had declined [facility] consult until today.  Mom has concerns about baby not getting enough.   Pain is great on the right nipple, blister noted,   left nipple is less painful.  Mom states baby falls asleep shortly after latching.  Nipple is compressed after feeding per mom.   LC reviewed hand expression.  Mom tried suggested way and several drops flowed easily.  BAby undressed and placed STS.  When crying, tongue divots and is anchored down in front center, sides curl and raise but center of tongue is flat.  Thin, tight frenulum easily felt and noted.  Suck is strong, limited elevation felt on suck assessment.    LC assisted with deep latch in football position, cont. Sucking continuous without jaw excursion or swallows until breast compressions used then swallows were easily heard.  After BF, nipple was rounded, baby was asleep.  Mom states this is the first time she has slept after a feeding.    LC suggested STS, feed with cues, use waking techniques, listen for swallows.  Hand pump for removing milk on the right side and encouraged hand expression.    Mom was encouraged to have her Ped. Dentist assess baby if pain continues and baby is not satisfied after long nursing sessions.  Encouraged OP LC support at [facility] or [facility].    Maternal Data Has patient been taught Hand Expression?: Yes Does the patient have breastfeeding experience prior to this delivery?: Yes How long did the patient breastfeed?: 1 year with 2 of her children and and a few months with the other child.  Feeding Mother's Current Feeding Choice: Breast Milk  LATCH Score Latch: Grasps breast easily, tongue down, lips flanged,  rhythmical sucking.  Audible Swallowing: A few with stimulation (swallows heard only with compressions and with stimulation to baby)  Type of Nipple: Everted at rest and after stimulation  Comfort (Breast/Nipple): Engorged, cracked, bleeding, large blisters, severe discomfort (severe pain on the right from previous feeds.  L nipple is pain of 6 with latch)  Hold (Positioning): Assistance needed to correctly position infant at breast and maintain latch.  LATCH Score: 6   Lactation Tools Discussed/Used Tools: Pump Breast pump type: Manual Pump Education: Setup, frequency, and cleaning;Milk Storage Reason for Pumping: express and feed back any milk to baby, pump right breast since mom is so sore and only latching on left  Interventions Interventions: Education;LC Services brochure;Support pillows;Adjust position;Breast compression;Skin to skin;Breast massage;Hand express;Breast feeding basics reviewed;Assisted with latch  Discharge Pump: Manual  Consult Status Consult Status: Follow-up Date: 04/21/22 Follow-up type: In-patient    Maryruth Hancock [facility] 04/20/2022, 4:30 PM

## 2022-04-20 NOTE — Progress Notes (Signed)
Postpartum Note Day #1  S:  Patient doing well.  Pain controlled.  Tolerating regular diet.   Ambulating and voiding without difficulty. Desires discharge home today if infant is discharged. Infant is cluster feeding - desires to discuss with lactation. Denies fevers, chills, chest pain, SOB, N/V, or worsening bilateral LE edema.  Lochia: Minimal Infant feeding:  Breast Circumcision:  N/A, female infant Contraception:  Interval bilateral tubal sterilization  O: Temp:  [98.2 F (36.8 C)-98.6 F (37 C)] 98.5 F (36.9 C) (07/08 0500) Pulse Rate:  [80-109] 90 (07/08 0500) Resp:  [16-18] 17 (07/08 0500) BP: (100-116)/(66-82) 108/68 (07/08 0500) SpO2:  [99 %-100 %] 99 % (07/08 0500) Gen: NAD, pleasant and cooperative Resp: No increased work of breathing Abdomen: soft, non-distended, non-tender throughout Uterus: firm, non-tender, below umbilicus Ext: No bilateral LE edema, no bilateral calf tenderness  Labs:  Recent Labs    04/19/22 0215 04/20/22 0528  HGB 12.6 9.5*  HCT 38.2 27.5*    A/P: Patient is a 26 y.o. [id number] PPD#1 s/p SVD.  - Pain well controlled  - GU: UOP is adequate - GI: Tolerating regular diet - Activity: encouraged sitting up to chair and ambulation as tolerated - DVT Prophylaxis: Ambulation - Labs: as above - oral iron ordered - Lactation consult placed  Disposition:  D/C home today if infant discharged. If infant is not discharged, D/C home tomorrow.  Steva Ready, DO [phone] (office)

## 2022-04-21 LAB — RH IG WORKUP (INCLUDES ABO/RH)
Fetal Screen: NEGATIVE
Gestational Age(Wks): 39.5
Unit division: 0

## 2022-04-21 NOTE — Lactation Note (Signed)
This note was copied from a baby's chart. Lactation Consultation Note  Patient Name: Peggy Mason ENMMH'W Date: 04/21/2022 Reason for consult: Follow-up assessment;Difficult latch;Nipple pain/trauma;Term Age:28 hours  LC in to visit with [name] of term baby on day of discharge.  Baby is at a 6% weight loss.  Mom has been exclusively breastfeeding baby on her left breast due to nipple trauma and pain on right breast.    Baby latched to breast on left side while Mom was hand pumping on right breast when LC came in room.  Baby noted to be sucking with non-nutritive sucking.  Recommended Mom support her breast during feedings and flange baby's upper lip when she is latched.  Showed Mom how to compress the breast gently with sucking bursts, occasional swallow suck noted.  When baby came off the breast, nipple slightly pinched with some blistering on tip.    On oral assessment, baby has a significant anterior lingual short frenulum, and labial frenulum as well.  Baby's tongue remains rooted at floor of mouth when crying.  Baby's tongue cupping finger on digital suck assessment, but loses suction intermittently.  Mom aware of importance of OP lactation F/U.  Mom has a DEBP at home and Mom instructed to use it after breastfeeding baby on the less painful side.  Mom to pump both breasts for 15-25 mins and offer baby 30 ml increasing as tolerated of EBM by paced bottle (teaching done).  Engorgement prevention and treatment reviewed. Mom encouraged to call prn for concerns.  Feeding Nipple Type: Slow - flow  LATCH Score Latch: Grasps breast easily, tongue down, lips flanged, rhythmical sucking.  Audible Swallowing: A few with stimulation  Type of Nipple: Everted at rest and after stimulation  Comfort (Breast/Nipple): Filling, red/small blisters or bruises, mild/mod discomfort  Hold (Positioning): Assistance needed to correctly position infant at breast and maintain latch.  LATCH Score:  7   Lactation Tools Discussed/Used Tools: Pump;Flanges;Shells Flange Size: 24 Breast pump type: Double-Electric Breast Pump Pump Education: Setup, frequency, and cleaning;Milk Storage Reason for Pumping: Difficult latch on right side/;sore nipples/tongue restriction Pumping frequency: With each feeding Pumped volume: 21 mL  Interventions Interventions: Breast feeding basics reviewed;Assisted with latch;Skin to skin;Breast massage;Hand express;Breast compression;Adjust position;Support pillows;Position options;Shells;Hand pump;Pace feeding  Discharge Discharge Education: Engorgement and breast care;Warning signs for feeding baby;Outpatient recommendation;Outpatient Epic message sent Pump: Personal [facility] Doy Mince DEBP)  Consult Status Consult Status: Complete Date: 04/21/22 Follow-up type: Out-patient    Judee Clara 04/21/2022, 9:41 AM

## 2022-04-27 ENCOUNTER — Telehealth (HOSPITAL_COMMUNITY): Payer: Self-pay

## 2022-04-27 NOTE — Telephone Encounter (Signed)
Patient reports she is doing pretty good. Patient states that she has been having some pelvic pain. RN encouraged patient to reach out to her OB-GYN about her concern. RN explained uterine involution and postpartum cramping. Patient declines and other questions/concerns about her health and healing.  Patient reports that baby is doing well. Eating, peeing/pooping, and gaining weight well. Patient states that baby has some blood at umbilical cord stump from diaper pulling at cord. RN reviewed cord care with patient. RN told patient to keep a close eye on cord and if she sees active bleeding or any signs of infection to contact her pediatrician. Baby sleeps in a bassinet. RN reviewed ABC's of safe sleep with patient. Patient declines any questions or concerns about baby.  EPDS score is 4.  Marcelino Duster [facility]  04/27/22,1242

## 2022-12-18 ENCOUNTER — Encounter (HOSPITAL_COMMUNITY): Payer: Self-pay | Admitting: *Deleted

## 2022-12-18 ENCOUNTER — Inpatient Hospital Stay (HOSPITAL_COMMUNITY)
Admission: AD | Admit: 2022-12-18 | Discharge: 2022-12-18 | Disposition: A | Discharge status: Home / Self Care | Payer: Medicaid Other | Attending: Family Medicine | Admitting: Family Medicine

## 2022-12-18 ENCOUNTER — Other Ambulatory Visit: Payer: Self-pay

## 2022-12-18 ENCOUNTER — Inpatient Hospital Stay (HOSPITAL_COMMUNITY): Payer: Medicaid Other

## 2022-12-18 DIAGNOSIS — R109 Unspecified abdominal pain: Secondary | ICD-10-CM | POA: Insufficient documentation

## 2022-12-18 DIAGNOSIS — O26899 Other specified pregnancy related conditions, unspecified trimester: Secondary | ICD-10-CM

## 2022-12-18 DIAGNOSIS — Z3A01 Less than 8 weeks gestation of pregnancy: Secondary | ICD-10-CM | POA: Diagnosis not present

## 2022-12-18 DIAGNOSIS — O26891 Other specified pregnancy related conditions, first trimester: Secondary | ICD-10-CM | POA: Insufficient documentation

## 2022-12-18 DIAGNOSIS — O98511 Other viral diseases complicating pregnancy, first trimester: Secondary | ICD-10-CM | POA: Insufficient documentation

## 2022-12-18 DIAGNOSIS — O034 Incomplete spontaneous abortion without complication: Secondary | ICD-10-CM | POA: Diagnosis not present

## 2022-12-18 DIAGNOSIS — Z6791 Unspecified blood type, Rh negative: Secondary | ICD-10-CM

## 2022-12-18 DIAGNOSIS — U071 COVID-19: Secondary | ICD-10-CM | POA: Diagnosis not present

## 2022-12-18 LAB — HCG, QUANTITATIVE, PREGNANCY: hCG, Beta Chain, Quant, S: 63179 m[IU]/mL — ABNORMAL HIGH (ref <5)

## 2022-12-18 LAB — COMPREHENSIVE METABOLIC PANEL
ALT: 35 U/L (ref 0–44)
AST: 33 U/L (ref 15–41)
Albumin: 3.2 g/dL — ABNORMAL LOW (ref 3.5–5.0)
Alkaline Phosphatase: 118 U/L (ref 38–126)
Anion gap: 11 (ref 5–15)
BUN: 9 mg/dL (ref 6–20)
CO2: 21 mmol/L — ABNORMAL LOW (ref 22–32)
Calcium: 8.7 mg/dL — ABNORMAL LOW (ref 8.9–10.3)
Chloride: 105 mmol/L (ref 98–111)
Creatinine, Ser: 0.55 mg/dL (ref 0.44–1.00)
GFR, Estimated: >60 mL/min (ref >60)
Glucose, Bld: 89 mg/dL (ref 70–99)
Potassium: 3.8 mmol/L (ref 3.5–5.1)
Sodium: 137 mmol/L (ref 135–145)
Total Bilirubin: 0.5 mg/dL (ref 0.3–1.2)
Total Protein: 6.9 g/dL (ref 6.5–8.1)

## 2022-12-18 LAB — CBC
HCT: 34.3 % — ABNORMAL LOW (ref 36.0–46.0)
Hemoglobin: 11.2 g/dL — ABNORMAL LOW (ref 12.0–15.0)
MCH: 28.2 pg (ref 26.0–34.0)
MCHC: 32.7 g/dL (ref 30.0–36.0)
MCV: 86.4 fL (ref 80.0–100.0)
Platelets: 383 10*3/uL (ref 150–400)
RBC: 3.97 MIL/uL (ref 3.87–5.11)
RDW: 13.7 % (ref 11.5–15.5)
WBC: 9.2 10*3/uL (ref 4.0–10.5)
nRBC: 0 % (ref 0.0–0.2)

## 2022-12-18 LAB — RESP PANEL BY RT-PCR (RSV, FLU A&B, COVID)  RVPGX2
Influenza A by PCR: NEGATIVE
Influenza B by PCR: NEGATIVE
Resp Syncytial Virus by PCR: NEGATIVE
SARS Coronavirus 2 by RT PCR: POSITIVE — AB

## 2022-12-18 LAB — URINALYSIS, ROUTINE W REFLEX MICROSCOPIC
Bilirubin Urine: NEGATIVE
Glucose, UA: NEGATIVE mg/dL
Hgb urine dipstick: NEGATIVE
Ketones, ur: NEGATIVE mg/dL
Leukocytes,Ua: NEGATIVE
Nitrite: NEGATIVE
Protein, ur: NEGATIVE mg/dL
Specific Gravity, Urine: 1.021 (ref 1.005–1.030)
pH: 7 (ref 5.0–8.0)

## 2022-12-18 LAB — ABO/RH
ABO/RH(D): O NEG
Antibody Screen: NEGATIVE

## 2022-12-18 LAB — POCT PREGNANCY, URINE: Preg Test, Ur: POSITIVE — AB

## 2022-12-18 MED ORDER — LACTATED RINGERS IV BOLUS
1000.0000 mL | Freq: Once | INTRAVENOUS | Status: AC
  Administered 2022-12-18: 1000 mL via INTRAVENOUS

## 2022-12-18 MED ORDER — ONDANSETRON HCL 4 MG/2ML IJ SOLN
4.0000 mg | Freq: Once | INTRAMUSCULAR | Status: AC
  Administered 2022-12-18: 4 mg via INTRAVENOUS
  Filled 2022-12-18: qty 2

## 2022-12-18 MED ORDER — RHO D IMMUNE GLOBULIN 1500 UNIT/2ML IJ SOSY
300.0000 ug | PREFILLED_SYRINGE | Freq: Once | INTRAMUSCULAR | Status: AC
Start: 2022-12-18 — End: 2022-12-18
  Administered 2022-12-18: 300 ug via INTRAVENOUS
  Filled 2022-12-18: qty 2

## 2022-12-18 MED ORDER — KETOROLAC TROMETHAMINE 30 MG/ML IJ SOLN
30.0000 mg | Freq: Once | INTRAMUSCULAR | Status: AC
  Administered 2022-12-18: 30 mg via INTRAVENOUS
  Filled 2022-12-18: qty 1

## 2022-12-18 NOTE — MAU Note (Signed)
Peggy Mason is a 27 y.o. at Unknown here in MAU reporting: she went to [facility] 1 week ago, took abortion pill.  States she thinks she's passed the pregnancy, reports has been bleeding and passing clots.  Reports currently has abdominal pain, reports feels like stomach is on fire .  Also reporting N/V since last Wednesday, states unable to keep anything down. LMP: 10/26/2022 Onset of complaint: last week Pain score: 10 Vitals:   12/18/22 1203  BP: 137/78  Pulse: 82  Resp: 18  Temp: 98.7 F (37.1 C)  SpO2: 99%     FHT:NA Lab orders placed from triage:   UPT & UA

## 2022-12-18 NOTE — Discharge Instructions (Signed)
You were seen in the MAU for abdominal pain. We did an ultrasound that showed you are still pregnant with a nonviable pregnancy. We discussed options and you elected to have a D&C procedure. Our office will call you soon to schedule this procedure.   Return to the MAU if you have severe abdominal pain, heavy vaginal bleeding soaking >1 pad/hour, or fever.

## 2022-12-18 NOTE — MAU Provider Note (Signed)
History     [id number]  Arrival date and time: 12/18/22 1043    Chief Complaint  Patient presents with   Abdominal Pain   Nausea   Emesis     HPI Peggy Mason is a 27 y.o. at 75w4dby outside dating with PMHx notable for 4 prior NSVD, who presents for nausea, vomiting, and multiple other symptoms.   Patient reports [facility] and had a medication abortion that was uncomplicated She reports feeling sick right before going to that appointment Feels like she passed the pregnancy but her abdominal cramps have gotten progessively worse over this time Bleeding was initially heavy but has lightened significantly, only having some blood when wiping after using the bathroom Kids have been sick recently but not currently She denies runny nose, cough, chest pain, or shortness of breath Endorses significant n/v such that she can't keep down liquids or solids Denies diarrhea No burning or pain with urination No vaginal discharge Reports she had a fever approximately 6 days ago with max temp of 101F but has not had a fever since then  --/--/O NEG (03/06 1255)  OB History     Gravida  5   Para  4   Term  4   Preterm  0   AB  0   Living  4      SAB  0   IAB  0   Ectopic  0   Multiple  0   Live Births  4           Past Medical History:  Diagnosis Date   Acne    Anemia    Headache    Herpes    History of chlamydia infection     Past Surgical History:  Procedure Laterality Date   HERNIA REPAIR     umbilical   WISDOM TOOTH EXTRACTION      Family History  Problem Relation Age of Onset   Hypertension Mother    Hypertension Father    Asthma Sister    Learning disabilities Sister    Kidney disease Sister        dialysis   Diabetes Paternal Grandmother     Social History   Socioeconomic History   Marital status: Single    Spouse name: Not on file   Number of children: Not on file   Years of education: Not on file    Highest education level: Not on file  Occupational History   Not on file  Tobacco Use   Smoking status: Former    Types: Cigars   Smokeless tobacco: Never   Tobacco comments:    not smoked since pregnant, passive smoker  Vaping Use   Vaping Use: Former  Substance and Sexual Activity   Alcohol use: Not Currently   Drug use: Not Currently    Types: Marijuana, Other-see comments    Comment: last smoked 10/2022, eats edibles   Sexual activity: Not Currently    Birth control/protection: None  Other Topics Concern   Not on file  Social History Narrative   Not on file   Social Determinants of Health   Financial Resource Strain: Not on file  Food Insecurity: Not on file  Transportation Needs: Not on file  Physical Activity: Not on file  Stress: Not on file  Social Connections: Not on file  Intimate Partner Violence: Not on file    Allergies  Allergen Reactions   Amoxicillin Hives   Penicillins  Hives    Has patient had a PCN reaction causing immediate rash, facial/tongue/throat swelling, SOB or lightheadedness with hypotension: Yes Has patient had a PCN reaction causing severe rash involving mucus membranes or skin necrosis: No Has patient had a PCN reaction that required hospitalization Yes Has patient had a PCN reaction occurring within the last 10 years: No If all of the above answers are "NO", then may proceed with Cephalosporin use.    No current facility-administered medications on file prior to encounter.   Current Outpatient Medications on File Prior to Encounter  Medication Sig Dispense Refill   acetaminophen (TYLENOL) 500 MG tablet Take 500 mg by mouth every 4 (four) hours as needed for mild pain.     Prenatal Vit-Fe Fumarate-FA (PRENATAL MULTIVITAMIN) TABS tablet Take 1 tablet by mouth daily at 12 noon.     Cholecalciferol (VITAMIN D3) 50 MCG (2000 UT) capsule Take 4,000 Units by mouth daily.     docusate sodium (COLACE) 100 MG capsule Take 1 capsule (100 mg  total) by mouth 2 (two) times daily. 60 capsule 3   ferrous sulfate 325 (65 FE) MG tablet Take 325 mg by mouth daily with breakfast.     ferrous sulfate 325 (65 FE) MG tablet Take 1 tablet (325 mg total) by mouth 2 (two) times daily with a meal. 60 tablet 3   ibuprofen (ADVIL) 600 MG tablet Take 1 tablet (600 mg total) by mouth every 6 (six) hours. 30 tablet 0   valACYclovir (VALTREX) 500 MG tablet Take 500 mg by mouth daily.       ROS Pertinent positives and negative per HPI, all others reviewed and negative  Physical Exam   BP (!) 102/53 (BP Location: Left Arm)   Pulse 77   Temp 98.7 F (37.1 C) (Oral)   Resp 18   Ht '5\' 3"'$  (1.6 m)   Wt 88 kg   LMP 10/26/2022   SpO2 100%   BMI 34.38 kg/m   Patient Vitals for the past 24 hrs:  BP Temp Temp src Pulse Resp SpO2 Height Weight  12/18/22 1222 (!) 102/53 -- -- 77 18 100 % -- --  12/18/22 1203 137/78 98.7 F (37.1 C) Oral 82 18 99 % -- --  12/18/22 1156 -- -- -- -- -- -- '5\' 3"'$  (1.6 m) 88 kg    Physical Exam Vitals reviewed.  Constitutional:      General: She is not in acute distress.    Appearance: She is well-developed. She is not diaphoretic.  Eyes:     General: No scleral icterus. Pulmonary:     Effort: Pulmonary effort is normal. No respiratory distress.  Abdominal:     General: There is no distension.     Palpations: Abdomen is soft.     Tenderness: There is generalized abdominal tenderness and tenderness in the suprapubic area. There is no guarding or rebound.  Skin:    General: Skin is warm and dry.  Neurological:     Mental Status: She is alert.     Coordination: Coordination normal.      Cervical Exam    Bedside Ultrasound Not done  My interpretation: n/a   Labs Results for orders placed or performed during the hospital encounter of 12/18/22 (from the past 24 hour(s))  Pregnancy, urine POC     Status: Abnormal   Collection Time: 12/18/22 11:31 AM  Result Value Ref Range   Preg Test, Ur POSITIVE  (A) NEGATIVE  Urinalysis, Routine w reflex microscopic -  Urine, Clean Catch     Status: None   Collection Time: 12/18/22 12:19 PM  Result Value Ref Range   Color, Urine YELLOW YELLOW   APPearance CLEAR CLEAR   Specific Gravity, Urine 1.021 1.005 - 1.030   pH 7.0 5.0 - 8.0   Glucose, UA NEGATIVE NEGATIVE mg/dL   Hgb urine dipstick NEGATIVE NEGATIVE   Bilirubin Urine NEGATIVE NEGATIVE   Ketones, ur NEGATIVE NEGATIVE mg/dL   Protein, ur NEGATIVE NEGATIVE mg/dL   Nitrite NEGATIVE NEGATIVE   Leukocytes,Ua NEGATIVE NEGATIVE  ABO/Rh     Status: None   Collection Time: 12/18/22 12:55 PM  Result Value Ref Range   ABO/RH(D) O NEG    Antibody Screen      NEG Performed at [facility] [address], [place], [place] [address]   Comprehensive metabolic panel     Status: Abnormal   Collection Time: 12/18/22 12:55 PM  Result Value Ref Range   Sodium 137 135 - 145 mmol/L   Potassium 3.8 3.5 - 5.1 mmol/L   Chloride 105 98 - 111 mmol/L   CO2 21 (L) 22 - 32 mmol/L   Glucose, Bld 89 70 - 99 mg/dL   BUN 9 6 - 20 mg/dL   Creatinine, Ser 0.55 0.44 - 1.00 mg/dL   Calcium 8.7 (L) 8.9 - 10.3 mg/dL   Total Protein 6.9 6.5 - 8.1 g/dL   Albumin 3.2 (L) 3.5 - 5.0 g/dL   AST 33 15 - 41 U/L   ALT 35 0 - 44 U/L   Alkaline Phosphatase 118 38 - 126 U/L   Total Bilirubin 0.5 0.3 - 1.2 mg/dL   GFR, Estimated >60 >60 mL/min   Anion gap 11 5 - 15  Rh IG workup (includes ABO/Rh)     Status: None (Preliminary result)   Collection Time: 12/18/22 12:55 PM  Result Value Ref Range   Gestational Age(Wks) 7    Unit Number [id number]    Blood Component Type RHIG    Unit division 00    Status of Unit ISSUED    Transfusion Status      OK TO TRANSFUSE Performed at [facility] [address] [place], [place] [address]   CBC     Status: Abnormal   Collection Time: 12/18/22 12:56 PM  Result Value Ref Range   WBC 9.2 4.0 - 10.5 K/uL   RBC 3.97 3.87 - 5.11 MIL/uL   Hemoglobin 11.2 (L) 12.0 -  15.0 g/dL   HCT 34.3 (L) 36.0 - 46.0 %   MCV 86.4 80.0 - 100.0 fL   MCH 28.2 26.0 - 34.0 pg   MCHC 32.7 30.0 - 36.0 g/dL   RDW 13.7 11.5 - 15.5 %   Platelets 383 150 - 400 K/uL   nRBC 0.0 0.0 - 0.2 %  hCG, quantitative, pregnancy     Status: Abnormal   Collection Time: 12/18/22 12:56 PM  Result Value Ref Range   hCG, Beta Chain, Quant, S 63,179 (H) <5 mIU/mL  Resp panel by RT-PCR (RSV, Flu A&B, Covid) Anterior Nasal Swab     Status: Abnormal   Collection Time: 12/18/22  1:30 PM   Specimen: Anterior Nasal Swab  Result Value Ref Range   SARS Coronavirus 2 by RT PCR POSITIVE (A) NEGATIVE   Influenza A by PCR NEGATIVE NEGATIVE   Influenza B by PCR NEGATIVE NEGATIVE   Resp Syncytial Virus by PCR NEGATIVE NEGATIVE    Imaging US OB LESS THAN  14 WEEKS WITH OB TRANSVAGINAL  Result Date: 12/18/2022 CLINICAL DATA:  Burning sensations in the pelvis. The patient took abortion pills 1 week ago. Seven weeks 4 days pregnant by last menstrual period. EXAM: OBSTETRIC <14 WK [place] AND TRANSVAGINAL OB US TECHNIQUE: Both transabdominal and transvaginal ultrasound examinations were performed for complete evaluation of the gestation as well as the maternal uterus, adnexal regions, and pelvic cul-de-sac. Transvaginal technique was performed to assess early pregnancy. COMPARISON:  08/27/2021 FINDINGS: Intrauterine gestational sac: Visualized Yolk sac:  Visualized Embryo:  Visualized Cardiac activity: Not visualized CRL:  3.8 mm mm   6 w   1 d                  [place] EDC: Not applicable Subchorionic hemorrhage:  Small to moderate Maternal uterus/adnexae: Unremarkable maternal ovaries. The left ovary could only be visualized transabdominally. No free peritoneal fluid. IMPRESSION: 1. Single intrauterine gestation with fetal demise at 6 weeks and 1 day of gestation. 2. Small to moderate-sized subchorionic hemorrhage. Critical Value/emergent results were called by telephone at the time of interpretation on 12/18/2022 at 2:21 pm to  provider Duwan Adrian , who verbally acknowledged these results. Electronically Signed   By: Claudie Revering M.D.   On: 12/18/2022 14:21    MAU Course  Procedures Lab Orders         Resp panel by RT-PCR (RSV, Flu A&B, Covid) Anterior Nasal Swab         Urinalysis, Routine w reflex microscopic -Urine, Clean Catch         CBC         hCG, quantitative, pregnancy         Comprehensive metabolic panel         Pregnancy, urine POC    Meds ordered this encounter  Medications   lactated ringers bolus 1,000 mL   ondansetron (ZOFRAN) injection 4 mg   ketorolac (TORADOL) 30 MG/ML injection 30 mg   rho (d) immune globulin (RHIG/RHOPHYLAC) injection 300 mcg   Imaging Orders         US OB LESS THAN 14 WEEKS WITH OB TRANSVAGINAL     MDM moderate  Assessment and Plan  #S/p TAB #Abdominal pain Patient with ongoing abdominal pain, nausea, and vomiting since procedure over a week ago. Confirmed with patient she had only a medical termination, not surgical. Ddx remains broad at this point, did have fever last week but would expect discharge and fevers to continue if she had retained POC's leading to septic abortion so low likelihood, but will check CBC and [place] regardless. May have routine illness that happened concurrently with TAB such as flu or COVID, will send respiratory panel. Given significant symptoms will give IVF and meds while awaiting the above workup.   1:26 PM  US shows incomplete abortion with fetal demise, explains ongoing symptoms. Discussed options with patient and after deliberation she would like to proceed with D&C. Message sent to schedule procedure. Routine return precautions.   3:56 PM   #Rh negative status Rhogam given  #COVID infection Partially explains symptoms as well. Outside of treatment window for Paxlovid, routine symptomatic treatment/precautions.    Dispo: discharged to home in stable condition.    Clarnce Flock, MD/MPH 12/18/22 3:56 PM  Allergies  as of 12/18/2022       Reactions   Amoxicillin Hives   Penicillins Hives   Has patient had a PCN reaction causing immediate rash, facial/tongue/throat swelling, SOB or lightheadedness with hypotension: Yes  Has patient had a PCN reaction causing severe rash involving mucus membranes or skin necrosis: No Has patient had a PCN reaction that required hospitalization Yes Has patient had a PCN reaction occurring within the last 10 years: No If all of the above answers are "NO", then may proceed with Cephalosporin use.        Medication List     TAKE these medications    acetaminophen 500 MG tablet Commonly known as: TYLENOL Take 500 mg by mouth every 4 (four) hours as needed for mild pain.   docusate sodium 100 MG capsule Commonly known as: Colace Take 1 capsule (100 mg total) by mouth 2 (two) times daily.   ferrous sulfate 325 (65 FE) MG tablet Take 325 mg by mouth daily with breakfast.   ferrous sulfate 325 (65 FE) MG tablet Take 1 tablet (325 mg total) by mouth 2 (two) times daily with a meal.   ibuprofen 600 MG tablet Commonly known as: ADVIL Take 1 tablet (600 mg total) by mouth every 6 (six) hours.   prenatal multivitamin Tabs tablet Take 1 tablet by mouth daily at 12 noon.   valACYclovir 500 MG tablet Commonly known as: VALTREX Take 500 mg by mouth daily.   Vitamin D3 50 MCG (2000 UT) Caps Generic drug: Cholecalciferol Take 4,000 Units by mouth daily.

## 2022-12-19 ENCOUNTER — Other Ambulatory Visit: Payer: Self-pay

## 2022-12-19 ENCOUNTER — Telehealth: Payer: Self-pay

## 2022-12-19 ENCOUNTER — Encounter (HOSPITAL_COMMUNITY): Payer: Self-pay | Admitting: Obstetrics and Gynecology

## 2022-12-19 LAB — RH IG WORKUP (INCLUDES ABO/RH)
Gestational Age(Wks): 7
Unit division: 0

## 2022-12-19 NOTE — Progress Notes (Signed)
PCP - No PCP saw [facility] Cardiologist - Denies EKG - 02/27/17 Chest x-ray - 02/26/17 ECHO - Denies Cardiac Cath - Denies CPAP - Denies Fasting Blood Sugar:  No DM COVID TEST- N/I Covid positive (per surgeon call to Hurley Cisco, RN)  Anesthesia review: No  -------------  SDW INSTRUCTIONS:  Your procedure is scheduled on Friday March 8th. Please report to [facility] Main Entrance "A" at 8:15 A.M., and check in at the Admitting office. Call this number if you have problems the morning of surgery: [phone]   Remember: Do not eat or drink after midnight the night before your surgery    Medications to take morning of surgery with a sip of water include: Tylenol if needed  As of today, STOP taking any Aspirin (unless otherwise instructed by your surgeon), Aleve, Naproxen, Ibuprofen, Motrin, Advil, Goody's, BC's, all herbal medications, fish oil, and all vitamins.    The Morning of Surgery Do not wear jewelry, make-up or nail polish. Do not wear lotions, powders, or perfumes, or deodorant Do not bring valuables to the hospital. [facility] is not responsible for any belongings or valuables.  If you are a smoker, DO NOT Smoke 24 hours prior to surgery  If you wear a CPAP at night please bring your mask the morning of surgery   Remember that you must have someone to transport you home after your surgery, and remain with you for 24 hours if you are discharged the same day.  Please bring cases for contacts, glasses, hearing aids, dentures or bridgework because it cannot be worn into surgery.   Patients discharged the day of surgery will not be allowed to drive home.   Please shower the NIGHT BEFORE/MORNING OF SURGERY (use antibacterial soap like DIAL soap if possible). Wear comfortable clothes the morning of surgery. Oral Hygiene is also important to reduce your risk of infection.  Remember - BRUSH YOUR TEETH THE MORNING OF SURGERY WITH YOUR REGULAR TOOTHPASTE  Patient  denies shortness of breath, fever, cough and chest pain.   Patient is currently has COVID diagnosed at [facility] on 12/18/22

## 2022-12-19 NOTE — Telephone Encounter (Signed)
Called patient, surgery date, time, location and preop instructions given, patient expressed understanding.

## 2022-12-20 ENCOUNTER — Encounter (HOSPITAL_COMMUNITY): Payer: Self-pay | Admitting: Obstetrics and Gynecology

## 2022-12-20 ENCOUNTER — Ambulatory Visit (HOSPITAL_COMMUNITY)
Admission: RE | Admit: 2022-12-20 | Discharge: 2022-12-20 | Disposition: A | Discharge status: Home / Self Care | Payer: Medicaid Other | Source: Ambulatory Visit | Attending: Obstetrics and Gynecology | Admitting: Obstetrics and Gynecology

## 2022-12-20 ENCOUNTER — Encounter (HOSPITAL_COMMUNITY)
Admission: RE | Disposition: A | Discharge status: Home / Self Care | Payer: Self-pay | Source: Ambulatory Visit | Attending: Obstetrics and Gynecology

## 2022-12-20 ENCOUNTER — Ambulatory Visit (HOSPITAL_BASED_OUTPATIENT_CLINIC_OR_DEPARTMENT_OTHER): Payer: Medicaid Other | Admitting: Anesthesiology

## 2022-12-20 ENCOUNTER — Other Ambulatory Visit: Payer: Self-pay

## 2022-12-20 ENCOUNTER — Ambulatory Visit (HOSPITAL_COMMUNITY): Payer: Medicaid Other | Admitting: Anesthesiology

## 2022-12-20 DIAGNOSIS — Z8616 Personal history of COVID-19: Secondary | ICD-10-CM | POA: Insufficient documentation

## 2022-12-20 DIAGNOSIS — Z09 Encounter for follow-up examination after completed treatment for conditions other than malignant neoplasm: Secondary | ICD-10-CM | POA: Insufficient documentation

## 2022-12-20 DIAGNOSIS — U071 COVID-19: Secondary | ICD-10-CM | POA: Diagnosis not present

## 2022-12-20 DIAGNOSIS — Z3A00 Weeks of gestation of pregnancy not specified: Secondary | ICD-10-CM | POA: Insufficient documentation

## 2022-12-20 DIAGNOSIS — O98519 Other viral diseases complicating pregnancy, unspecified trimester: Secondary | ICD-10-CM | POA: Diagnosis not present

## 2022-12-20 DIAGNOSIS — Z3A10 10 weeks gestation of pregnancy: Secondary | ICD-10-CM

## 2022-12-20 DIAGNOSIS — Z87891 Personal history of nicotine dependence: Secondary | ICD-10-CM | POA: Diagnosis not present

## 2022-12-20 DIAGNOSIS — O021 Missed abortion: Secondary | ICD-10-CM | POA: Diagnosis not present

## 2022-12-20 DIAGNOSIS — O034 Incomplete spontaneous abortion without complication: Secondary | ICD-10-CM | POA: Insufficient documentation

## 2022-12-20 DIAGNOSIS — O074 Failed attempted termination of pregnancy without complication: Secondary | ICD-10-CM

## 2022-12-20 HISTORY — PX: DILATION AND EVACUATION: SHX1459

## 2022-12-20 SURGERY — DILATION AND EVACUATION, UTERUS
Anesthesia: General

## 2022-12-20 MED ORDER — OXYCODONE HCL 5 MG PO TABS: 5.0000 mg | ORAL_TABLET | Freq: Once | ORAL | Status: DC | PRN

## 2022-12-20 MED ORDER — IBUPROFEN 600 MG PO TABS: 600.0000 mg | ORAL_TABLET | Freq: Four times a day (QID) | ORAL | 2 refills | Status: DC | PRN

## 2022-12-20 MED ORDER — OXYCODONE HCL 5 MG/5ML PO SOLN: 5.0000 mg | Freq: Once | ORAL | Status: DC | PRN

## 2022-12-20 MED ORDER — METHYLERGONOVINE MALEATE 0.2 MG/ML IJ SOLN
INTRAMUSCULAR | Status: AC
  Filled 2022-12-20: qty 1

## 2022-12-20 MED ORDER — FENTANYL CITRATE (PF) 250 MCG/5ML IJ SOLN
INTRAMUSCULAR | Status: AC
  Filled 2022-12-20: qty 5

## 2022-12-20 MED ORDER — MIDAZOLAM HCL 2 MG/2ML IJ SOLN
INTRAMUSCULAR | Status: AC
  Filled 2022-12-20: qty 2

## 2022-12-20 MED ORDER — POVIDONE-IODINE 10 % EX SWAB
2.0000 | Freq: Once | CUTANEOUS | Status: AC
  Administered 2022-12-20: 2 via TOPICAL

## 2022-12-20 MED ORDER — DOXYCYCLINE HYCLATE 100 MG IV SOLR
200.0000 mg | INTRAVENOUS | Status: AC
  Administered 2022-12-20: 200 mg via INTRAVENOUS
  Filled 2022-12-20: qty 200

## 2022-12-20 MED ORDER — LIDOCAINE 2% (20 MG/ML) 5 ML SYRINGE
INTRAMUSCULAR | Status: DC | PRN
  Administered 2022-12-20: 60 mg via INTRAVENOUS

## 2022-12-20 MED ORDER — SOD CITRATE-CITRIC ACID 500-334 MG/5ML PO SOLN
ORAL | Status: AC
  Administered 2022-12-20: 30 mL via ORAL
  Filled 2022-12-20: qty 30

## 2022-12-20 MED ORDER — ACETAMINOPHEN 500 MG PO TABS: 1000.0000 mg | ORAL_TABLET | ORAL | Status: AC

## 2022-12-20 MED ORDER — SOD CITRATE-CITRIC ACID 500-334 MG/5ML PO SOLN: 30.0000 mL | ORAL | Status: AC

## 2022-12-20 MED ORDER — CHLORHEXIDINE GLUCONATE 0.12 % MT SOLN
OROMUCOSAL | Status: AC
  Administered 2022-12-20: 15 mL
  Filled 2022-12-20: qty 15

## 2022-12-20 MED ORDER — KETOROLAC TROMETHAMINE 30 MG/ML IJ SOLN
INTRAMUSCULAR | Status: DC | PRN
  Administered 2022-12-20: 30 mg via INTRAVENOUS

## 2022-12-20 MED ORDER — ONDANSETRON HCL 4 MG/2ML IJ SOLN
INTRAMUSCULAR | Status: DC | PRN
  Administered 2022-12-20: 4 mg via INTRAVENOUS

## 2022-12-20 MED ORDER — FENTANYL CITRATE (PF) 100 MCG/2ML IJ SOLN: 25.0000 ug | INTRAMUSCULAR | Status: DC | PRN

## 2022-12-20 MED ORDER — MEPERIDINE HCL 25 MG/ML IJ SOLN: 6.2500 mg | INTRAMUSCULAR | Status: DC | PRN

## 2022-12-20 MED ORDER — PROMETHAZINE HCL 25 MG/ML IJ SOLN: 6.2500 mg | INTRAMUSCULAR | Status: DC | PRN

## 2022-12-20 MED ORDER — PROPOFOL 10 MG/ML IV BOLUS
INTRAVENOUS | Status: DC | PRN
  Administered 2022-12-20: 200 mg via INTRAVENOUS

## 2022-12-20 MED ORDER — SCOPOLAMINE 1 MG/3DAYS TD PT72
1.0000 | MEDICATED_PATCH | TRANSDERMAL | Status: DC
  Administered 2022-12-20: 1.5 mg via TRANSDERMAL
  Filled 2022-12-20: qty 1

## 2022-12-20 MED ORDER — PROPOFOL 10 MG/ML IV BOLUS
INTRAVENOUS | Status: AC
  Filled 2022-12-20: qty 20

## 2022-12-20 MED ORDER — MIDAZOLAM HCL 2 MG/2ML IJ SOLN: 0.5000 mg | Freq: Once | INTRAMUSCULAR | Status: DC | PRN

## 2022-12-20 MED ORDER — MIDAZOLAM HCL 2 MG/2ML IJ SOLN
INTRAMUSCULAR | Status: DC | PRN
  Administered 2022-12-20: 2 mg via INTRAVENOUS

## 2022-12-20 MED ORDER — LACTATED RINGERS IV SOLN: INTRAVENOUS | Status: DC

## 2022-12-20 MED ORDER — ACETAMINOPHEN 500 MG PO TABS
ORAL_TABLET | ORAL | Status: AC
  Administered 2022-12-20: 1000 mg via ORAL
  Filled 2022-12-20: qty 2

## 2022-12-20 MED ORDER — DEXAMETHASONE SODIUM PHOSPHATE 10 MG/ML IJ SOLN
INTRAMUSCULAR | Status: DC | PRN
  Administered 2022-12-20: 5 mg via INTRAVENOUS

## 2022-12-20 MED ORDER — ACETAMINOPHEN 500 MG PO TABS: 1000.0000 mg | ORAL_TABLET | Freq: Once | ORAL | Status: DC

## 2022-12-20 MED ORDER — FENTANYL CITRATE (PF) 250 MCG/5ML IJ SOLN
INTRAMUSCULAR | Status: DC | PRN
  Administered 2022-12-20: 50 ug via INTRAVENOUS

## 2022-12-20 SURGICAL SUPPLY — 20 items
CATH ROBINSON RED A/P 16FR (CATHETERS) IMPLANT
FILTER UTR ASPR ASSEMBLY (MISCELLANEOUS) ×1 IMPLANT
GLOVE SURG ORTHO 8.0 STRL STRW (GLOVE) ×1 IMPLANT
GOWN STRL REUS W/ TWL LRG LVL3 (GOWN DISPOSABLE) ×1 IMPLANT
GOWN STRL REUS W/ TWL XL LVL3 (GOWN DISPOSABLE) ×1 IMPLANT
GOWN STRL REUS W/TWL LRG LVL3 (GOWN DISPOSABLE) ×1
GOWN STRL REUS W/TWL XL LVL3 (GOWN DISPOSABLE) ×1
HOSE CONNECTING 18IN BERKELEY (TUBING) ×1 IMPLANT
KIT BERKELEY 1ST TRI 3/8 NO TR (MISCELLANEOUS) ×1 IMPLANT
KIT BERKELEY 1ST TRIMESTER 3/8 (MISCELLANEOUS) ×1 IMPLANT
NS IRRIG 1000ML POUR BTL (IV SOLUTION) ×1 IMPLANT
PACK VAGINAL MINOR WOMEN LF (CUSTOM PROCEDURE TRAY) ×1 IMPLANT
PAD OB MATERNITY 4.3X12.25 (PERSONAL CARE ITEMS) ×1 IMPLANT
SET BERKELEY SUCTION TUBING (SUCTIONS) ×1 IMPLANT
TOWEL GREEN STERILE FF (TOWEL DISPOSABLE) ×1 IMPLANT
UNDERPAD 30X36 HEAVY ABSORB (UNDERPADS AND DIAPERS) ×1 IMPLANT
VACURETTE 10 RIGID CVD (CANNULA) IMPLANT
VACURETTE 7MM CVD STRL WRAP (CANNULA) IMPLANT
VACURETTE 8 RIGID CVD (CANNULA) IMPLANT
VACURETTE 9 RIGID CVD (CANNULA) IMPLANT

## 2022-12-20 NOTE — Anesthesia Preprocedure Evaluation (Addendum)
Anesthesia Evaluation  Patient identified by MRN, date of birth, ID band Patient awake    Reviewed: Allergy & Precautions, NPO status , Patient's Chart, lab work & pertinent test results  History of Anesthesia Complications Negative for: history of anesthetic complications  Airway Mallampati: I  TM Distance: >3 FB Neck ROM: Full    Dental  (+) Dental Advisory Given   Pulmonary Patient abstained from smoking., former smoker Covid positive   breath sounds clear to auscultation       Cardiovascular negative cardio ROS  Rhythm:Regular Rate:Normal     Neuro/Psych negative neurological ROS     GI/Hepatic negative GI ROS, Neg liver ROS,,,  Endo/Other  BMI 34  Renal/GU negative Renal ROS     Musculoskeletal   Abdominal   Peds  Hematology negative hematology ROS (+)   Anesthesia Other Findings   Reproductive/Obstetrics Retained products of conception                             Anesthesia Physical Anesthesia Plan  ASA: 3  Anesthesia Plan: General   Post-op Pain Management: Tylenol PO (pre-op)*   Induction: Intravenous  PONV Risk Score and Plan: 3 and Ondansetron, Dexamethasone and Scopolamine patch - Pre-op  Airway Management Planned: LMA  Additional Equipment: None  Intra-op Plan:   Post-operative Plan:   Informed Consent: I have reviewed the patients History and Physical, chart, labs and discussed the procedure including the risks, benefits and alternatives for the proposed anesthesia with the patient or authorized representative who has indicated his/her understanding and acceptance.     Dental advisory given  Plan Discussed with: CRNA and Surgeon  Anesthesia Plan Comments:        Anesthesia Quick Evaluation

## 2022-12-20 NOTE — H&P (Signed)
OB/GYN Pre-Op History and Physical  ZANITA REDBIRD is a 27 y.o. [id number] presenting for suction dilation and evacuation 2/2 incomplete abortion in the setting of medical TAB.  Pt took medication from her outpatient provider on 12/09/22.  She had vaginal bleeding  which was heavy 2-3 days after administration of the medication.  She has continued with some nausea and vomiting.  During evaluation pt was diagnosed with covid..       Past Medical History:  Diagnosis Date   Acne    Anemia    Headache    Herpes    History of chlamydia infection     Past Surgical History:  Procedure Laterality Date   HERNIA REPAIR     umbilical   WISDOM TOOTH EXTRACTION      OB History  Gravida Para Term Preterm AB Living  '5 4 4 '$ 0 0 4  SAB IAB Ectopic Multiple Live Births  0 0 0 0 4    # Outcome Date GA Lbr Len/2nd Weight Sex Delivery Anes PTL Lv  5 Current           4 Term 04/19/22 51w5d01:28 / 02:34 3290 g F Vag-Spont EPI  LIV     Birth Comments: WNL  3 Term 03/12/19 355w2d9:10 / 00:47 3960 g M Vag-Spont None  LIV  2 Term 03/30/16 4057w0d:30 / 00:03 3005 g M Vag-Spont EPI  LIV  1 Term 09/02/12 41w39w1d03 / 01:34 3450 g F Vag-Spont EPI  LIV    Social History   Socioeconomic History   Marital status: Single    Spouse name: Not on file   Number of children: 4   Years of education: Not on file   Highest education level: Not on file  Occupational History   Not on file  Tobacco Use   Smoking status: Former    Types: Cigars    Quit date: 2020    Years since quitting: 4.1   Smokeless tobacco: Never   Tobacco comments:    not smoked since pregnant, passive smoker  Vaping Use   Vaping Use: Former   Quit date: 12/11/2022  Substance and Sexual Activity   Alcohol use: Not Currently   Drug use: Not Currently    Types: Marijuana, Other-see comments    Comment: last smoked 10/2022, eats edibles   Sexual activity: Not Currently    Birth control/protection: None  Other Topics Concern    Not on file  Social History Narrative   Not on file   Social Determinants of Health   Financial Resource Strain: Not on file  Food Insecurity: Not on file  Transportation Needs: Not on file  Physical Activity: Not on file  Stress: Not on file  Social Connections: Not on file    Family History  Problem Relation Age of Onset   Hypertension Mother    Hypertension Father    Asthma Sister    Learning disabilities Sister    Kidney disease Sister        dialysis   Diabetes Paternal Grandmother     Medications Prior to Admission  Medication Sig Dispense Refill Last Dose   acetaminophen (TYLENOL) 500 MG tablet Take 1,000 mg by mouth every 4 (four) hours as needed for mild pain.   Past Week   ibuprofen (ADVIL) 600 MG tablet Take 1 tablet (600 mg total) by mouth every 6 (six) hours. (Patient taking differently: Take 800 mg by mouth every 6 (six) hours as needed for mild  pain or moderate pain.) 30 tablet 0 Past Month   Prenatal Vit-Fe Fumarate-FA (PRENATAL MULTIVITAMIN) TABS tablet Take 1 tablet by mouth daily at 12 noon.   Past Week   docusate sodium (COLACE) 100 MG capsule Take 1 capsule (100 mg total) by mouth 2 (two) times daily. (Patient not taking: Reported on 12/19/2022) 60 capsule 3 Not Taking   ferrous sulfate 325 (65 FE) MG tablet Take 1 tablet (325 mg total) by mouth 2 (two) times daily with a meal. (Patient not taking: Reported on 12/19/2022) 60 tablet 3 Not Taking    Allergies  Allergen Reactions   Amoxicillin Hives   Penicillins Hives    Has patient had a PCN reaction causing immediate rash, facial/tongue/throat swelling, SOB or lightheadedness with hypotension: Yes Has patient had a PCN reaction causing severe rash involving mucus membranes or skin necrosis: No Has patient had a PCN reaction that required hospitalization Yes Has patient had a PCN reaction occurring within the last 10 years: No If all of the above answers are "NO", then may proceed with Cephalosporin use.     Review of Systems: Negative except for what is mentioned in HPI.     Physical Exam: BP 107/73   Pulse 66   Temp 98.6 F (37 C) (Oral)   Resp 18   Ht '5\' 3"'$  (1.6 m)   Wt 86.2 kg   LMP 10/26/2022   SpO2 100%   BMI 33.66 kg/m  CONSTITUTIONAL: Well-developed, well-nourished female in no acute distress.  HENT:  Normocephalic, atraumatic, External right and left ear normal. Oropharynx is clear and moist EYES: Conjunctivae and EOM are normal. NECK: Normal range of motion, supple, no masses SKIN: Skin is warm and dry. No rash noted. Not diaphoretic. No erythema. No pallor. [place]: Alert and oriented to person, place, and time. Normal reflexes, muscle tone coordination. No cranial nerve deficit noted. PSYCHIATRIC: Normal mood and affect. Normal behavior. Normal judgment and thought content. CARDIOVASCULAR: Normal heart rate note, regular rhythm RESPIRATORY: Effort and breath sounds normal, no problems with respiration noted ABDOMEN: Soft, nontender, nondistended, PELVIC: Deferred MUSCULOSKELETAL: Normal range of motion. No edema and no tenderness. 2+ distal pulses.   Pertinent Labs/Studies:   Results for orders placed or performed during the hospital encounter of 12/20/22 (from the past 72 hour(s))  Type and screen [place]     Status: None (Preliminary result)   Collection Time: 12/20/22 10:10 AM  Result Value Ref Range   ABO/RH(D) PENDING    Antibody Screen PENDING    Sample Expiration      12/23/2022,2359 Performed at [facility], [place] [address] [place], [place] [address]    Imaging US OB LESS THAN 14 WEEKS WITH OB TRANSVAGINAL   Result Date: 12/18/2022 CLINICAL DATA:  Burning sensations in the pelvis. The patient took abortion pills 1 week ago. Seven weeks 4 days pregnant by last menstrual period. EXAM: OBSTETRIC <14 WK [place] AND TRANSVAGINAL OB US TECHNIQUE: Both transabdominal and transvaginal ultrasound examinations were performed for  complete evaluation of the gestation as well as the maternal uterus, adnexal regions, and pelvic cul-de-sac. Transvaginal technique was performed to assess early pregnancy. COMPARISON:  08/27/2021 FINDINGS: Intrauterine gestational sac: Visualized Yolk sac:  Visualized Embryo:  Visualized Cardiac activity: Not visualized CRL:  3.8 mm mm   6 w   1 d                  [place] EDC: Not applicable Subchorionic hemorrhage:  Small  to moderate Maternal uterus/adnexae: Unremarkable maternal ovaries. The left ovary could only be visualized transabdominally. No free peritoneal fluid. IMPRESSION: 1. Single intrauterine gestation with fetal demise at 6 weeks and 1 day of gestation. 2. Small to moderate-sized subchorionic hemorrhage. Critical Value/emergent results were called by telephone at the time of interpretation on 12/18/2022 at 2:21 pm to provider MATTHEW ECKSTAT , who verbally acknowledged these results. Electronically Signed   By: Claudie Revering M.D.   On: 12/18/2022 14:21        Assessment and Plan :DONNARAE SATTERFIELD is a 27 y.o. [id number] here for completion of incomplete abortion with retained POC.Marland Kitchen   Plan for suction dilation and evacuation NPO Admission labs ordered VS Q4     Lynnda Shields, M.D. Attending [place], [facility] for [employer], [place]

## 2022-12-20 NOTE — Op Note (Signed)
Peggy Mason PROCEDURE DATE: 12/20/2022  PREOPERATIVE DIAGNOSIS: incomplete abortion POSTOPERATIVE DIAGNOSIS: The same PROCEDURE:     Dilation and Evacuation SURGEON:  Lynnda Shields  INDICATIONS: 27 y.o. [id number] with incomplete miscarriage, needing surgical completion.  Risks of surgery were discussed with the patient including but not limited to: bleeding which may require transfusion; infection which may require antibiotics; injury to uterus or surrounding organs; need for additional procedures including laparotomy or laparoscopy; possibility of intrauterine scarring which may impair future fertility; and other postoperative/anesthesia complications. Written informed consent was obtained.    FINDINGS:  A 10 week size uterus, moderate amounts of products of conception, specimen sent to pathology.  ANESTHESIA: General. INTRAVENOUS FLUIDS:  600 ml of LR ESTIMATED BLOOD LOSS:  Less than 20 ml. SPECIMENS:  Products of conception sent to pathology**and some products of conception were sent for [facility] genetic analysis COMPLICATIONS:  None immediate.  PROCEDURE DETAILS:  The patient received intravenous Doxycycline while in the preoperative area.  She was then taken to the operating room where general anesthesia was administered and was found to be adequate.  After an adequate timeout was performed, she was placed in the dorsal lithotomy position and examined; then prepped and draped in the sterile manner.   Her bladder was catheterized for an unmeasured amount of clear, yellow urine. A vaginal speculum was then placed in the patient's vagina and a single tooth tenaculum was applied to the anterior lip of the cervix.   The cervix was gently dilated to accommodate a 8 mm suction curette that was gently advanced to the uterine fundus. The suction device was then activated and curette slowly rotated to clear the uterus of products of conception.  Suction curettage was done until complete emptying of the  uterus was confirmed. There was minimal bleeding noted and the tenaculum removed with good hemostasis noted.   All instruments were removed from the patient's vagina.  Sponge and instrument counts were correct times two  The patient tolerated the procedure well and was taken to the recovery area extubated, awake, and in stable condition.  The patient will be discharged to home as per PACU criteria.  Routine postoperative instructions given.  She was prescribed Ibuprofen   She will follow up in the office in 2-3 weeks for postoperative evaluation  Lynnda Shields, MD, [place], [facility] for [place]

## 2022-12-20 NOTE — Transfer of Care (Signed)
Immediate Anesthesia Transfer of Care Note  Patient: Peggy Mason  Procedure(s) Performed: DILATATION AND EVACUATION  Patient Location: PACU  Anesthesia Type:General  Level of Consciousness: awake and patient cooperative  Airway & Oxygen Therapy: Patient Spontanous Breathing and Patient connected to face mask oxygen  Post-op Assessment: Report given to RN, Post -op Vital signs reviewed and stable, and Patient moving all extremities  Post vital signs: Reviewed and stable  Last Vitals:  Vitals Value Taken Time  BP 119/96 12/20/22 1330  Temp 36.7 C 12/20/22 1330  Pulse 87 12/20/22 1335  Resp 21 12/20/22 1335  SpO2 100 % 12/20/22 1335  Vitals shown include unvalidated device data.  Last Pain:  Vitals:   12/20/22 1330  TempSrc:   PainSc: 0-No pain      Patients Stated Pain Goal: 1 ([phone]  Complications: No notable events documented.

## 2022-12-20 NOTE — Anesthesia Procedure Notes (Signed)
Procedure Name: LMA Insertion Date/Time: 12/20/2022 12:51 PM  Performed by: Elvin So, CRNAPre-anesthesia Checklist: Patient identified, Emergency Drugs available, Suction available and Patient being monitored Patient Re-evaluated:Patient Re-evaluated prior to induction Oxygen Delivery Method: Circle System Utilized Preoxygenation: Pre-oxygenation with 100% oxygen Induction Type: IV induction Ventilation: Mask ventilation without difficulty LMA: LMA inserted LMA Size: 4.0 Number of attempts: 1 Airway Equipment and Method: Bite block Placement Confirmation: positive ETCO2 Tube secured with: Tape Dental Injury: Teeth and Oropharynx as per pre-operative assessment

## 2022-12-21 ENCOUNTER — Encounter (HOSPITAL_COMMUNITY): Payer: Self-pay | Admitting: Obstetrics and Gynecology

## 2022-12-22 NOTE — Anesthesia Postprocedure Evaluation (Signed)
Anesthesia Post Note  Patient: RICKIYAH GREENWALD  Procedure(s) Performed: DILATATION AND EVACUATION     Patient location during evaluation: PACU Anesthesia Type: General Level of consciousness: awake and alert Pain management: pain level controlled Vital Signs Assessment: post-procedure vital signs reviewed and stable Respiratory status: spontaneous breathing, nonlabored ventilation, respiratory function stable and patient connected to nasal cannula oxygen Cardiovascular status: blood pressure returned to baseline and stable Postop Assessment: no apparent nausea or vomiting Anesthetic complications: no  No notable events documented.  Last Vitals:  Vitals:   12/20/22 1345 12/20/22 1400  BP: 118/75 116/70  Pulse: 78 60  Resp: 19 17  Temp:  36.7 C  SpO2: 99% 100%    Last Pain:  Vitals:   12/20/22 1400  TempSrc:   PainSc: 0-No pain                 Alexius Hangartner L Kolsen Choe

## 2022-12-23 LAB — TYPE AND SCREEN
ABO/RH(D): O NEG
Antibody Screen: POSITIVE
Unit division: 0
Unit division: 0

## 2022-12-23 LAB — BPAM RBC
Blood Product Expiration Date: 202403142359
Blood Product Expiration Date: 202403162359
Unit Type and Rh: 9500
Unit Type and Rh: 9500

## 2022-12-23 LAB — SURGICAL PATHOLOGY

## 2023-01-06 ENCOUNTER — Encounter: Payer: Medicaid Other | Admitting: Obstetrics and Gynecology

## 2023-01-21 ENCOUNTER — Encounter: Payer: Medicaid Other | Admitting: Obstetrics and Gynecology

## 2023-06-04 ENCOUNTER — Ambulatory Visit (INDEPENDENT_AMBULATORY_CARE_PROVIDER_SITE_OTHER): Payer: Medicaid Other | Admitting: *Deleted

## 2023-06-04 DIAGNOSIS — Z3481 Encounter for supervision of other normal pregnancy, first trimester: Secondary | ICD-10-CM

## 2023-06-04 DIAGNOSIS — O219 Vomiting of pregnancy, unspecified: Secondary | ICD-10-CM

## 2023-06-04 DIAGNOSIS — Z3480 Encounter for supervision of other normal pregnancy, unspecified trimester: Secondary | ICD-10-CM | POA: Insufficient documentation

## 2023-06-04 DIAGNOSIS — Z3A11 11 weeks gestation of pregnancy: Secondary | ICD-10-CM

## 2023-06-04 MED ORDER — BLOOD PRESSURE KIT DEVI: 1.0000 | 0 refills | Status: AC

## 2023-06-04 MED ORDER — PROMETHAZINE HCL 25 MG PO TABS: 25.0000 mg | ORAL_TABLET | Freq: Four times a day (QID) | ORAL | 1 refills | Status: AC | PRN

## 2023-06-04 NOTE — Patient Instructions (Signed)

## 2023-06-04 NOTE — Progress Notes (Signed)
New OB Intake  I connected with Peggy Mason  on 06/04/23 at  8:15 AM EDT by Virtual Visit and verified that I am speaking with the correct person using two identifiers. Nurse is located at [facility] and pt is located at home.  I discussed the limitations, risks, security and privacy concerns of performing an evaluation and management service by telephone and the availability of in person appointments. I also discussed with the patient that there may be a patient responsible charge related to this service. The patient expressed understanding and agreed to proceed.  I explained I am completing New OB Intake today. We discussed EDD of 08/02/2023, by Last Menstrual Period. Pt is [id number]. I reviewed her allergies, medications and Medical/Surgical/OB history.    Patient Active Problem List   Diagnosis Date Noted   Retained products of conception following abortion 12/20/2022   Incomplete miscarriage 12/20/2022   Normal labor 04/19/2022   Chronic headache disorder 09/28/2021   Genital herpes simplex 08/20/2018   Susceptible varicella--non-immune 08/15/2015   Rh negative, maternal - received Rhophylac on 08/04/15 due to spotting 08/14/2015   Rubella non-immune status, antepartum 08/14/2015   Allergy to penicillin - rash, hives -- required hospitalization 08/14/2015   Allergy to amoxicillin -- rash, hives 08/14/2015    Concerns addressed today  Delivery Plans Plans to deliver at [facility]. Discussed the nature of our practice with multiple providers including residents and students. Due to the size of the practice, the delivering provider may not be the same as those providing prenatal care.   Patient is interested in water birth. Offered upcoming OB visit with CNM to discuss further.  MyChart/Babyscripts MyChart access verified. I explained pt will have some visits in office and some virtually. Babyscripts instructions given and order placed. Patient verifies receipt of registration  text/e-mail. Account successfully created and app downloaded.  Blood Pressure Cuff/Weight Scale Blood pressure cuff ordered for patient to pick-up from [employer]. Explained after first prenatal appt pt will check weekly and document in Babyscripts. Patient does not have weight scale; patient may purchase if they desire to track weight weekly in Babyscripts.  Anatomy US Explained first scheduled [place] will be around 19 weeks. Anatomy US scheduled for TBD at TBD.  Interested in [place]? If yes, send referral and doula dot phrase.   Is patient a candidate for Babyscripts Optimization? Yes  First visit review I reviewed new OB appt with patient. Explained pt will be seen by Peggy Mason, CNM at first visit. Discussed Peggy Mason genetic screening with patient. Requests [facility] and [facility].. Routine prenatal labs  not collected/ virtual visit.    Last Pap No results found for: "DIAGPAP"  Peggy Lemon, RN 06/04/2023  8:25 AM

## 2023-06-11 ENCOUNTER — Other Ambulatory Visit: Payer: Self-pay

## 2023-06-11 ENCOUNTER — Encounter (HOSPITAL_COMMUNITY): Payer: Self-pay | Admitting: Obstetrics and Gynecology

## 2023-06-11 ENCOUNTER — Inpatient Hospital Stay (HOSPITAL_COMMUNITY)
Admission: AD | Admit: 2023-06-11 | Discharge: 2023-06-11 | Disposition: A | Discharge status: Home / Self Care | Payer: Medicaid Other | Attending: Family Medicine | Admitting: Family Medicine

## 2023-06-11 DIAGNOSIS — O23591 Infection of other part of genital tract in pregnancy, first trimester: Secondary | ICD-10-CM | POA: Diagnosis not present

## 2023-06-11 DIAGNOSIS — Z3A12 12 weeks gestation of pregnancy: Secondary | ICD-10-CM | POA: Diagnosis not present

## 2023-06-11 DIAGNOSIS — B3731 Acute candidiasis of vulva and vagina: Secondary | ICD-10-CM | POA: Diagnosis not present

## 2023-06-11 DIAGNOSIS — O219 Vomiting of pregnancy, unspecified: Secondary | ICD-10-CM | POA: Insufficient documentation

## 2023-06-11 DIAGNOSIS — A5901 Trichomonal vulvovaginitis: Secondary | ICD-10-CM | POA: Insufficient documentation

## 2023-06-11 DIAGNOSIS — K920 Hematemesis: Secondary | ICD-10-CM | POA: Diagnosis present

## 2023-06-11 LAB — URINALYSIS, ROUTINE W REFLEX MICROSCOPIC
Glucose, UA: NEGATIVE mg/dL
Ketones, ur: 80 mg/dL — AB
Nitrite: NEGATIVE
Protein, ur: 30 mg/dL — AB
RBC / HPF: >50 RBC/hpf (ref 0–5)
Specific Gravity, Urine: 1.031 — ABNORMAL HIGH (ref 1.005–1.030)
pH: 5 (ref 5.0–8.0)

## 2023-06-11 LAB — WET PREP, GENITAL
Clue Cells Wet Prep HPF POC: NONE SEEN
Sperm: NONE SEEN
WBC, Wet Prep HPF POC: >=10 — AB (ref <10)
Yeast Wet Prep HPF POC: NONE SEEN

## 2023-06-11 MED ORDER — LACTATED RINGERS IV BOLUS
2000.0000 mL | Freq: Once | INTRAVENOUS | Status: AC
  Administered 2023-06-11: 2000 mL via INTRAVENOUS

## 2023-06-11 MED ORDER — ONDANSETRON 4 MG PO TBDP: 8.0000 mg | ORAL_TABLET | Freq: Once | ORAL | Status: DC

## 2023-06-11 MED ORDER — ONDANSETRON 4 MG PO TBDP
8.0000 mg | ORAL_TABLET | Freq: Once | ORAL | Status: AC
  Administered 2023-06-11: 8 mg via ORAL
  Filled 2023-06-11: qty 2

## 2023-06-11 MED ORDER — SCOPOLAMINE 1 MG/3DAYS TD PT72: 1.0000 | MEDICATED_PATCH | TRANSDERMAL | Status: AC

## 2023-06-11 MED ORDER — METRONIDAZOLE 500 MG PO TABS: 500.0000 mg | ORAL_TABLET | Freq: Two times a day (BID) | ORAL | 0 refills | Status: AC

## 2023-06-11 MED ORDER — ONDANSETRON HCL 4 MG PO TABS: 4.0000 mg | ORAL_TABLET | Freq: Three times a day (TID) | ORAL | Status: AC | PRN

## 2023-06-11 NOTE — MAU Note (Signed)
.  Peggy Mason is a 27 y.o. at [redacted]w[redacted]d here in MAU reporting: N/V since becoming pregnant but worsened yesterday. Reports emesis x7 yesterday and x2 today. Also reports seeing "a little bit of blood" in emesis @ 0300 and noticed pink when wiping after obtaining urine sample here. LMP: 10/26/2022 Onset of complaint: 06/10/23 Pain score: 3 There were no vitals filed for this visit.   FHT:155 Lab orders placed from triage:

## 2023-06-11 NOTE — MAU Provider Note (Addendum)
Chief Complaint: Nausea and Emesis    SUBJECTIVE HPI: Peggy Mason is a 27 y.o. [id number] at [redacted]w[redacted]d by LMP who presents to maternity admissions reporting nausea and vomiting yesterday; emesis x7. 2x emesis today. She noted pink on toilet paper when wiping.   Patient endorses similar history with all of her prior pregnancies that have required IV fluids and several medications.   Patient is scheduled with [facility] 9/21 and was provided a prescription of promethazine but has not yet tried it because she was told at the pharmacy to "use cautiously" and didn't want to risk it.  She denies vaginal bleeding, vaginal itching/burning, urinary symptoms, h/a, dizziness, n/v, or fever/chills.     HPI  Past Medical History:  Diagnosis Date   Acne    Anemia    Headache    Herpes    History of chlamydia infection    Past Surgical History:  Procedure Laterality Date   DILATION AND EVACUATION N/A 12/20/2022   Procedure: DILATATION AND EVACUATION;  Surgeon: Warden Fillers, MD;  Location: [facility] OR;  Service: Gynecology;  Laterality: N/A;   HERNIA REPAIR     umbilical   WISDOM TOOTH EXTRACTION     Social History   Socioeconomic History   Marital status: Single    Spouse name: Not on file   Number of children: 4   Years of education: Not on file   Highest education level: Not on file  Occupational History   Not on file  Tobacco Use   Smoking status: Former    Types: Cigars    Quit date: 2020    Years since quitting: 4.6   Smokeless tobacco: Never   Tobacco comments:    not smoked since pregnant, passive smoker  Vaping Use   Vaping status: Former   Quit date: 12/11/2022  Substance and Sexual Activity   Alcohol use: Not Currently   Drug use: Not Currently    Types: Marijuana, Other-see comments    Comment: last smoked 10/2022, eats edibles   Sexual activity: Not Currently    Birth control/protection: None  Other Topics Concern   Not on file  Social History Narrative   Not on file    Social Determinants of Health   Financial Resource Strain: Not on file  Food Insecurity: Not on file  Transportation Needs: Not on file  Physical Activity: Not on file  Stress: Not on file  Social Connections: Not on file  Intimate Partner Violence: Not on file   No current facility-administered medications on file prior to encounter.   Current Outpatient Medications on File Prior to Encounter  Medication Sig Dispense Refill   Blood Pressure Monitoring (BLOOD PRESSURE KIT) DEVI 1 Device by Does not apply route once a week. 1 each 0   ibuprofen (ADVIL) 600 MG tablet Take 1 tablet (600 mg total) by mouth every 6 (six) hours as needed for headache, mild pain, moderate pain or cramping. 30 tablet 2   Prenatal Vit-Fe Fumarate-FA (PRENATAL MULTIVITAMIN) TABS tablet Take 1 tablet by mouth daily at 12 noon.     promethazine (PHENERGAN) 25 MG tablet Take 1 tablet (25 mg total) by mouth every 6 (six) hours as needed for nausea or vomiting. 30 tablet 1   Allergies  Allergen Reactions   Amoxicillin Hives   Penicillins Hives    Has patient had a PCN reaction causing immediate rash, facial/tongue/throat swelling, SOB or lightheadedness with hypotension: Yes Has patient had a PCN reaction causing severe rash involving mucus  membranes or skin necrosis: No Has patient had a PCN reaction that required hospitalization Yes Has patient had a PCN reaction occurring within the last 10 years: No If all of the above answers are "NO", then may proceed with Cephalosporin use.    I have reviewed patient's Past Medical Hx, Surgical Hx, Family Hx, Social Hx, medications and allergies.   ROS:  Review of Systems Review of Systems  Other systems negative   Physical Exam  Physical Exam Patient Vitals for the past 24 hrs:  BP Temp Temp src Pulse Resp Height Weight  06/11/23 1037 (!) 113/56 98.7 F (37.1 C) Oral 65 20 5' 3.75" (1.619 m) 87.7 kg   Constitutional: Well-developed, well-nourished female in  no acute distress.  Cardiovascular: normal rate Respiratory: normal effort GI: Abd soft, non-tender. Pos BS x 4 MS: Extremities nontender, no edema, normal ROM Neurologic: Alert and oriented x 4.  GU: Neg CVAT.  FHT 155 by doppler  LAB RESULTS Results for orders placed or performed during the hospital encounter of 06/11/23 (from the past 24 hour(s))  Urinalysis, Routine w reflex microscopic -Urine, Clean Catch     Status: Abnormal   Collection Time: 06/11/23 10:46 AM  Result Value Ref Range   Color, Urine AMBER (A) YELLOW   APPearance HAZY (A) CLEAR   Specific Gravity, Urine 1.031 (H) 1.005 - 1.030   pH 5.0 5.0 - 8.0   Glucose, UA NEGATIVE NEGATIVE mg/dL   Hgb urine dipstick MODERATE (A) NEGATIVE   Bilirubin Urine SMALL (A) NEGATIVE   Ketones, ur 80 (A) NEGATIVE mg/dL   Protein, ur 30 (A) NEGATIVE mg/dL   Nitrite NEGATIVE NEGATIVE   Leukocytes,Ua MODERATE (A) NEGATIVE   RBC / HPF >50 0 - 5 RBC/hpf   WBC, UA 21-50 0 - 5 WBC/hpf   Bacteria, UA FEW (A) NONE SEEN   Squamous Epithelial / HPF 11-20 0 - 5 /HPF   Mucus PRESENT   Wet prep, genital     Status: Abnormal   Collection Time: 06/11/23 12:56 PM  Result Value Ref Range   Yeast Wet Prep HPF POC NONE SEEN NONE SEEN   Trich, Wet Prep PRESENT (A) NONE SEEN   Clue Cells Wet Prep HPF POC NONE SEEN NONE SEEN   WBC, Wet Prep HPF POC >=10 (A) <10   Sperm NONE SEEN     --/--/O NEG (03/08 1010)  IMAGING No results found.  MAU Management/MDM: I have reviewed the triage vital signs and the nursing notes.   Pertinent labs & imaging results that were available during my care of the patient were reviewed by me and considered in my medical decision making (see chart for details).      I have reviewed her medical records including past results, notes and treatments. Medical, Surgical, and family history were reviewed.  Medications and recent lab tests were reviewed  Ordered UA to evaluate for dehydration  Discussed with  presentation, exam findings, and results with Dr. Crissie Reese. Treatments in MAU included zofran ODT 8mg . 2L LR bolus.  This bleeding/pain can represent a normal pregnancy with bleeding, spontaneous abortion or even an ectopic which can be life-threatening.  The process as listed above helps to determine which of these is present.    ASSESSMENT 1. Nausea/vomiting in pregnancy   2. [redacted] weeks gestation of pregnancy   3. Trichomonal vaginitis during pregnancy in first trimester   Symptoms improved with 2L LR bolus. Patient tolerating PO intake.  - encouraged use of prescribed promethazine -  provided prescription for additional zofran to be used together - recommended small bland frequent meals/snacks  Trichomoniasis  Trichomoniasis is an STI (sexually transmitted infection) that can affect both women and men. This condition often has no symptoms (is asymptomatic), especially in men. Follow up in clinic in 3-4 weeks after completion of treatment for re-testing Without treatment, this condition can last for months or years. You should not have sex until 7-10 days after you finish your medicine, or until your health care provider approves. Ask your health care provider when you may start to have sex again. Discuss your infection with your sexual partner(s). Make sure that your partner gets tested and treated, if necessary.   PLAN Discharge home   Follow-up Information     [facility] Follow up.   Why: As scheduled for prenatal care. Or sooner as needed for recurrance of symptoms.  In MAU as needed Contact information: [address] [place] [address] [phone]               Pt stable at time of discharge. Encouraged to return here if she develops worsening of symptoms, increase in pain, fever, or other concerning symptoms.    Wyn Forster, MD FMOB Fellow, Faculty practice [facility], Center for [facility]  06/11/2023   2:55 PM

## 2023-06-12 ENCOUNTER — Encounter: Payer: Self-pay | Admitting: Advanced Practice Midwife

## 2023-06-12 ENCOUNTER — Other Ambulatory Visit: Payer: Self-pay | Admitting: Advanced Practice Midwife

## 2023-06-12 ENCOUNTER — Other Ambulatory Visit (HOSPITAL_COMMUNITY): Payer: Self-pay | Admitting: Obstetrics and Gynecology

## 2023-06-12 LAB — CERVICOVAGINAL ANCILLARY ONLY
Bacterial Vaginitis (gardnerella): NEGATIVE
Candida Glabrata: NEGATIVE
Candida Vaginitis: POSITIVE — AB
Chlamydia: NEGATIVE
Comment: NEGATIVE
Comment: NEGATIVE
Comment: NEGATIVE
Comment: NEGATIVE
Comment: NORMAL
Neisseria Gonorrhea: NEGATIVE

## 2023-06-12 MED ORDER — FLUCONAZOLE 150 MG PO TABS: 150.0000 mg | ORAL_TABLET | Freq: Once | ORAL | 0 refills | Status: AC

## 2023-06-12 MED ORDER — SCOPOLAMINE 1 MG/3DAYS TD PT72
1.0000 | MEDICATED_PATCH | TRANSDERMAL | Status: DC
Start: 2023-06-12 — End: 2023-12-20

## 2023-06-12 MED ORDER — ONDANSETRON 4 MG PO TBDP: 4.0000 mg | ORAL_TABLET | Freq: Four times a day (QID) | ORAL | 0 refills | Status: DC | PRN

## 2023-06-12 NOTE — Progress Notes (Signed)
Vaginal swab positive for candida vaginitis, single dose diflucan sent to patient pharmacy.

## 2023-06-12 NOTE — Progress Notes (Signed)
Rx not sent at visit Will send Zofran, Diflucan and scopolamine

## 2023-07-10 ENCOUNTER — Encounter: Payer: Medicaid Other | Admitting: Obstetrics and Gynecology

## 2023-07-30 ENCOUNTER — Ambulatory Visit: Payer: Medicaid Other

## 2023-07-30 ENCOUNTER — Ambulatory Visit: Payer: Medicaid Other | Attending: Obstetrics and Gynecology

## 2023-09-29 ENCOUNTER — Inpatient Hospital Stay (HOSPITAL_COMMUNITY)
Admission: AD | Admit: 2023-09-29 | Discharge: 2023-09-29 | Disposition: A | Discharge status: Home / Self Care | Payer: Medicaid Other | Attending: Obstetrics and Gynecology | Admitting: Obstetrics and Gynecology

## 2023-09-29 ENCOUNTER — Encounter (HOSPITAL_COMMUNITY): Payer: Self-pay | Admitting: Obstetrics and Gynecology

## 2023-09-29 DIAGNOSIS — O26892 Other specified pregnancy related conditions, second trimester: Secondary | ICD-10-CM | POA: Diagnosis not present

## 2023-09-29 DIAGNOSIS — Z23 Encounter for immunization: Secondary | ICD-10-CM | POA: Diagnosis not present

## 2023-09-29 DIAGNOSIS — Z3A27 27 weeks gestation of pregnancy: Secondary | ICD-10-CM | POA: Diagnosis not present

## 2023-09-29 DIAGNOSIS — R350 Frequency of micturition: Secondary | ICD-10-CM | POA: Insufficient documentation

## 2023-09-29 DIAGNOSIS — O4692 Antepartum hemorrhage, unspecified, second trimester: Secondary | ICD-10-CM | POA: Diagnosis not present

## 2023-09-29 DIAGNOSIS — R1033 Periumbilical pain: Secondary | ICD-10-CM | POA: Diagnosis not present

## 2023-09-29 DIAGNOSIS — R102 Pelvic and perineal pain: Secondary | ICD-10-CM

## 2023-09-29 LAB — URINALYSIS, ROUTINE W REFLEX MICROSCOPIC
Bilirubin Urine: NEGATIVE
Glucose, UA: NEGATIVE mg/dL
Ketones, ur: NEGATIVE mg/dL
Nitrite: NEGATIVE
Protein, ur: NEGATIVE mg/dL
Specific Gravity, Urine: 1.024 (ref 1.005–1.030)
pH: 6 (ref 5.0–8.0)

## 2023-09-29 LAB — WET PREP, GENITAL
Clue Cells Wet Prep HPF POC: NONE SEEN
Sperm: NONE SEEN
Trich, Wet Prep: NONE SEEN
WBC, Wet Prep HPF POC: <10 (ref <10)
Yeast Wet Prep HPF POC: NONE SEEN

## 2023-09-29 LAB — ABO/RH
ABO/RH(D): O NEG
Antibody Screen: NEGATIVE

## 2023-09-29 MED ORDER — RHO D IMMUNE GLOBULIN 1500 UNIT/2ML IJ SOSY
300.0000 ug | PREFILLED_SYRINGE | Freq: Once | INTRAMUSCULAR | Status: AC
  Administered 2023-09-29: 300 ug via INTRAMUSCULAR
  Filled 2023-09-29: qty 2

## 2023-09-29 NOTE — MAU Note (Signed)
.  Peggy Mason is a 27 y.o. at [redacted]w[redacted]d here in MAU reporting: Light pink vaginal spotting noted this morning. She reports around 1630 she began experiencing intermittent lower abdominal tightening as well as mid lower back pain that feels like her "back is on fire." Denies vaginal discharge, vaginal odors, and vaginal itching. Denies LOF. +FM.  Onset of complaint: This morning  Pain score: 6/10 lower back "tolerable" 7/10 lower abdomen  Vitals:   09/29/23 1715  BP: 120/61  Pulse: 85  Resp: 16  Temp: 98.1 F (36.7 C)  SpO2: 100%      FHT: 135 initial external Lab orders placed from triage: UA

## 2023-09-29 NOTE — MAU Provider Note (Signed)
History     [id number]  Arrival date and time: 09/29/23 1700    Chief Complaint  Patient presents with   Abdominal Pain   Vaginal Bleeding     HPI Peggy Mason is a 27 y.o. at [redacted]w[redacted]d by LMP who presents for 1 day history of increased  abdominal tightness, pink discharge, and increased urinary frequency. She reports the tightness is worse when standing for long periods of time, and is constant. She feels she is more sensitive to fetal movement due to her abdominal tightness. She states it is different from contractions she had in prior pregnancies. At around 1pm, she began having to urinate every 1.5 hours, and noticed some pink discharge. She has not had pain with urination. She denies fever, chills, constipation, nausea/vomiting. She reports about 1 week of diarrhea.   Vaginal bleeding: Yes LOF: No Fetal Movement: Yes Contractions: No  --/--/O NEG (03/08 1010)  OB History     Gravida  6   Para  4   Term  4   Preterm  0   AB  1   Living  4      SAB  1   IAB  0   Ectopic  0   Multiple  0   Live Births  4           Past Medical History:  Diagnosis Date   Acne    Anemia    Headache    Herpes    History of chlamydia infection     Past Surgical History:  Procedure Laterality Date   DILATION AND EVACUATION N/A 12/20/2022   Procedure: DILATATION AND EVACUATION;  Surgeon: Warden Fillers, MD;  Location: [facility] OR;  Service: Gynecology;  Laterality: N/A;   HERNIA REPAIR     umbilical   WISDOM TOOTH EXTRACTION      Family History  Problem Relation Age of Onset   Hypertension Mother    Hypertension Father    Asthma Sister    Learning disabilities Sister    Kidney disease Sister        dialysis   Cancer Maternal Grandmother    Diabetes Paternal Grandmother     Social History   Socioeconomic History   Marital status: Single    Spouse name: Not on file   Number of children: 4   Years of education: Not on file   Highest education level: Not  on file  Occupational History   Not on file  Tobacco Use   Smoking status: Former    Types: Cigars    Quit date: 2020    Years since quitting: 4.9   Smokeless tobacco: Never   Tobacco comments:    not smoked since pregnant, passive smoker  Vaping Use   Vaping status: Former   Quit date: 12/11/2022  Substance and Sexual Activity   Alcohol use: Not Currently   Drug use: Not Currently    Types: Marijuana, Other-see comments    Comment: last smoked 10/2022, eats edibles   Sexual activity: Not Currently    Birth control/protection: None  Other Topics Concern   Not on file  Social History Narrative   Not on file   Social Drivers of Health   Financial Resource Strain: Not on file  Food Insecurity: Not on file  Transportation Needs: Not on file  Physical Activity: Not on file  Stress: Not on file  Social Connections: Not on file  Intimate Partner Violence: Not on file    Allergies  Allergen Reactions   Amoxicillin Hives   Penicillins Hives    Has patient had a PCN reaction causing immediate rash, facial/tongue/throat swelling, SOB or lightheadedness with hypotension: Yes Has patient had a PCN reaction causing severe rash involving mucus membranes or skin necrosis: No Has patient had a PCN reaction that required hospitalization Yes Has patient had a PCN reaction occurring within the last 10 years: No If all of the above answers are "NO", then may proceed with Cephalosporin use.    No current facility-administered medications on file prior to encounter.   Current Outpatient Medications on File Prior to Encounter  Medication Sig Dispense Refill   Blood Pressure Monitoring (BLOOD PRESSURE KIT) DEVI 1 Device by Does not apply route once a week. 1 each 0   ondansetron (ZOFRAN-ODT) 4 MG disintegrating tablet Take 1 tablet (4 mg total) by mouth every 6 (six) hours as needed for nausea. 20 tablet 0   Prenatal Vit-Fe Fumarate-FA (PRENATAL MULTIVITAMIN) TABS tablet Take 1 tablet by  mouth daily at 12 noon.     promethazine (PHENERGAN) 25 MG tablet Take 1 tablet (25 mg total) by mouth every 6 (six) hours as needed for nausea or vomiting. 30 tablet 1     Review of Systems  Constitutional:  Negative for chills and fever.  Gastrointestinal:  Positive for abdominal pain and diarrhea. Negative for constipation, nausea and vomiting.  Genitourinary:  Positive for frequency. Negative for dysuria and hematuria.   Pertinent positives and negative per HPI, all others reviewed and negative  Physical Exam   BP 120/61 (BP Location: Right Arm)   Pulse 85   Temp 98.1 F (36.7 C) (Oral)   Resp 16   Ht 5\' 3"  (1.6 m)   Wt 96.6 kg   LMP 10/26/2022   SpO2 100%   BMI 37.73 kg/m   Patient Vitals for the past 24 hrs:  BP Temp Temp src Pulse Resp SpO2 Height Weight  09/29/23 1715 120/61 98.1 F (36.7 C) Oral 85 16 100 % 5\' 3"  (1.6 m) 96.6 kg    Physical Exam Constitutional:      Appearance: Normal appearance.  HENT:     Head: Normocephalic and atraumatic.  Pulmonary:     Effort: Pulmonary effort is normal.  Abdominal:     Palpations: Abdomen is soft.     Tenderness: There is abdominal tenderness in the periumbilical area. There is no guarding.  Genitourinary:    Comments: Unremarkable speculum exam, no blood seen in vaginal vault Neurological:     Mental Status: She is alert.  Psychiatric:        Mood and Affect: Mood normal.        Behavior: Behavior normal.    Cervical Exam    Bedside Ultrasound Pt informed that the ultrasound is considered a limited OB ultrasound and is not intended to be a complete ultrasound exam.  Patient also informed that the ultrasound is not being completed with the intent of assessing for fetal or placental anomalies or any pelvic abnormalities.  Explained that the purpose of today's ultrasound is to assess for   placental location .  Patient acknowledges the purpose of the exam and the limitations of the study.    My interpretation:  Second/third trimester findings: Placenta localization: fundal. Normal cardiac and somatic movements, subjectively normal fluid  FHT Baseline: 140 bpm Variability: Good {> 6 bpm) Accelerations: reactive Decelerations: Absent Uterine activity: None Cat: I  Labs Results for orders placed or performed during the hospital  encounter of 09/29/23 (from the past 24 hours)  Urinalysis, Routine w reflex microscopic -Urine, Clean Catch     Status: Abnormal   Collection Time: 09/29/23  5:35 PM  Result Value Ref Range   Color, Urine YELLOW YELLOW   APPearance HAZY (A) CLEAR   Specific Gravity, Urine 1.024 1.005 - 1.030   pH 6.0 5.0 - 8.0   Glucose, UA NEGATIVE NEGATIVE mg/dL   Hgb urine dipstick SMALL (A) NEGATIVE   Bilirubin Urine NEGATIVE NEGATIVE   Ketones, ur NEGATIVE NEGATIVE mg/dL   Protein, ur NEGATIVE NEGATIVE mg/dL   Nitrite NEGATIVE NEGATIVE   Leukocytes,Ua TRACE (A) NEGATIVE   RBC / HPF 6-10 0 - 5 RBC/hpf   WBC, UA 6-10 0 - 5 WBC/hpf   Bacteria, UA RARE (A) NONE SEEN   Squamous Epithelial / HPF 6-10 0 - 5 /HPF   Mucus PRESENT     Imaging No results found.  MAU Course  Procedures  Lab Orders         Urinalysis, Routine w reflex microscopic -Urine, Clean Catch    No orders of the defined types were placed in this encounter.  Imaging Orders  No imaging studies ordered today    MDM Moderate (Level 3-4)  Assessment and Plan  #Abdominal pain in pregnancy, second trimester  #[redacted]w[redacted]d weeks gestation of pregnancy Patient reports one day of increased abdominal tightness and periumbilical pain, feels different from contractions in her previous pregnancies. No contractions on monitor. Likely due to advancing gestational age.   #Vaginal bleeding in pregnancy, second trimester Pink spotting when she wiped this morning, she has not had this in prior pregnancies. On [place], placenta fundal. No blood noted on speculum exam, wet prep unremarkable.   #Rh- Of note, patient is Rh-.  Rhogam given today as she does not recall receiving it in the office yet, she is due, and reports bleeding at home despite none seen on exam today.   #Increased urinary frequency UA negative for UTI, likely secondary to increased pelvic pressure in pregnancy.  #FWB FHT Cat I NST: Reactive   Dispo: discharged to home in stable condition    Nicholes Stairs, Medical Student/MPH 09/29/23 6:56 PM  Allergies as of 09/29/2023       Reactions   Amoxicillin Hives   Penicillins Hives   Has patient had a PCN reaction causing immediate rash, facial/tongue/throat swelling, SOB or lightheadedness with hypotension: Yes Has patient had a PCN reaction causing severe rash involving mucus membranes or skin necrosis: No Has patient had a PCN reaction that required hospitalization Yes Has patient had a PCN reaction occurring within the last 10 years: No If all of the above answers are "NO", then may proceed with Cephalosporin use.        Medication List     TAKE these medications    Blood Pressure Kit Devi 1 Device by Does not apply route once a week.   ondansetron 4 MG disintegrating tablet Commonly known as: ZOFRAN-ODT Take 1 tablet (4 mg total) by mouth every 6 (six) hours as needed for nausea.   prenatal multivitamin Tabs tablet Take 1 tablet by mouth daily at 12 noon.   promethazine 25 MG tablet Commonly known as: PHENERGAN Take 1 tablet (25 mg total) by mouth every 6 (six) hours as needed for nausea or vomiting.

## 2023-09-30 LAB — GC/CHLAMYDIA PROBE AMP (~~LOC~~) NOT AT ARMC
Chlamydia: NEGATIVE
Comment: NEGATIVE
Comment: NORMAL
Neisseria Gonorrhea: NEGATIVE

## 2023-09-30 LAB — RH IG WORKUP (INCLUDES ABO/RH)
Gestational Age(Wks): 27
Unit division: 0

## 2023-10-15 NOTE — L&D Delivery Note (Signed)
 Delivery Note    Patient Name: Peggy Mason DOB: 08/29/1996 MRN: [id number]  Date of admission: 12/18/2023 Delivering MD: Aviva Signs Date of delivery: 12/18/2023 Type of delivery: SVD  Newborn Data: Live born female  Birth Weight:   APGAR: ,   Newborn Delivery   Birth date/time: 12/18/2023 04:51:00 Delivery type: Vaginal, Spontaneous     [name], 28 y.o., @ [redacted]w[redacted]d,  [id number], who was admitted for acitve labor with gbs+ and no tx r/t precip delivery upon arrival to MAU. I was called to the room when she progressed and delivery a baby female in MAU by MAU arrivered then I took over.  She pushed for 2/min.  She delivered a viable infant, cephalic and restituted to the LOA position over an intact perineum.  A nuchal cord   was not identified. The baby was placed on maternal abdomen while initial step of NRP were perfmored (Dry, Stimulated, and warmed). Hat placed on baby for thermoregulation. Delayed cord clamping was performed for 2 minutes.  Cord double clamped and cut.  Cord cut by GM. Apgar scores were 8 and 9. Prophylactic Pitocin was started in the third stage of labor for active management. The placenta delivered spontaneously, shultz, with a 3 vessel cord and was sent to LD.  Inspection revealed 1st degree. An examination of the vaginal vault and cervix was free from lacerations. The uterus was firm, bleeding stable. No repir indicated, hemostatic. Marland Kitchen   Placenta and umbilical artery blood gas were not sent.  There were no complications during the procedure.  Mom and baby skin to skin following delivery. Left in stable condition.  Maternal Info: Anesthesia: None Episiotomy: no Lacerations:  1st hemostatic Suture Repair: no Est. Blood Loss (mL):   Newborn Info:  Baby Sex: female Circumcision: in pt   APGAR (1 MIN):   APGAR (5 MINS):   APGAR (10 MINS):     Mom to postpartum.  Baby to Couplet care / Skin to Skin.  Delivery Report:    Review the Delivery Report  for details.   [facility] CNM, FNP-C, PMHNP-BC  [address]  [place], [place] [address]  Cell: [phone]  Office Phone: [phone] Fax: [phone] 12/18/2023  7:56 AM

## 2023-11-11 ENCOUNTER — Encounter (HOSPITAL_BASED_OUTPATIENT_CLINIC_OR_DEPARTMENT_OTHER): Payer: Self-pay | Admitting: Emergency Medicine

## 2023-11-11 ENCOUNTER — Other Ambulatory Visit: Payer: Self-pay

## 2023-11-11 ENCOUNTER — Emergency Department (HOSPITAL_BASED_OUTPATIENT_CLINIC_OR_DEPARTMENT_OTHER)
Admission: EM | Admit: 2023-11-11 | Discharge: 2023-11-11 | Disposition: A | Discharge status: Home / Self Care | Payer: Medicaid Other | Attending: Emergency Medicine | Admitting: Emergency Medicine

## 2023-11-11 DIAGNOSIS — O99891 Other specified diseases and conditions complicating pregnancy: Secondary | ICD-10-CM | POA: Diagnosis not present

## 2023-11-11 DIAGNOSIS — O0993 Supervision of high risk pregnancy, unspecified, third trimester: Secondary | ICD-10-CM | POA: Diagnosis not present

## 2023-11-11 DIAGNOSIS — Z20822 Contact with and (suspected) exposure to covid-19: Secondary | ICD-10-CM | POA: Diagnosis not present

## 2023-11-11 DIAGNOSIS — Z3A33 33 weeks gestation of pregnancy: Secondary | ICD-10-CM | POA: Diagnosis not present

## 2023-11-11 DIAGNOSIS — O98513 Other viral diseases complicating pregnancy, third trimester: Secondary | ICD-10-CM | POA: Insufficient documentation

## 2023-11-11 DIAGNOSIS — R6889 Other general symptoms and signs: Secondary | ICD-10-CM | POA: Diagnosis not present

## 2023-11-11 LAB — RESP PANEL BY RT-PCR (RSV, FLU A&B, COVID)  RVPGX2
Influenza A by PCR: NEGATIVE
Influenza B by PCR: NEGATIVE
Resp Syncytial Virus by PCR: NEGATIVE
SARS Coronavirus 2 by RT PCR: NEGATIVE

## 2023-11-11 LAB — GROUP A STREP BY PCR: Group A Strep by PCR: NOT DETECTED

## 2023-11-11 MED ORDER — OSELTAMIVIR PHOSPHATE 75 MG PO CAPS: 75.0000 mg | ORAL_CAPSULE | Freq: Two times a day (BID) | ORAL | 0 refills | Status: DC

## 2023-11-11 NOTE — ED Triage Notes (Signed)
Children sick at home. Pt reports she has body aches-HA-chills-fatigue. Sore throat.

## 2023-11-11 NOTE — Discharge Instructions (Signed)
You were seen today.  Three of your children tested positive for the flu.  While your test was negative, I suspect her symptoms are related to flu.  Because you are pregnant, I will give you prescription for Tamiflu.  Make sure that you are staying hydrated.  Follow-up closely with your OB/GYN.

## 2023-11-11 NOTE — ED Provider Notes (Signed)
[address] EMERGENCY DEPARTMENT AT [facility] Provider Note   CSN: [id number] Arrival date & time: 11/11/23  2028     History  No chief complaint on file.   Peggy Mason is a 28 y.o. female.  HPI     This is a 28 year old female who presents with myalgias, chills, fatigue.  All 4 of her children have been sick and have had fevers at home.  They tested positive for influenza A.  She is currently [redacted] weeks pregnant.  Reports good fetal movement.  No loss of fluids.  No pain.  Has not taken her temperature at home.   Home Medications Prior to Admission medications   Medication Sig Start Date End Date Taking? Authorizing Provider  Blood Pressure Monitoring (BLOOD PRESSURE KIT) DEVI 1 Device by Does not apply route once a week. 06/04/23   Hermina Staggers, MD  ondansetron (ZOFRAN-ODT) 4 MG disintegrating tablet Take 1 tablet (4 mg total) by mouth every 6 (six) hours as needed for nausea. 06/12/23   Aviva Signs, CNM  Prenatal Vit-Fe Fumarate-FA (PRENATAL MULTIVITAMIN) TABS tablet Take 1 tablet by mouth daily at 12 noon.    [provider]  promethazine (PHENERGAN) 25 MG tablet Take 1 tablet (25 mg total) by mouth every 6 (six) hours as needed for nausea or vomiting. 06/04/23   Hermina Staggers, MD      Allergies    Amoxicillin and Penicillins    Review of Systems   Review of Systems  Constitutional:  Positive for chills and fatigue. Negative for fever.  HENT:  Positive for congestion.   Respiratory:  Negative for shortness of breath.   Cardiovascular:  Negative for chest pain.  Gastrointestinal:  Negative for abdominal pain.  Musculoskeletal:  Positive for myalgias.  All other systems reviewed and are negative.   Physical Exam Updated Vital Signs BP 113/76 (BP Location: Left Arm)   Pulse 99   Temp 99.1 F (37.3 C) (Oral)   Resp 18   LMP 10/26/2022   SpO2 100%  Physical Exam Vitals and nursing note reviewed.  Constitutional:      Appearance:  She is well-developed. She is not ill-appearing.  HENT:     Head: Normocephalic and atraumatic.     Nose: Congestion present.  Eyes:     Pupils: Pupils are equal, round, and reactive to light.  Cardiovascular:     Rate and Rhythm: Normal rate and regular rhythm.     Heart sounds: Normal heart sounds.  Pulmonary:     Effort: Pulmonary effort is normal. No respiratory distress.  Abdominal:     General: Bowel sounds are normal.     Palpations: Abdomen is soft.     Comments: Gravid  Musculoskeletal:     Cervical back: Neck supple.  Skin:    General: Skin is warm and dry.  Neurological:     Mental Status: She is alert and oriented to person, place, and time.  Psychiatric:        Mood and Affect: Mood normal.     ED Results / Procedures / Treatments   Labs (all labs ordered are listed, but only abnormal results are displayed) Labs Reviewed  RESP PANEL BY RT-PCR (RSV, FLU A&B, COVID)  [id number]  GROUP A STREP BY PCR    EKG None  Radiology No results found.  Procedures Procedures    Medications Ordered in ED Medications - No data to display  ED Course/ Medical Decision Making/ A&P  Medical Decision Making Risk Prescription drug management.   This patient presents to the ED for concern of upper respiratory symptoms, this involves an extensive number of treatment options, and is a complaint that carries with it a high risk of complications and morbidity.  I considered the following differential and admission for this acute, potentially life threatening condition.  The differential diagnosis includes viral illness such as COVID or influenza, pneumonia  MDM:    This is a 27 year old currently pregnant female who presents with upper respiratory symptoms.  She is nontoxic.  Vital signs are reassuring.  All of her children tested positive for influenza except for 1 who tested positive for COVID.  She has had symptoms for less than 1 day.  COVID  and influenza testing were obtained and are negative.  However, highly suspect she also has influenza given her symptoms and known sick contacts.  Given her pregnancy, we discussed Tamiflu.  She is agreeable to this plan.  Will have her follow-up with her OB/GYN.  (Labs, imaging, consults)  Labs: I Ordered, and personally interpreted labs.  The pertinent results include: COVID, influenza, strep  Imaging Studies ordered: I ordered imaging studies including none I independently visualized and interpreted imaging. I agree with the radiologist interpretation  Additional history obtained from chart review.  External records from outside source obtained and reviewed including prior evaluations  Cardiac Monitoring: The patient was not maintained on a cardiac monitor.  If on the cardiac monitor, I personally viewed and interpreted the cardiac monitored which showed an underlying rhythm of: N/A  Reevaluation: After the interventions noted above, I reevaluated the patient and found that they have :stayed the same  Social Determinants of Health:  lives independently  Disposition: Discharge  Co morbidities that complicate the patient evaluation  Past Medical History:  Diagnosis Date   Acne    Anemia    Headache    Herpes    History of chlamydia infection      Medicines Meds ordered this encounter  Medications   oseltamivir (TAMIFLU) 75 MG capsule    Sig: Take 1 capsule (75 mg total) by mouth every 12 (twelve) hours.    Dispense:  10 capsule    Refill:  0    I have reviewed the patients home medicines and have made adjustments as needed  Problem List / ED Course: Problem List Items Addressed This Visit   None Visit Diagnoses       Flu-like symptoms    -  Primary                   Final Clinical Impression(s) / ED Diagnoses Final diagnoses:  None    Rx / DC Orders ED Discharge Orders     None         Shon Baton, MD 11/11/23 2345

## 2023-12-18 ENCOUNTER — Inpatient Hospital Stay (HOSPITAL_COMMUNITY)
Admission: AD | Admit: 2023-12-18 | Discharge: 2023-12-20 | DRG: 806 | Disposition: A | Discharge status: Home / Self Care | Attending: Obstetrics & Gynecology | Admitting: Obstetrics & Gynecology

## 2023-12-18 ENCOUNTER — Encounter (HOSPITAL_COMMUNITY): Payer: Self-pay | Admitting: Obstetrics & Gynecology

## 2023-12-18 DIAGNOSIS — O26893 Other specified pregnancy related conditions, third trimester: Secondary | ICD-10-CM | POA: Diagnosis present

## 2023-12-18 DIAGNOSIS — O9832 Other infections with a predominantly sexual mode of transmission complicating childbirth: Secondary | ICD-10-CM | POA: Diagnosis present

## 2023-12-18 DIAGNOSIS — O99824 Streptococcus B carrier state complicating childbirth: Secondary | ICD-10-CM | POA: Diagnosis present

## 2023-12-18 DIAGNOSIS — A6000 Herpesviral infection of urogenital system, unspecified: Secondary | ICD-10-CM | POA: Diagnosis present

## 2023-12-18 DIAGNOSIS — Z87891 Personal history of nicotine dependence: Secondary | ICD-10-CM

## 2023-12-18 DIAGNOSIS — Z880 Allergy status to penicillin: Secondary | ICD-10-CM | POA: Diagnosis not present

## 2023-12-18 DIAGNOSIS — Z3A39 39 weeks gestation of pregnancy: Secondary | ICD-10-CM

## 2023-12-18 MED ORDER — COCONUT OIL OIL: 1.0000 | TOPICAL_OIL | Status: DC | PRN

## 2023-12-18 MED ORDER — DIPHENHYDRAMINE HCL 25 MG PO CAPS: 25.0000 mg | ORAL_CAPSULE | Freq: Four times a day (QID) | ORAL | Status: DC | PRN

## 2023-12-18 MED ORDER — IBUPROFEN 600 MG PO TABS
600.0000 mg | ORAL_TABLET | Freq: Four times a day (QID) | ORAL | Status: DC
  Administered 2023-12-18 – 2023-12-20 (×7): 600 mg via ORAL
  Filled 2023-12-18 (×7): qty 1

## 2023-12-18 MED ORDER — ONDANSETRON HCL 4 MG/2ML IJ SOLN: 4.0000 mg | INTRAMUSCULAR | Status: DC | PRN

## 2023-12-18 MED ORDER — ERYTHROMYCIN 5 MG/GM OP OINT
TOPICAL_OINTMENT | OPHTHALMIC | Status: AC
  Filled 2023-12-18: qty 1

## 2023-12-18 MED ORDER — SIMETHICONE 80 MG PO CHEW: 80.0000 mg | CHEWABLE_TABLET | ORAL | Status: DC | PRN

## 2023-12-18 MED ORDER — WITCH HAZEL-GLYCERIN EX PADS: 1.0000 | MEDICATED_PAD | CUTANEOUS | Status: DC | PRN

## 2023-12-18 MED ORDER — ACETAMINOPHEN 325 MG PO TABS: 650.0000 mg | ORAL_TABLET | ORAL | Status: DC | PRN

## 2023-12-18 MED ORDER — DIBUCAINE (PERIANAL) 1 % EX OINT: 1.0000 | TOPICAL_OINTMENT | CUTANEOUS | Status: DC | PRN

## 2023-12-18 MED ORDER — TETANUS-DIPHTH-ACELL PERTUSSIS 5-2.5-18.5 LF-MCG/0.5 IM SUSY: 0.5000 mL | PREFILLED_SYRINGE | Freq: Once | INTRAMUSCULAR | Status: DC

## 2023-12-18 MED ORDER — OXYTOCIN 10 UNIT/ML IJ SOLN
10.0000 [IU] | Freq: Once | INTRAMUSCULAR | Status: AC
  Administered 2023-12-18: 10 [IU] via INTRAMUSCULAR

## 2023-12-18 MED ORDER — ONDANSETRON HCL 4 MG PO TABS: 4.0000 mg | ORAL_TABLET | ORAL | Status: DC | PRN

## 2023-12-18 MED ORDER — SENNOSIDES-DOCUSATE SODIUM 8.6-50 MG PO TABS
2.0000 | ORAL_TABLET | Freq: Every day | ORAL | Status: DC
  Filled 2023-12-18: qty 2

## 2023-12-18 MED ORDER — OXYTOCIN 10 UNIT/ML IJ SOLN
INTRAMUSCULAR | Status: AC
  Filled 2023-12-18: qty 1

## 2023-12-18 MED ORDER — ZOLPIDEM TARTRATE 5 MG PO TABS: 5.0000 mg | ORAL_TABLET | Freq: Every evening | ORAL | Status: DC | PRN

## 2023-12-18 MED ORDER — BENZOCAINE-MENTHOL 20-0.5 % EX AERO: 1.0000 | INHALATION_SPRAY | CUTANEOUS | Status: DC | PRN

## 2023-12-18 MED ORDER — PRENATAL MULTIVITAMIN CH
1.0000 | ORAL_TABLET | Freq: Every day | ORAL | Status: DC
  Administered 2023-12-18 – 2023-12-19 (×2): 1 via ORAL
  Filled 2023-12-18 (×2): qty 1

## 2023-12-18 NOTE — H&P (Signed)
 Peggy Mason is a 28 y.o. female, [id number], IUP at 39.1 weeks, presenting for Active labor. GBS+. PCN allergy. RH-. HSV+Pt endorse + Fm. Denies vaginal leakage. Denies vaginal bleeding.   Patient Active Problem List   Diagnosis Date Noted   Normal labor and delivery 12/18/2023   Supervision of other normal pregnancy, antepartum 06/04/2023   Retained products of conception following abortion 12/20/2022   Incomplete miscarriage 12/20/2022   Normal labor 04/19/2022   Chronic headache disorder 09/28/2021   Genital herpes simplex 08/20/2018   Susceptible varicella--non-immune 08/15/2015   Rh negative, maternal - received Rhophylac on 08/04/15 due to spotting 08/14/2015   Rubella non-immune status, antepartum 08/14/2015   Allergy to penicillin - rash, hives -- required hospitalization 08/14/2015   Allergy to amoxicillin -- rash, hives 08/14/2015     Active Ambulatory Problems    Diagnosis Date Noted   Rh negative, maternal - received Rhophylac on 08/04/15 due to spotting 08/14/2015   Rubella non-immune status, antepartum 08/14/2015   Allergy to penicillin - rash, hives -- required hospitalization 08/14/2015   Allergy to amoxicillin -- rash, hives 08/14/2015   Susceptible varicella--non-immune 08/15/2015   Genital herpes simplex 08/20/2018   Chronic headache disorder 09/28/2021   Normal labor 04/19/2022   Retained products of conception following abortion 12/20/2022   Incomplete miscarriage 12/20/2022   Supervision of other normal pregnancy, antepartum 06/04/2023   Resolved Ambulatory Problems    Diagnosis Date Noted   Hyperemesis affecting pregnancy, antepartum 08/15/2015   Subchorionic hemorrhage 08/15/2015   Vitamin D deficiency 08/15/2015   Intrauterine normal pregnancy 03/30/2016   SVD (spontaneous vaginal delivery) 03/30/2016   Acute viral syndrome 02/26/2017   Labor and delivery, indication for care 03/11/2019   Normal postpartum course 03/13/2019   Past Medical  History:  Diagnosis Date   Acne    Anemia    Headache    Herpes    History of chlamydia infection       Facility-Administered Medications Prior to Admission  Medication Dose Route Frequency Provider Last Rate Last Admin   scopolamine (TRANSDERM-SCOP) 1 MG/3DAYS 1.5 mg  1 patch Transdermal Q72H        Medications Prior to Admission  Medication Sig Dispense Refill Last Dose/Taking   Blood Pressure Monitoring (BLOOD PRESSURE KIT) DEVI 1 Device by Does not apply route once a week. 1 each 0    ondansetron (ZOFRAN-ODT) 4 MG disintegrating tablet Take 1 tablet (4 mg total) by mouth every 6 (six) hours as needed for nausea. 20 tablet 0    oseltamivir (TAMIFLU) 75 MG capsule Take 1 capsule (75 mg total) by mouth every 12 (twelve) hours. 10 capsule 0    Prenatal Vit-Fe Fumarate-FA (PRENATAL MULTIVITAMIN) TABS tablet Take 1 tablet by mouth daily at 12 noon.      promethazine (PHENERGAN) 25 MG tablet Take 1 tablet (25 mg total) by mouth every 6 (six) hours as needed for nausea or vomiting. 30 tablet 1     Past Medical History:  Diagnosis Date   Acne    Anemia    Headache    Herpes    History of chlamydia infection      No current facility-administered medications on file prior to encounter.   Current Outpatient Medications on File Prior to Encounter  Medication Sig Dispense Refill   Blood Pressure Monitoring (BLOOD PRESSURE KIT) DEVI 1 Device by Does not apply route once a week. 1 each 0   ondansetron (ZOFRAN-ODT) 4 MG disintegrating tablet Take 1  tablet (4 mg total) by mouth every 6 (six) hours as needed for nausea. 20 tablet 0   oseltamivir (TAMIFLU) 75 MG capsule Take 1 capsule (75 mg total) by mouth every 12 (twelve) hours. 10 capsule 0   Prenatal Vit-Fe Fumarate-FA (PRENATAL MULTIVITAMIN) TABS tablet Take 1 tablet by mouth daily at 12 noon.     promethazine (PHENERGAN) 25 MG tablet Take 1 tablet (25 mg total) by mouth every 6 (six) hours as needed for nausea or vomiting. 30 tablet 1      Allergies  Allergen Reactions   Amoxicillin Hives   Penicillins Hives    Has patient had a PCN reaction causing immediate rash, facial/tongue/throat swelling, SOB or lightheadedness with hypotension: Yes Has patient had a PCN reaction causing severe rash involving mucus membranes or skin necrosis: No Has patient had a PCN reaction that required hospitalization Yes Has patient had a PCN reaction occurring within the last 10 years: No If all of the above answers are "NO", then may proceed with Cephalosporin use.    History of present pregnancy: Pt Info/Preference:  Screening/Consents:  Labs:   EDD: Estimated Date of Delivery: 12/24/23  Establised: Patient's last menstrual period was 10/26/2022.  Anatomy Scan: Date: 08/07/2023 Placenta Location: posterior Genetic Screen: Panoroma:LR AFP:  First Tri: Quad:  Office: [facility]             First PNV: 13 weeks Blood Type --/--/O NEG (12/16 2006)  Language: english Last PNV: 38.2 weeks Rhogam  10/19/2023  Flu Vaccine:  declined   Antibody NEG Performed at [facility], [address], [place], [place] [address]  (12/16 2006)  TDaP vaccine utd   GTT: Early: 5.7 Third Trimester: 91  Feeding Plan: Breast/bottle BTL: Desires PP consent signed 09/04/2023 Rubella:  IMMUNE  Contraception: BTL PP VBAC: no RPR:   NR  Circumcision: ???   HBsAg:  Neg  Pediatrician:  ???   HIV:   NR  Prenatal Classes: no Additional [place]: no GBS:  POsitive(For PCN allergy, check sensitivities)       Chlamydia: neg    MFM Referral/Consult:  GC: neg  Support Person: mother   PAP: ???  Pain Management: natural Neonatologist Referral:  Hgb Electrophoresis:  AA  Birth Plan: DCC   Hgb NOB: 12.6    28W: 10.9   OB History     Gravida  6   Para  4   Term  4   Preterm  0   AB  1   Living  4      SAB  1   IAB  0   Ectopic  0   Multiple  0   Live Births  4          Past Medical History:  Diagnosis Date   Acne    Anemia    Headache    Herpes     History of chlamydia infection    Past Surgical History:  Procedure Laterality Date   DILATION AND EVACUATION N/A 12/20/2022   Procedure: DILATATION AND EVACUATION;  Surgeon: Warden Fillers, MD;  Location: [facility] OR;  Service: Gynecology;  Laterality: N/A;   HERNIA REPAIR     umbilical   WISDOM TOOTH EXTRACTION     Family History: family history includes Asthma in her sister; Cancer in her maternal grandmother; Diabetes in her paternal grandmother; Hypertension in her father and mother; Kidney disease in her sister; Learning disabilities in her sister. Social History:  reports that she quit  smoking about 5 years ago. Her smoking use included cigars. She has never used smokeless tobacco. She reports that she does not currently use alcohol. She reports that she does not currently use drugs after having used the following drugs: Marijuana and Other-see comments.   Prenatal Transfer Tool  Maternal Diabetes: No Genetic Screening: Normal Maternal Ultrasounds/Referrals: Normal Fetal Ultrasounds or other Referrals:  None Maternal Substance Abuse:  No Significant Maternal Medications:  None Significant Maternal Lab Results: Group B Strep positive and Other: HSV +  ROS:  Review of Systems  Constitutional: Negative.   HENT: Negative.    Eyes: Negative.   Respiratory: Negative.    Cardiovascular: Negative.   Gastrointestinal:  Positive for abdominal pain.  Genitourinary: Negative.   Musculoskeletal: Negative.   Skin: Negative.   Neurological: Negative.   Endo/Heme/Allergies: Negative.   Psychiatric/Behavioral: Negative.       Physical Exam: BP 120/62   Pulse (!) 132   Temp 98.1 F (36.7 C) (Oral)   LMP 10/26/2022   SpO2 100%   Physical Exam Vitals and nursing note reviewed.  Constitutional:      Appearance: Normal appearance.  HENT:     Head: Normocephalic.     Nose: Nose normal.     Mouth/Throat:     Pharynx: Oropharynx is clear.  Eyes:     Conjunctiva/sclera: Conjunctivae  normal.  Cardiovascular:     Rate and Rhythm: Normal rate and regular rhythm.     Pulses: Normal pulses.     Heart sounds: Normal heart sounds.  Pulmonary:     Effort: Pulmonary effort is normal.     Breath sounds: Normal breath sounds.  Abdominal:     General: Bowel sounds are normal.  Genitourinary:    General: Normal vulva.     Rectum: Normal.     Comments: No lesion noted.  Musculoskeletal:        General: Normal range of motion.     Cervical back: Normal range of motion and neck supple.  Skin:    General: Skin is warm.     Capillary Refill: Capillary refill takes less than 2 seconds.  Neurological:     General: No focal deficit present.     Mental Status: She is alert.  Psychiatric:        Mood and Affect: Mood normal.      NST: FHR baseline 125 bpm, Variability: moderate, Accelerations:present, Decelerations:  Absent= Cat 1/Reactive UC:   regular, every 2-3 minutes SVE:   Dilation: Lip/rim Effacement (%): 100 Exam by:: Artelia Laroche CNM, vertex verified by fetal sutures.  Leopold's: Position vertex, EFW 7lbs via leopold's.   Labs: No results found for this or any previous visit (from the past 24 hours).  Imaging:  No results found.  MAU Course: Orders Placed This Encounter  Procedures   Cervical Exam   Insert urethral catheter X 1 PRN If Coude Catheter is chosen, qualified resources by campus can be found in the clinical skills nursing procedure for Coude Catheter 1. If straight catheterized > 2 times or patient unable to void post epidural plac...   Cervical Exam   Insert urethral catheter X 1 PRN If Coude Catheter is chosen, qualified resources by campus can be found in the clinical skills nursing procedure for Coude Catheter 1. If straight catheterized > 2 times or patient unable to void post epidural plac...   Admit to Inpatient (patient's expected length of stay will be greater than 2 midnights or inpatient only procedure)  Meds ordered this encounter   Medications   oxytocin (PITOCIN) injection 10 Units   erythromycin 5 MG/GM ophthalmic ointment    Cox, Deztiny J: cabinet override   oxytocin (PITOCIN) 10 UNIT/ML injection    Cox, Deztiny J: cabinet override    Assessment/Plan: SABRIN DUNLEVY is a 28 y.o. female, [id number], IUP at 39.1 weeks, presenting for Active labor. GBS+. PCN allergy. RH-. HSV+Pt endorse + Fm. Denies vaginal leakage. Denies vaginal bleeding.  FWB: Cat 1 Fetal Tracing.   Plan: Admit to Birthing Suite per Routine [facility] orders Pain med/epidural prn PCN G for GBS prophylaxis  Anticipate labor progression  Desires PP consent signed 09/04/2023  [facility] CNM, FNP-C, PMHNP-BC  [address]  [place], [place] [address]  Cell: [phone]  Office Phone: [phone] Fax: [phone] 12/18/2023  5:22 AM

## 2023-12-18 NOTE — MAU Note (Signed)
 Called from valet circle, RN's to circle with Artelia Laroche, CNM.  Patient in passenger seat reporting contractions every minute and pressure. CNM assessed and patient desired to get on wheelchair and to room before SVE. RN assisted patient to wheelchair and patient brought to 1S30.  FHR 131  0442 CNM at bedside performed SVE 9.5 cm with bulging bag.  0442 [facility] called for admission orders.  1478 LD called for room assignment, unable to give room at this time. 56 Ana, RN reports SROM  0451 M. Mayford Knife, CNM at bedside for delivery.

## 2023-12-19 ENCOUNTER — Encounter (HOSPITAL_COMMUNITY)
Admission: AD | Disposition: A | Discharge status: Home / Self Care | Payer: Self-pay | Source: Home / Self Care | Attending: Obstetrics & Gynecology

## 2023-12-19 ENCOUNTER — Inpatient Hospital Stay (HOSPITAL_COMMUNITY): Admission: RE | Admit: 2023-12-19 | Source: Home / Self Care | Admitting: Obstetrics & Gynecology

## 2023-12-19 LAB — CBC
HCT: 31.2 % — ABNORMAL LOW (ref 36.0–46.0)
Hemoglobin: 10.2 g/dL — ABNORMAL LOW (ref 12.0–15.0)
MCH: 27.4 pg (ref 26.0–34.0)
MCHC: 32.7 g/dL (ref 30.0–36.0)
MCV: 83.9 fL (ref 80.0–100.0)
Platelets: 308 10*3/uL (ref 150–400)
RBC: 3.72 MIL/uL — ABNORMAL LOW (ref 3.87–5.11)
RDW: 14.6 % (ref 11.5–15.5)
WBC: 11.7 10*3/uL — ABNORMAL HIGH (ref 4.0–10.5)
nRBC: 0 % (ref 0.0–0.2)

## 2023-12-19 LAB — TYPE AND SCREEN
ABO/RH(D): O NEG
Antibody Screen: NEGATIVE

## 2023-12-19 LAB — RPR: RPR Ser Ql: NONREACTIVE

## 2023-12-19 SURGERY — LIGATION, FALLOPIAN TUBE, POSTPARTUM
Anesthesia: Choice | Laterality: Bilateral

## 2023-12-19 MED ORDER — RHO D IMMUNE GLOBULIN 1500 UNIT/2ML IJ SOSY
300.0000 ug | PREFILLED_SYRINGE | Freq: Once | INTRAMUSCULAR | Status: AC
  Administered 2023-12-19: 300 ug via INTRAMUSCULAR
  Filled 2023-12-19: qty 2

## 2023-12-19 NOTE — Progress Notes (Addendum)
 Post Partum Day 1 Subjective: no complaints, up ad lib, voiding, and tolerating PO  Objective: Blood pressure 119/69, pulse 81, temperature 98.9 F (37.2 C), temperature source Oral, resp. rate 18, last menstrual period 10/26/2022, SpO2 99%, unknown if currently breastfeeding.  Physical Exam:  General: alert, cooperative, and no distress Lochia: appropriate Uterine Fundus: firm Incision: N/A DVT Evaluation: No evidence of DVT seen on physical exam. Calf/Ankle edema is present.  Recent Labs    12/19/23 0526  HGB 10.2*  HCT 31.2*    Assessment/Plan: 28 y/o [id number] PPD # 1, - Plan for discharge tomorrow, Circumcision prior to discharge, and Contraception will be interval tubal ligation. - Patient with a history of umbilical hernia surgery, will defer tubal ligation today and follow up on this in 2 months by laparoscopy. She may also be interested in an IUD. - Discussed with patient risks, benefits and alternatives of circumcision of her newborn.   LOS: 1 day   Prescilla Sours, MD 12/19/2023, 4:45 PM

## 2023-12-19 NOTE — Lactation Note (Signed)
 This note was copied from a baby's chart. Lactation Consultation Note  Patient Name: Peggy Mason [id number] Date: 12/19/2023 Age:30 hours, P5 , experienced BF  Reason for consult: Initial assessment;Term (5 % weight loss, Nursery nurse into take baby for a circ) According to the doc flow sheets , baby last fed at 7 am. Per mom baby latches well well 30 -45 min at a time, swallows noted.  See below for details.   Maternal Data Has patient been taught Hand Expression?:  (mom is experienced BF) Does the patient have breastfeeding experience prior to this delivery?: Yes How long did the patient breastfeed?: per  mom BF 4 babies up to 1 year  Feeding Mother's Current Feeding Choice: Breast Milk  LATCH Score - none recorded as of yet.  LC unable to assess latch at this consult , baby having a circ.  LC encouraged mom to call when baby was ready to feed for Latch assessment.     Lactation Tools Discussed/Used Tools: Pump;Flanges Flange Size: 18;21 Breast pump type: Manual Pump Education: Milk Storage;Setup, frequency, and cleaning Reason for Pumping: PRN - preparing for D/C and Flange check  Interventions Interventions: Breast feeding basics reviewed;Hand pump;Education;CDC Guidelines for Breast Pump Cleaning;CDC milk storage guidelines  Discharge Discharge Education: Engorgement and breast care Pump: Personal;Hands Free;Manual  Consult Status Consult Status: Follow-up Date: 12/19/23 Follow-up type: In-patient    Peggy Mason 12/19/2023, 8:22 AM

## 2023-12-20 ENCOUNTER — Ambulatory Visit (HOSPITAL_COMMUNITY): Payer: Self-pay

## 2023-12-20 LAB — RH IG WORKUP (INCLUDES ABO/RH)
Fetal Screen: NEGATIVE
Gestational Age(Wks): 39.1
Unit division: 0

## 2023-12-20 MED ORDER — ACETAMINOPHEN 325 MG PO TABS: 650.0000 mg | ORAL_TABLET | ORAL | Status: AC | PRN

## 2023-12-20 MED ORDER — IBUPROFEN 600 MG PO TABS: 600.0000 mg | ORAL_TABLET | Freq: Four times a day (QID) | ORAL | 0 refills | Status: DC

## 2023-12-20 NOTE — Discharge Summary (Signed)
 Postpartum Discharge Summary  Date of Service updated 12/20/23    Patient Name: Peggy Mason DOB: 11/30/1995 MRN: [id number]  Date of admission: 12/18/2023 Delivery date:12/18/2023 Delivering provider: Aviva Signs Date of discharge: 12/20/2023  Admitting diagnosis: Normal labor and delivery [O80] Intrauterine pregnancy: [redacted]w[redacted]d     Secondary diagnosis:  Principal Problem:   Normal labor and delivery  Additional problems: none    Discharge diagnosis: Term Pregnancy Delivered                                              Post partum procedures: none Augmentation: N/A Complications: None  Hospital course: Onset of Labor With Vaginal Delivery      28 y.o. yo [id number] at [redacted]w[redacted]d was admitted in Active Labor on 12/18/2023. Precipitous labor and birth. Birth occurred in MAU shortly after arrival to MAU. Labor course was uncomplicated  Membrane Rupture Time/Date: 4:50 AM,12/18/2023  Delivery Method:Vaginal, Spontaneous Operative Delivery:N/A Episiotomy: None Lacerations:  1st degree Patient had a postpartum course complicated by NONE.  She is ambulating, tolerating a regular diet, passing flatus, and urinating well. Patient is discharged home in stable condition on 12/20/23.  Newborn Data: Birth date:12/18/2023 Birth time:4:51 AM Gender:Female Living status:Living Apgars:10 ,10  Weight:2915 g  Magnesium Sulfate received: No BMZ received: No Rhophylac:N/A MMR:N/A T-DaP:Given prenatally Flu: No RSV Vaccine received: No Transfusion:No Immunizations administered: Immunization History  Administered Date(s) Administered   Hepatitis A 04/27/2008   Influenza Split 07/30/2012   MMR 04/20/2022   PPD Test 09/22/2012   Rho (D) Immune Globulin 06/03/2012   Tdap 07/07/2012, 04/01/2016   Varicella 04/19/2011    Physical exam  Vitals:   12/19/23 0719 12/19/23 1529 12/19/23 2154 12/20/23 0610  BP: (!) (P) 110/56 119/69 116/75 118/80  Pulse: (P) 85 81 87 85  Resp:  18 18 16   Temp:  (P) 98.8 F (37.1 C) 98.9 F (37.2 C) 98.3 F (36.8 C) 98.1 F (36.7 C)  TempSrc: (P) Oral Oral Oral Oral  SpO2:  99% 100% 100%   General: alert, cooperative, and no distress Lochia: appropriate Uterine Fundus: firm Incision: N/A DVT Evaluation: No evidence of DVT seen on physical exam. No cords or calf tenderness. No significant calf/ankle edema. Labs: Lab Results  Component Value Date   WBC 11.7 (H) 12/19/2023   HGB 10.2 (L) 12/19/2023   HCT 31.2 (L) 12/19/2023   MCV 83.9 12/19/2023   PLT 308 12/19/2023      Latest Ref Rng & Units 12/18/2022   12:55 PM  CMP  Glucose 70 - 99 mg/dL 89   BUN 6 - 20 mg/dL 9   Creatinine 1.30 - 8.65 mg/dL 7.84   Sodium 696 - 295 mmol/L 137   Potassium 3.5 - 5.1 mmol/L 3.8   Chloride 98 - 111 mmol/L 105   CO2 22 - 32 mmol/L 21   Calcium 8.9 - 10.3 mg/dL 8.7   Total Protein 6.5 - 8.1 g/dL 6.9   Total Bilirubin 0.3 - 1.2 mg/dL 0.5   Alkaline Phos 38 - 126 U/L 118   AST 15 - 41 U/L 33   ALT 0 - 44 U/L 35    Edinburgh Score:    12/19/2023    5:05 PM  Edinburgh Postnatal Depression Scale Screening Tool  I have been able to laugh and see the funny side of things. 0  I have looked forward with enjoyment to things. 0  I have blamed myself unnecessarily when things went wrong. 0  I have been anxious or worried for no good reason. 0  I have felt scared or panicky for no good reason. 0  Things have been getting on top of me. 1  I have been so unhappy that I have had difficulty sleeping. 0  I have felt sad or miserable. 1  I have been so unhappy that I have been crying. 0  The thought of harming myself has occurred to me. 0  Edinburgh Postnatal Depression Scale Total 2      After visit meds:  Allergies as of 12/20/2023       Reactions   Amoxicillin Hives   Penicillins Hives   Has patient had a PCN reaction causing immediate rash, facial/tongue/throat swelling, SOB or lightheadedness with hypotension: Yes Has patient had a PCN reaction  causing severe rash involving mucus membranes or skin necrosis: No Has patient had a PCN reaction that required hospitalization Yes Has patient had a PCN reaction occurring within the last 10 years: No If all of the above answers are "NO", then may proceed with Cephalosporin use.        Medication List     STOP taking these medications    oseltamivir 75 MG capsule Commonly known as: TAMIFLU       TAKE these medications    acetaminophen 325 MG tablet Commonly known as: Tylenol Take 2 tablets (650 mg total) by mouth every 4 (four) hours as needed (for pain scale < 4).   Blood Pressure Kit Devi 1 Device by Does not apply route once a week.   ibuprofen 600 MG tablet Commonly known as: ADVIL Take 1 tablet (600 mg total) by mouth every 6 (six) hours.   ondansetron 4 MG disintegrating tablet Commonly known as: ZOFRAN-ODT Take 1 tablet (4 mg total) by mouth every 6 (six) hours as needed for nausea.   prenatal multivitamin Tabs tablet Take 1 tablet by mouth daily at 12 noon.   promethazine 25 MG tablet Commonly known as: PHENERGAN Take 1 tablet (25 mg total) by mouth every 6 (six) hours as needed for nausea or vomiting.         Discharge home in stable condition Infant Feeding: Breast Infant Disposition:home with mother Discharge instruction: per After Visit Summary and Postpartum booklet. Activity: Advance as tolerated. Pelvic rest for 6 weeks.  Diet: routine diet Anticipated Birth Control: Plans Interval BTL Postpartum Appointment:6 weeks Additional Postpartum F/U:  none Future Appointments:No future appointments. Follow up Visit:  Follow-up Information     [facility]. Schedule an appointment as soon as possible for a visit in 6 week(s).   Specialty: Obstetrics and Gynecology Contact information: [address] [place] [address] [phone]                    12/20/2023 Roma Schanz, CNM

## 2023-12-20 NOTE — Lactation Note (Signed)
 This note was copied from a baby's chart. Lactation Consultation Note  Patient Name: Peggy Mason Date: 12/20/2023 Age:30 hours Reason for consult: Follow-up assessment;Term;Infant weight loss;Breastfeeding assistance As LC entered the room the baby latched with depth and swallows ( see below for details .  LC reviewed BF D/C teaching and the [facility] resources.     Maternal Data    Feeding Mother's Current Feeding Choice: Breast Milk  LATCH Score Latch:  (latched with depth)  Audible Swallowing:  (multiple swallows noted)     Comfort (Breast/Nipple):  (after  LC adjusted the baby latched , per mom more comfortable)     Lactation Tools Discussed/Used Tools: Pump;Flanges Flange Size: 21;18 Breast pump type: Manual Pump Education: Milk Storage;Setup, frequency, and cleaning  Interventions Interventions: Breast feeding basics reviewed;Adjust position;Hand pump;Education;LC Services brochure;CDC Guidelines for Breast Pump Cleaning;CDC milk storage guidelines  Discharge Discharge Education: Engorgement and breast care;Warning signs for feeding baby Pump: Personal;Hands Free;Manual  Consult Status Consult Status: Complete Date: 12/20/23    Kathrin Greathouse 12/20/2023, 11:26 AM

## 2023-12-27 ENCOUNTER — Telehealth (HOSPITAL_COMMUNITY): Payer: Self-pay

## 2023-12-27 NOTE — Telephone Encounter (Signed)
 12/27/2023 1835  Name: Peggy Mason MRN: [id number] DOB: 04/04/1996  Reason for Call:  Transition of Care Hospital Discharge Call  Contact Status: Patient Contact Status: Complete  Language assistant needed:          Follow-Up Questions: Do You Have Any Concerns About Your Health As You Heal From Delivery?: No Do You Have Any Concerns About Your Infants Health?: No  Edinburgh Postnatal Depression Scale:  In the Past 7 Days:    PHQ2-9 Depression Scale:     Discharge Follow-up: Edinburgh score requires follow up?:  (Patient states she is at a birthday party and it is not a good time to do EPDS. She states that she will call back to complete when it is a better time for her.)  Post-discharge interventions: Reviewed Newborn Safe Sleep Practices  Signature  Signe Colt

## 2024-01-01 ENCOUNTER — Other Ambulatory Visit: Payer: Self-pay | Admitting: Obstetrics & Gynecology

## 2024-01-16 ENCOUNTER — Other Ambulatory Visit: Payer: Self-pay | Admitting: Obstetrics & Gynecology

## 2024-02-10 ENCOUNTER — Encounter: Payer: Self-pay | Admitting: Neurology

## 2024-03-11 ENCOUNTER — Encounter: Payer: Self-pay | Admitting: Lactation Services

## 2024-04-02 ENCOUNTER — Ambulatory Visit (HOSPITAL_COMMUNITY): Admit: 2024-04-02 | Admitting: Obstetrics & Gynecology

## 2024-04-02 SURGERY — SALPINGECTOMY, ROBOT-ASSISTED
Anesthesia: General | Laterality: Bilateral

## 2024-04-02 NOTE — Progress Notes (Deleted)
 Initial neurology clinic note  Peggy Mason MRN: [id number] DOB: [birth year]  Referring provider: Vernal Gold, MD  Primary care provider: Pcp, No  Reason for consult:  headaches  Subjective:  This is [name], a 28 y.o. ***-handed female with a medical history of vit D deficiency, anemia, genital herpes*** who presents to neurology clinic with ***. The patient is accompanied by ***.  *** Recurrent headaches - not much information  Pregnant? Not clear per notes  Smoker: OCP use: Caffiene use: EtOH use: Restrictive diet: Family history:   MEDICATIONS:  Outpatient Encounter Medications as of 04/14/2024  Medication Sig   acetaminophen  (TYLENOL ) 325 MG tablet Take 2 tablets (650 mg total) by mouth every 4 (four) hours as needed (for pain scale < 4).   Blood Pressure Monitoring (BLOOD PRESSURE KIT) DEVI 1 Device by Does not apply route once a week.   ibuprofen  (ADVIL ) 600 MG tablet Take 1 tablet (600 mg total) by mouth every 6 (six) hours.   ondansetron  (ZOFRAN -ODT) 4 MG disintegrating tablet Take 1 tablet (4 mg total) by mouth every 6 (six) hours as needed for nausea.   Prenatal Vit-Fe Fumarate-FA (PRENATAL MULTIVITAMIN) TABS tablet Take 1 tablet by mouth daily at 12 noon.   promethazine  (PHENERGAN ) 25 MG tablet Take 1 tablet (25 mg total) by mouth every 6 (six) hours as needed for nausea or vomiting.   No facility-administered encounter medications on file as of 04/14/2024.    PAST MEDICAL HISTORY: Past Medical History:  Diagnosis Date   Acne    Anemia    Headache    Herpes    History of chlamydia infection     PAST SURGICAL HISTORY: Past Surgical History:  Procedure Laterality Date   DILATION AND EVACUATION N/A 12/20/2022   Procedure: DILATATION AND EVACUATION;  Surgeon: Abigail Abler, MD;  Location: [facility] OR;  Service: Gynecology;  Laterality: N/A;   HERNIA REPAIR     umbilical   WISDOM TOOTH EXTRACTION      ALLERGIES: Allergies  Allergen Reactions    Amoxicillin Hives   Penicillins Hives    Has patient had a PCN reaction causing immediate rash, facial/tongue/throat swelling, SOB or lightheadedness with hypotension: Yes Has patient had a PCN reaction causing severe rash involving mucus membranes or skin necrosis: No Has patient had a PCN reaction that required hospitalization Yes Has patient had a PCN reaction occurring within the last 10 years: No If all of the above answers are NO, then may proceed with Cephalosporin use.    FAMILY HISTORY: Family History  Problem Relation Age of Onset   Hypertension Mother    Hypertension Father    Asthma Sister    Learning disabilities Sister    Kidney disease Sister        dialysis   Cancer Maternal Grandmother    Diabetes Paternal Grandmother     SOCIAL HISTORY: Social History   Tobacco Use   Smoking status: Former    Types: Cigars    Quit date: 2020    Years since quitting: 5.4   Smokeless tobacco: Never   Tobacco comments:    not smoked since pregnant, passive smoker  Vaping Use   Vaping status: Former   Quit date: 12/11/2022  Substance Use Topics   Alcohol  use: Not Currently   Drug use: Not Currently    Types: Marijuana, Other-see comments    Comment: last smoked 10/2022, eats edibles   Social History   Social History Narrative  Not on file    Objective:  Vital Signs:  There were no vitals taken for this visit.  ***  Labs and Imaging review: Internal labs: CBC (12/19/23) significant for WBC 11.7, Hb 10.2, MCV 83.9  External labs: ***  Imaging/Procedures: ***  Assessment/Plan:  Peggy Mason is a 28 y.o. female who presents for evaluation of ***. *** has a relevant medical history of ***. *** neurological examination is pertinent for ***. Available diagnostic data is significant for ***. This constellation of symptoms and objective data would most likely localize to ***. ***  PLAN: -Blood work: *** ***  -Return to clinic ***  The impression above  as well as the plan as outlined below were extensively discussed with the patient (in the company of ***) who voiced understanding. All questions were answered to their satisfaction.  The patient was counseled on pertinent fall precautions per the printed material provided today, and as noted under the Patient Instructions section below.***  When available, results of the above investigations and possible further recommendations will be communicated to the patient via telephone/MyChart. Patient to call office if not contacted after expected testing turnaround time.   Total time spent reviewing records, interview, history/exam, documentation, and coordination of care on day of encounter:  *** min   Thank you for allowing me to participate in patient's care.  If I can answer any additional questions, I would be pleased to do so.  Rommie Coats, MD   CC: Pcp, No No address on file  CC: Referring provider: Vernal Gold, MD [address] [place],  [place] [address]

## 2024-04-14 ENCOUNTER — Ambulatory Visit: Admitting: Neurology

## 2024-05-05 NOTE — Progress Notes (Deleted)
 Initial neurology clinic note  Peggy Mason MRN: [id number] DOB: [birth year]  Referring provider: No ref. provider found  Primary care provider: Pcp, No  Reason for consult:  headaches  Subjective:  This is [name], a 28 y.o. ***-handed female with a medical history of vit D deficiency, anemia, genital herpes*** who presents to neurology clinic with ***. The patient is accompanied by ***.  *** Recurrent headaches - not much information  Pregnant? Not clear per notes - looks like she had a baby on 12/18/23***  Smoker: OCP use: Caffiene use: EtOH use: Restrictive diet: Family history:   MEDICATIONS:  Outpatient Encounter Medications as of 05/19/2024  Medication Sig   acetaminophen  (TYLENOL ) 325 MG tablet Take 2 tablets (650 mg total) by mouth every 4 (four) hours as needed (for pain scale < 4).   Blood Pressure Monitoring (BLOOD PRESSURE KIT) DEVI 1 Device by Does not apply route once a week.   ibuprofen  (ADVIL ) 600 MG tablet Take 1 tablet (600 mg total) by mouth every 6 (six) hours.   ondansetron  (ZOFRAN -ODT) 4 MG disintegrating tablet Take 1 tablet (4 mg total) by mouth every 6 (six) hours as needed for nausea.   Prenatal Vit-Fe Fumarate-FA (PRENATAL MULTIVITAMIN) TABS tablet Take 1 tablet by mouth daily at 12 noon.   promethazine  (PHENERGAN ) 25 MG tablet Take 1 tablet (25 mg total) by mouth every 6 (six) hours as needed for nausea or vomiting.   No facility-administered encounter medications on file as of 05/19/2024.    PAST MEDICAL HISTORY: Past Medical History:  Diagnosis Date   Acne    Anemia    Headache    Herpes    History of chlamydia infection     PAST SURGICAL HISTORY: Past Surgical History:  Procedure Laterality Date   DILATION AND EVACUATION N/A 12/20/2022   Procedure: DILATATION AND EVACUATION;  Surgeon: Zina Jerilynn LABOR, MD;  Location: [facility] OR;  Service: Gynecology;  Laterality: N/A;   HERNIA REPAIR     umbilical   WISDOM TOOTH EXTRACTION       ALLERGIES: Allergies  Allergen Reactions   Amoxicillin Hives   Penicillins Hives    Has patient had a PCN reaction causing immediate rash, facial/tongue/throat swelling, SOB or lightheadedness with hypotension: Yes Has patient had a PCN reaction causing severe rash involving mucus membranes or skin necrosis: No Has patient had a PCN reaction that required hospitalization Yes Has patient had a PCN reaction occurring within the last 10 years: No If all of the above answers are NO, then may proceed with Cephalosporin use.    FAMILY HISTORY: Family History  Problem Relation Age of Onset   Hypertension Mother    Hypertension Father    Asthma Sister    Learning disabilities Sister    Kidney disease Sister        dialysis   Cancer Maternal Grandmother    Diabetes Paternal Grandmother     SOCIAL HISTORY: Social History   Tobacco Use   Smoking status: Former    Types: Cigars    Quit date: 2020    Years since quitting: 5.5   Smokeless tobacco: Never   Tobacco comments:    not smoked since pregnant, passive smoker  Vaping Use   Vaping status: Former   Quit date: 12/11/2022  Substance Use Topics   Alcohol  use: Not Currently   Drug use: Not Currently    Types: Marijuana, Other-see comments    Comment: last smoked 10/2022, eats edibles  Social History   Social History Narrative   Not on file    Objective:  Vital Signs:  There were no vitals taken for this visit.  ***  Labs and Imaging review: Internal labs: CBC (12/19/23) significant for WBC 11.7, Hb 10.2, MCV 83.9 RPR (12/19/23): non-reactive  External labs: ***  Imaging/Procedures: ***  Assessment/Plan:  Peggy Mason is a 28 y.o. female who presents for evaluation of ***. *** has a relevant medical history of ***. *** neurological examination is pertinent for ***. Available diagnostic data is significant for ***. This constellation of symptoms and objective data would most likely localize to ***.  ***  PLAN: -Blood work: *** ***  -Return to clinic ***  The impression above as well as the plan as outlined below were extensively discussed with the patient (in the company of ***) who voiced understanding. All questions were answered to their satisfaction.  The patient was counseled on pertinent fall precautions per the printed material provided today, and as noted under the Patient Instructions section below.***  When available, results of the above investigations and possible further recommendations will be communicated to the patient via telephone/MyChart. Patient to call office if not contacted after expected testing turnaround time.   Total time spent reviewing records, interview, history/exam, documentation, and coordination of care on day of encounter:  *** min   Thank you for allowing me to participate in patient's care.  If I can answer any additional questions, I would be pleased to do so.  Venetia Potters, MD   CC: Pcp, No No address on file  CC: Referring provider: No referring provider defined for this encounter.

## 2024-05-12 ENCOUNTER — Ambulatory Visit (HOSPITAL_COMMUNITY): Admit: 2024-05-12 | Admitting: Obstetrics & Gynecology

## 2024-05-12 SURGERY — SALPINGECTOMY, ROBOT-ASSISTED
Anesthesia: General | Laterality: Bilateral

## 2024-05-19 ENCOUNTER — Ambulatory Visit: Admitting: Neurology

## 2024-07-15 NOTE — Progress Notes (Signed)
 Initial neurology clinic note  Peggy Mason MRN: [id number] DOB: 02-Feb-1996  Referring provider: No ref. provider found  Primary care provider: Pcp, No  Reason for consult:  headaches  Subjective:  This is [name], a 28 y.o. right-handed female with a medical history of vit D deficiency and anemia who presents to neurology clinic with headaches. The patient is alone today.  Patient has been having headaches as long as she can remember. She describes them as occurring over the whole head (temple and forehead areas). It can be throbbing and builds gradually. The headache can last hours to multiple days. She denies neck pain. She endorses photophobia and phonophobia. She denies significant nausea. She thinks she has about 10 headaches per month that can range from 4/10 to 10/10 when severe. She can get floaters in general, but no clear aura or vision loss.  When she gets a headache she will try to hydrate but when severe will go lay down in dark room. She has taken tylenol  in the past but didn't help much. She has also tried ibuprofen  which helps with headache for a bit. She will take 4-5 doses of ibuprofen  per month. She has never been prescribed a headache medication.  She thinks not drinking coffee, not eating, excitement, or stress are triggers for headache.  Smoker: recreational marijuana but no cigarettes OCP use: no; not currently breastfeeding (has a 58 month old); not planning on more children (has 5 children) Caffiene use: 1-2 cups per day, soda once per week, tea once per day EtOH use: 3 times per week, 1 drink Restrictive diet: no Family history of neurologic disease including headaches:mother, father, siblings all with headaches but no one has ever been treated  MEDICATIONS:  Outpatient Encounter Medications as of 07/29/2024  Medication Sig   Blood Pressure Monitoring (BLOOD PRESSURE KIT) DEVI 1 Device by Does not apply route once a week.   ibuprofen  (ADVIL )  600 MG tablet Take 1 tablet (600 mg total) by mouth every 6 (six) hours. (Patient taking differently: Take 600 mg by mouth as needed for headache.)   Prenatal Vit-Fe Fumarate-FA (PRENATAL MULTIVITAMIN) TABS tablet Take 1 tablet by mouth daily at 12 noon.   acetaminophen  (TYLENOL ) 325 MG tablet Take 2 tablets (650 mg total) by mouth every 4 (four) hours as needed (for pain scale < 4). (Patient not taking: Reported on 07/29/2024)   ondansetron  (ZOFRAN -ODT) 4 MG disintegrating tablet Take 1 tablet (4 mg total) by mouth every 6 (six) hours as needed for nausea. (Patient not taking: Reported on 07/29/2024)   promethazine  (PHENERGAN ) 25 MG tablet Take 1 tablet (25 mg total) by mouth every 6 (six) hours as needed for nausea or vomiting. (Patient not taking: Reported on 07/29/2024)   No facility-administered encounter medications on file as of 07/29/2024.    PAST MEDICAL HISTORY: Past Medical History:  Diagnosis Date   Acne    Anemia    Headache    Herpes    History of chlamydia infection     PAST SURGICAL HISTORY: Past Surgical History:  Procedure Laterality Date   DILATION AND EVACUATION N/A 12/20/2022   Procedure: DILATATION AND EVACUATION;  Surgeon: Zina Jerilynn LABOR, MD;  Location: [facility] OR;  Service: Gynecology;  Laterality: N/A;   HERNIA REPAIR     umbilical   WISDOM TOOTH EXTRACTION      ALLERGIES: Allergies  Allergen Reactions   Amoxicillin Hives   Penicillins Hives    Has patient had a PCN  reaction causing immediate rash, facial/tongue/throat swelling, SOB or lightheadedness with hypotension: Yes Has patient had a PCN reaction causing severe rash involving mucus membranes or skin necrosis: No Has patient had a PCN reaction that required hospitalization Yes Has patient had a PCN reaction occurring within the last 10 years: No If all of the above answers are NO, then may proceed with Cephalosporin use.    FAMILY HISTORY: Family History  Problem Relation Age of Onset    Hypertension Mother    Hypertension Father    Asthma Sister    Learning disabilities Sister    Kidney disease Sister        dialysis   Cancer Maternal Grandmother    Diabetes Paternal Grandmother     SOCIAL HISTORY: Social History   Tobacco Use   Smoking status: Former    Types: Cigars    Quit date: 2020    Years since quitting: 5.7   Smokeless tobacco: Never   Tobacco comments:    not smoked since pregnant, passive smoker  Vaping Use   Vaping status: Former   Quit date: 12/11/2022  Substance Use Topics   Alcohol  use: Yes    Comment: soc.   Drug use: Not Currently    Types: Marijuana, Other-see comments    Comment: last smoked 10/2022, eats edibles   Social History   Social History Narrative   Are you right handed or left handed? Right   Are you currently employed ? no   What is your current occupation?    Do you live at home alone?    Who lives with you? family   What type of home do you live in: 1 story or 2 story? one    Caffiene 1-2 coffee at day and 1-2 tea    Objective:  Vital Signs:  BP 104/71   Pulse 65   Ht 5' 4 (1.626 m)   Wt 183 lb (83 kg)   SpO2 99%   BMI 31.41 kg/m   General: No acute distress.  Patient appears well-groomed.   Head:  Normocephalic/atraumatic Eyes:  fundi examined, disc margins clear, no obvious papilledema Neck: supple, no paraspinal tenderness, full range of motion Back: No paraspinal tenderness Heart: regular rate and rhythm Lungs: Clear to auscultation bilaterally. Vascular: No carotid bruits.  Neurological Exam: Mental status: alert and oriented, speech fluent and not dysarthric, language intact.  Cranial nerves: CN I: not tested CN II: pupils equal, round and reactive to light, visual fields intact CN III, IV, VI:  full range of motion, no nystagmus, no ptosis CN V: facial sensation intact. CN VII: upper and lower face symmetric CN VIII: hearing intact CN IX, X: uvula midline CN XI: sternocleidomastoid and  trapezius muscles intact CN XII: tongue midline  Bulk & Tone: normal. Motor:  muscle strength 5/5 throughout Deep Tendon Reflexes:  2+ throughout.   Sensation:  Light touch sensation intact. Finger to nose testing:  Without dysmetria.     Gait:  Normal station and stride.  Romberg negative.  Labs and Imaging review: Internal labs: CBC (12/19/23) significant for WBC 11.7, Hb 10.2, MCV 83.9 RPR (12/19/23): non-reactive  Assessment/Plan:  Naliyah K Tomkiewicz is a 28 y.o. female who presents for evaluation of headaches. She has a relevant medical history of vit D deficiency and anemia. Her neurological examination is normal today. 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 She is currently having about 10 headaches per month. She has never  been on prescription medication for headaches previously. We discussed options for prevention including nortriptyline and topamax and common side effects. Patient would like to start with topamax.  PLAN: -Blood work: B12, TSH -For migraines: Migraine prevention:  Start Topamax 25 mg at bedtime Migraine rescue:  Start sumatriptan 100 mg as needed at headache onset, can repeat in 2 hours if needed Limit use of pain relievers to no more than 2 days out of week to prevent risk of rebound or medication-overuse headache. Keep headache diary  -Return to clinic in 3 months  The impression above as well as the plan as outlined below were extensively discussed with the patient who voiced understanding. All questions were answered to their satisfaction.  When available, results of the above investigations and possible further recommendations will be communicated to the patient via telephone/MyChart. Patient to call office if not contacted after expected testing turnaround time.   Total time spent reviewing records, interview, history/exam, documentation, and coordination of care on day of encounter:   50 min   Thank you for allowing me to participate in patient's care.  If I can answer any additional questions, I would be pleased to do so.  Venetia Potters, MD   CC: Pcp, No No address on file  CC: Referring provider: No referring provider defined for this encounter.

## 2024-07-29 ENCOUNTER — Ambulatory Visit: Admitting: Neurology

## 2024-07-29 ENCOUNTER — Other Ambulatory Visit

## 2024-07-29 ENCOUNTER — Encounter: Payer: Self-pay | Admitting: Neurology

## 2024-07-29 VITALS — BP 104/71 | HR 65 | Ht 64.0 in | Wt 183.0 lb

## 2024-07-29 DIAGNOSIS — G43009 Migraine without aura, not intractable, without status migrainosus: Secondary | ICD-10-CM | POA: Diagnosis not present

## 2024-07-29 LAB — TSH: TSH: 0.8 m[IU]/L

## 2024-07-29 LAB — VITAMIN B12: Vitamin B-12: 557 pg/mL (ref 200–1100)

## 2024-07-29 MED ORDER — SUMATRIPTAN SUCCINATE 100 MG PO TABS: 100.0000 mg | ORAL_TABLET | Freq: Once | ORAL | 5 refills | Status: AC | PRN

## 2024-07-29 MED ORDER — TOPIRAMATE 25 MG PO TABS: 25.0000 mg | ORAL_TABLET | Freq: Every day | ORAL | 5 refills | Status: AC

## 2024-07-29 NOTE — Patient Instructions (Addendum)
 I saw you today for headaches. I think the most likely cause of your headaches is migraines.  I would like to get blood work today. I will be in touch when I have those results.  To treat your headaches: Migraine prevention:  Start Topamax 25 mg at bedtime Migraine rescue:  Start sumatriptan 100 mg as needed at headache onset, can repeat in 2 hours if needed Limit use of pain relievers to no more than 2 days out of week to prevent risk of rebound or medication-overuse headache. Keep headache diary  I will see you again in about 3 months to check your progress. Please let me know if you have any questions or concerns in the meantime.  The physicians and staff at [facility] are committed to providing excellent care. You may receive a survey requesting feedback about your experience at our office. We strive to receive very good responses to the survey questions. If you feel that your experience would prevent you from giving the office a very good  response, please contact our office to try to remedy the situation. We may be reached at [phone]. Thank you for taking the time out of your busy day to complete the survey.  Venetia Potters, MD [facility]  More migraine information: Be aware of common food triggers:  - Caffeine :  coffee, black tea, cola, Mt. Dew  - Chocolate  - Dairy:  aged cheeses (brie, blue, cheddar, gouda, Parmasan, provolone, romano, Swiss, etc), chocolate milk, buttermilk, sour cream, limit eggs and yogurt  - Nuts, peanut butter  - Alcohol   - Cereals/grains:  FRESH breads (fresh bagels, sourdough, doughnuts), yeast productions  - Processed/canned/aged/cured meats (pre-packaged deli meats, hotdogs)  - MSG/glutamate:  soy sauce, flavor enhancer, pickled/preserved/marinated foods  - Sweeteners:  aspartame (Equal, Nutrasweet).  Sugar and Splenda are okay  - Vegetables:  legumes (lima beans, lentils, snow peas, fava beans, pinto peans, peas, garbanzo beans),  sauerkraut, onions, olives, pickles  - Fruit:  avocados, bananas, citrus fruit (orange, lemon, grapefruit), mango  - Other:  Frozen meals, macaroni and cheese Routine exercise Stay adequately hydrated (aim for 64 oz water daily) Keep headache diary Maintain proper stress management Maintain proper sleep hygiene Do not skip meals Consider supplements:  magnesium citrate 400mg  daily, riboflavin 400mg  daily, coenzyme Q10 100mg  three times daily.

## 2024-07-30 ENCOUNTER — Ambulatory Visit: Payer: Self-pay | Admitting: Neurology

## 2024-09-15 ENCOUNTER — Encounter (HOSPITAL_COMMUNITY): Payer: Self-pay | Admitting: *Deleted

## 2024-09-15 ENCOUNTER — Inpatient Hospital Stay (HOSPITAL_COMMUNITY)
Admission: AD | Admit: 2024-09-15 | Discharge: 2024-09-15 | Disposition: A | Discharge status: Home / Self Care | Attending: Obstetrics and Gynecology | Admitting: Obstetrics and Gynecology

## 2024-09-15 DIAGNOSIS — O98311 Other infections with a predominantly sexual mode of transmission complicating pregnancy, first trimester: Secondary | ICD-10-CM | POA: Insufficient documentation

## 2024-09-15 DIAGNOSIS — O219 Vomiting of pregnancy, unspecified: Secondary | ICD-10-CM | POA: Insufficient documentation

## 2024-09-15 DIAGNOSIS — Z3A01 Less than 8 weeks gestation of pregnancy: Secondary | ICD-10-CM | POA: Insufficient documentation

## 2024-09-15 DIAGNOSIS — A599 Trichomoniasis, unspecified: Secondary | ICD-10-CM | POA: Insufficient documentation

## 2024-09-15 LAB — COMPREHENSIVE METABOLIC PANEL WITH GFR
ALT: 21 U/L (ref 0–44)
AST: 17 U/L (ref 15–41)
Albumin: 3.6 g/dL (ref 3.5–5.0)
Alkaline Phosphatase: 90 U/L (ref 38–126)
Anion gap: 7 (ref 5–15)
BUN: 6 mg/dL (ref 6–20)
CO2: 25 mmol/L (ref 22–32)
Calcium: 9.1 mg/dL (ref 8.9–10.3)
Chloride: 103 mmol/L (ref 98–111)
Creatinine, Ser: 0.64 mg/dL (ref 0.44–1.00)
GFR, Estimated: >60 mL/min (ref >60)
Glucose, Bld: 84 mg/dL (ref 70–99)
Potassium: 3.8 mmol/L (ref 3.5–5.1)
Sodium: 135 mmol/L (ref 135–145)
Total Bilirubin: 0.9 mg/dL (ref 0.0–1.2)
Total Protein: 7.5 g/dL (ref 6.5–8.1)

## 2024-09-15 LAB — URINALYSIS, ROUTINE W REFLEX MICROSCOPIC
Bilirubin Urine: NEGATIVE
Glucose, UA: NEGATIVE mg/dL
Ketones, ur: 20 mg/dL — AB
Nitrite: POSITIVE — AB
Protein, ur: 100 mg/dL — AB
Specific Gravity, Urine: 1.025 (ref 1.005–1.030)
pH: 5 (ref 5.0–8.0)

## 2024-09-15 LAB — WET PREP, GENITAL
Clue Cells Wet Prep HPF POC: NONE SEEN
Sperm: NONE SEEN
WBC, Wet Prep HPF POC: >=10 — AB (ref <10)
Yeast Wet Prep HPF POC: NONE SEEN

## 2024-09-15 LAB — CBC WITH DIFFERENTIAL/PLATELET
Abs Immature Granulocytes: 0.02 K/uL (ref 0.00–0.07)
Basophils Absolute: 0.1 K/uL (ref 0.0–0.1)
Basophils Relative: 1 %
Eosinophils Absolute: 0.1 K/uL (ref 0.0–0.5)
Eosinophils Relative: 1 %
HCT: 39.3 % (ref 36.0–46.0)
Hemoglobin: 13.1 g/dL (ref 12.0–15.0)
Immature Granulocytes: 0 %
Lymphocytes Relative: 22 %
Lymphs Abs: 2 K/uL (ref 0.7–4.0)
MCH: 27.5 pg (ref 26.0–34.0)
MCHC: 33.3 g/dL (ref 30.0–36.0)
MCV: 82.4 fL (ref 80.0–100.0)
Monocytes Absolute: 1.2 K/uL — ABNORMAL HIGH (ref 0.1–1.0)
Monocytes Relative: 14 %
Neutro Abs: 5.7 K/uL (ref 1.7–7.7)
Neutrophils Relative %: 62 %
Platelets: 393 K/uL (ref 150–400)
RBC: 4.77 MIL/uL (ref 3.87–5.11)
RDW: 14.7 % (ref 11.5–15.5)
WBC: 9.1 K/uL (ref 4.0–10.5)
nRBC: 0 % (ref 0.0–0.2)

## 2024-09-15 LAB — POCT PREGNANCY, URINE: Preg Test, Ur: POSITIVE — AB

## 2024-09-15 MED ORDER — SCOPOLAMINE 1 MG/3DAYS TD PT72: 1.0000 | MEDICATED_PATCH | TRANSDERMAL | 0 refills | Status: AC

## 2024-09-15 MED ORDER — LACTATED RINGERS IV BOLUS
1000.0000 mL | Freq: Once | INTRAVENOUS | Status: AC
  Administered 2024-09-15: 1000 mL via INTRAVENOUS

## 2024-09-15 MED ORDER — METRONIDAZOLE 500 MG PO TABS: 500.0000 mg | ORAL_TABLET | Freq: Two times a day (BID) | ORAL | 0 refills | Status: AC

## 2024-09-15 MED ORDER — PROMETHAZINE HCL 25 MG PO TABS: 25.0000 mg | ORAL_TABLET | Freq: Four times a day (QID) | ORAL | 2 refills | Status: AC | PRN

## 2024-09-15 MED ORDER — SODIUM CHLORIDE 0.9 % IV SOLN
25.0000 mg | Freq: Once | INTRAVENOUS | Status: AC
  Administered 2024-09-15: 25 mg via INTRAVENOUS
  Filled 2024-09-15: qty 1

## 2024-09-15 MED ORDER — SCOPOLAMINE 1 MG/3DAYS TD PT72
1.0000 | MEDICATED_PATCH | TRANSDERMAL | Status: DC
  Administered 2024-09-15: 1 mg via TRANSDERMAL
  Filled 2024-09-15: qty 1

## 2024-09-15 NOTE — MAU Note (Signed)
 Peggy Mason is a 28 y.o. at Unknown here in MAU reporting: + HPT 2 weeks ago. Went to [facility] yesterday and found out it is triples. In the past 2 days she has vomited many times. Can't keep anything down. Having some vag discharge.   LMP: 10/19/225 Onset of complaint: 2 days Pain score: 2 Vitals:   09/15/24 1607  BP: 115/64  Pulse: 79  Resp: 18  Temp: 98.9 F (37.2 C)     FHT: n/a  Lab orders placed from triage: u/a, vag swabs 

## 2024-09-15 NOTE — MAU Provider Note (Cosign Needed Addendum)
 Chief Complaint:  Nausea   HPI   None     Peggy Mason is a 28 y.o. [id number] at [redacted]w[redacted]d who presents to maternity admissions reporting nausea with multiple episodes of vomiting today. She reports not able to keep anything down. She reports she has had nausea and vomiting with all her pregnancies, has not tried anything this pregnancy. She reports phenergan  has worked well in the past.   Pregnancy Course: Has not yet initiated care, has gone to [facility] in past.   Past Medical History:  Diagnosis Date   Acne    Anemia    Headache    Herpes    History of chlamydia infection    OB History  Gravida Para Term Preterm AB Living  7 5 5  0 1 5  SAB IAB Ectopic Multiple Live Births  1 0 0 0 5    # Outcome Date GA Lbr Len/2nd Weight Sex Type Anes PTL Lv  7 Current           6 Term 12/18/23 [redacted]w[redacted]d 01:08 2915 g M Vag-Spont None  LIV  5 SAB 12/18/22 [redacted]w[redacted]d         4 Term 04/19/22 [redacted]w[redacted]d 01:28 / 02:34 3290 g F Vag-Spont EPI  LIV     Birth Comments: WNL  3 Term 03/12/19 [redacted]w[redacted]d 19:10 / 00:47 3960 g M Vag-Spont None  LIV  2 Term 03/30/16 [redacted]w[redacted]d 10:30 / 00:03 3005 g M Vag-Spont EPI  LIV  1 Term 09/02/12 [redacted]w[redacted]d 14:03 / 01:34 3450 g F Vag-Spont EPI  LIV   Past Surgical History:  Procedure Laterality Date   DILATION AND EVACUATION N/A 12/20/2022   Procedure: DILATATION AND EVACUATION;  Surgeon: Zina Jerilynn LABOR, MD;  Location: [facility] OR;  Service: Gynecology;  Laterality: N/A;   HERNIA REPAIR     umbilical   WISDOM TOOTH EXTRACTION     Family History  Problem Relation Age of Onset   Hypertension Mother    Hypertension Father    Asthma Sister    Learning disabilities Sister    Kidney disease Sister        dialysis   Cancer Maternal Grandmother    Diabetes Paternal Grandmother    Social History   Tobacco Use   Smoking status: Former    Types: Cigars    Quit date: 2020    Years since quitting: 5.9   Smokeless tobacco: Never   Tobacco comments:    not smoked since pregnant, passive smoker   Vaping Use   Vaping status: Former   Quit date: 12/11/2022  Substance Use Topics   Alcohol  use: Yes    Comment: soc.   Drug use: Not Currently    Types: Marijuana, Other-see comments    Comment: last smoked 10/2022, eats edibles   Allergies  Allergen Reactions   Amoxicillin Hives   Penicillins Hives    Has patient had a PCN reaction causing immediate rash, facial/tongue/throat swelling, SOB or lightheadedness with hypotension: Yes Has patient had a PCN reaction causing severe rash involving mucus membranes or skin necrosis: No Has patient had a PCN reaction that required hospitalization Yes Has patient had a PCN reaction occurring within the last 10 years: No If all of the above answers are NO, then may proceed with Cephalosporin use.   No medications prior to admission.    I have reviewed patient's Past Medical Hx, Surgical Hx, Family Hx, Social Hx, medications and allergies.   ROS  Pertinent items noted in HPI and  remainder of comprehensive ROS otherwise negative.   PHYSICAL EXAM  Patient Vitals for the past 24 hrs:  BP Temp Pulse Resp Height Weight  09/15/24 1607 115/64 98.9 F (37.2 C) 79 18 5' 3 (1.6 m) 75.8 kg    Physical Exam Vitals and nursing note reviewed.  Constitutional:      General: She is not in acute distress.    Appearance: Normal appearance. She is not toxic-appearing or diaphoretic.  HENT:     Head: Normocephalic.  Cardiovascular:     Rate and Rhythm: Normal rate.     Pulses: Normal pulses.  Pulmonary:     Effort: Pulmonary effort is normal.  Skin:    General: Skin is warm and dry.     Capillary Refill: Capillary refill takes less than 2 seconds.  Neurological:     General: No focal deficit present.     Mental Status: She is alert and oriented to person, place, and time.  Psychiatric:        Mood and Affect: Mood normal.        Behavior: Behavior normal.        Thought Content: Thought content normal.        Judgment: Judgment normal.       Labs: Results for orders placed or performed during the hospital encounter of 09/15/24 (from the past 24 hours)  Pregnancy, urine POC     Status: Abnormal   Collection Time: 09/15/24  3:56 PM  Result Value Ref Range   Preg Test, Ur POSITIVE (A) NEGATIVE  Wet prep, genital     Status: Abnormal   Collection Time: 09/15/24  4:00 PM  Result Value Ref Range   Yeast Wet Prep HPF POC NONE SEEN NONE SEEN   Trich, Wet Prep PRESENT (A) NONE SEEN   Clue Cells Wet Prep HPF POC NONE SEEN NONE SEEN   WBC, Wet Prep HPF POC >=10 (A) <10   Sperm NONE SEEN   Urinalysis, Routine w reflex microscopic -Urine, Clean Catch     Status: Abnormal   Collection Time: 09/15/24  4:00 PM  Result Value Ref Range   Color, Urine AMBER (A) YELLOW   APPearance CLOUDY (A) CLEAR   Specific Gravity, Urine 1.025 1.005 - 1.030   pH 5.0 5.0 - 8.0   Glucose, UA NEGATIVE NEGATIVE mg/dL   Hgb urine dipstick MODERATE (A) NEGATIVE   Bilirubin Urine NEGATIVE NEGATIVE   Ketones, ur 20 (A) NEGATIVE mg/dL   Protein, ur 899 (A) NEGATIVE mg/dL   Nitrite POSITIVE (A) NEGATIVE   Leukocytes,Ua MODERATE (A) NEGATIVE   RBC / HPF 6-10 0 - 5 RBC/hpf   WBC, UA 11-20 0 - 5 WBC/hpf   Bacteria, UA FEW (A) NONE SEEN   Squamous Epithelial / HPF 0-5 0 - 5 /HPF   Mucus PRESENT    Amorphous Crystal PRESENT   CBC with Differential/Platelet     Status: Abnormal   Collection Time: 09/15/24  5:09 PM  Result Value Ref Range   WBC 9.1 4.0 - 10.5 K/uL   RBC 4.77 3.87 - 5.11 MIL/uL   Hemoglobin 13.1 12.0 - 15.0 g/dL   HCT 60.6 63.9 - 53.9 %   MCV 82.4 80.0 - 100.0 fL   MCH 27.5 26.0 - 34.0 pg   MCHC 33.3 30.0 - 36.0 g/dL   RDW 85.2 88.4 - 84.4 %   Platelets 393 150 - 400 K/uL   nRBC 0.0 0.0 - 0.2 %   Neutrophils Relative %  62 %   Neutro Abs 5.7 1.7 - 7.7 K/uL   Lymphocytes Relative 22 %   Lymphs Abs 2.0 0.7 - 4.0 K/uL   Monocytes Relative 14 %   Monocytes Absolute 1.2 (H) 0.1 - 1.0 K/uL   Eosinophils Relative 1 %   Eosinophils  Absolute 0.1 0.0 - 0.5 K/uL   Basophils Relative 1 %   Basophils Absolute 0.1 0.0 - 0.1 K/uL   Immature Granulocytes 0 %   Abs Immature Granulocytes 0.02 0.00 - 0.07 K/uL  Comprehensive metabolic panel     Status: None   Collection Time: 09/15/24  5:09 PM  Result Value Ref Range   Sodium 135 135 - 145 mmol/L   Potassium 3.8 3.5 - 5.1 mmol/L   Chloride 103 98 - 111 mmol/L   CO2 25 22 - 32 mmol/L   Glucose, Bld 84 70 - 99 mg/dL   BUN 6 6 - 20 mg/dL   Creatinine, Ser 9.35 0.44 - 1.00 mg/dL   Calcium 9.1 8.9 - 89.6 mg/dL   Total Protein 7.5 6.5 - 8.1 g/dL   Albumin 3.6 3.5 - 5.0 g/dL   AST 17 15 - 41 U/L   ALT 21 0 - 44 U/L   Alkaline Phosphatase 90 38 - 126 U/L   Total Bilirubin 0.9 0.0 - 1.2 mg/dL   GFR, Estimated >39 >39 mL/min   Anion gap 7 5 - 15    Imaging:  No results found.  MDM & MAU COURSE  MDM:  Moderate  MAU Course:  - IV fluid bolus and 25mg  phenergan  administered with good relief, addition of scopolamine  for better home coverage.  - Informed patient of positive trichomoniasis result and recommendation for treatment and treatment of any sexual partners.  - Will send urine for culture, though absent symptoms so do not plan to treat at this time.   Orders Placed This Encounter  Procedures   Wet prep, genital   Culture, OB Urine   Urinalysis, Routine w reflex microscopic -Urine, Clean Catch   CBC with Differential/Platelet   Comprehensive metabolic panel   Pregnancy, urine POC   Insert peripheral IV   Discharge patient   Meds ordered this encounter  Medications   lactated ringers  bolus 1,000 mL   promethazine  (PHENERGAN ) 25 mg in sodium chloride  0.9 % 50 mL IVPB   lactated ringers  bolus 1,000 mL   scopolamine  (TRANSDERM-SCOP) 1 MG/3DAYS 1 mg   scopolamine  (TRANSDERM-SCOP) 1 MG/3DAYS    Sig: Place 1 patch (1 mg total) onto the skin every 3 (three) days.    Dispense:  10 patch    Refill:  0    Supervising Provider:   PICKENS, CHARLIE [[id number]]    promethazine  (PHENERGAN ) 25 MG tablet    Sig: Take 1 tablet (25 mg total) by mouth every 6 (six) hours as needed for nausea or vomiting.    Dispense:  30 tablet    Refill:  2    Supervising Provider:   PICKENS, CHARLIE [[id number]]   metroNIDAZOLE  (FLAGYL ) 500 MG tablet    Sig: Take 1 tablet (500 mg total) by mouth 2 (two) times daily.    Dispense:  14 tablet    Refill:  0    Supervising Provider:   IZELL HARARI [[id number]]    ASSESSMENT   1. Trichomoniasis   2. [redacted] weeks gestation of pregnancy   3. Nausea and vomiting of pregnancy, antepartum     PLAN  Discharge home in stable condition. Return to MAU  PRN.  - Phenergan  25mg  q6h PRN for nausea. Scopolamine  1mg  transdermal patch applied q72hours for nausea. Both sent to preferred pharmacy. - Safe medications in pregnancy list provided.  - Metronidazole  500mg  BID PO for 7 days for treatment for trichomoniasis sent to preferred pharmacy.  - Physical prescription provided for sexual partner for expedited partner treatment of trichomoniasis.  - Patient to initiate care at preferred provider.  - Urine culture pending, though absent symptoms so do not plan to treat at this time.    Allergies as of 09/15/2024       Reactions   Amoxicillin Hives   Penicillins Hives   Has patient had a PCN reaction causing immediate rash, facial/tongue/throat swelling, SOB or lightheadedness with hypotension: Yes Has patient had a PCN reaction causing severe rash involving mucus membranes or skin necrosis: No Has patient had a PCN reaction that required hospitalization Yes Has patient had a PCN reaction occurring within the last 10 years: No If all of the above answers are NO, then may proceed with Cephalosporin use.        Medication List     STOP taking these medications    ibuprofen  600 MG tablet Commonly known as: ADVIL        TAKE these medications    acetaminophen  325 MG tablet Commonly known as: Tylenol  Take 2 tablets (650 mg total)  by mouth every 4 (four) hours as needed (for pain scale < 4).   Blood Pressure Kit Devi 1 Device by Does not apply route once a week.   metroNIDAZOLE  500 MG tablet Commonly known as: FLAGYL  Take 1 tablet (500 mg total) by mouth 2 (two) times daily.   ondansetron  4 MG disintegrating tablet Commonly known as: ZOFRAN -ODT Take 1 tablet (4 mg total) by mouth every 6 (six) hours as needed for nausea.   prenatal multivitamin Tabs tablet Take 1 tablet by mouth daily at 12 noon.   promethazine  25 MG tablet Commonly known as: PHENERGAN  Take 1 tablet (25 mg total) by mouth every 6 (six) hours as needed for nausea or vomiting. What changed: Another medication with the same name was added. Make sure you understand how and when to take each.   promethazine  25 MG tablet Commonly known as: PHENERGAN  Take 1 tablet (25 mg total) by mouth every 6 (six) hours as needed for nausea or vomiting. What changed: You were already taking a medication with the same name, and this prescription was added. Make sure you understand how and when to take each.   scopolamine  1 MG/3DAYS Commonly known as: TRANSDERM-SCOP Place 1 patch (1 mg total) onto the skin every 3 (three) days.   SUMAtriptan  100 MG tablet Commonly known as: IMITREX  Take 1 tablet (100 mg total) by mouth once as needed for up to 1 dose. May repeat in 2 hours if headache persists or recurs.   topiramate  25 MG tablet Commonly known as: TOPAMAX  Take 1 tablet (25 mg total) by mouth daily.        Camie Rote, MSN, CNM 09/15/2024 9:29 PM  Certified Nurse Midwife, [facility]

## 2024-09-15 NOTE — Discharge Instructions (Signed)

## 2024-09-16 LAB — GC/CHLAMYDIA PROBE AMP (~~LOC~~) NOT AT ARMC
Chlamydia: NEGATIVE
Comment: NEGATIVE
Comment: NORMAL
Neisseria Gonorrhea: NEGATIVE

## 2024-09-17 ENCOUNTER — Ambulatory Visit: Payer: Self-pay | Admitting: Certified Nurse Midwife

## 2024-09-17 DIAGNOSIS — N390 Urinary tract infection, site not specified: Secondary | ICD-10-CM

## 2024-09-17 LAB — CULTURE, OB URINE: Culture: >=100000 — AB

## 2024-09-17 MED ORDER — NITROFURANTOIN MONOHYD MACRO 100 MG PO CAPS: 100.0000 mg | ORAL_CAPSULE | Freq: Two times a day (BID) | ORAL | 0 refills | Status: AC

## 2024-09-28 ENCOUNTER — Encounter (HOSPITAL_COMMUNITY): Payer: Self-pay | Admitting: Obstetrics and Gynecology

## 2024-09-28 ENCOUNTER — Inpatient Hospital Stay (HOSPITAL_COMMUNITY)

## 2024-09-28 ENCOUNTER — Inpatient Hospital Stay (HOSPITAL_COMMUNITY)
Admission: AD | Admit: 2024-09-28 | Discharge: 2024-09-29 | Disposition: A | Payer: Self-pay | Source: Home / Self Care | Attending: Obstetrics and Gynecology | Admitting: Obstetrics and Gynecology

## 2024-09-28 DIAGNOSIS — Z3A08 8 weeks gestation of pregnancy: Secondary | ICD-10-CM

## 2024-09-28 DIAGNOSIS — E860 Dehydration: Secondary | ICD-10-CM | POA: Diagnosis not present

## 2024-09-28 DIAGNOSIS — O98311 Other infections with a predominantly sexual mode of transmission complicating pregnancy, first trimester: Secondary | ICD-10-CM | POA: Diagnosis not present

## 2024-09-28 DIAGNOSIS — Z3491 Encounter for supervision of normal pregnancy, unspecified, first trimester: Secondary | ICD-10-CM

## 2024-09-28 DIAGNOSIS — O26891 Other specified pregnancy related conditions, first trimester: Secondary | ICD-10-CM | POA: Insufficient documentation

## 2024-09-28 DIAGNOSIS — O208 Other hemorrhage in early pregnancy: Secondary | ICD-10-CM | POA: Diagnosis not present

## 2024-09-28 DIAGNOSIS — A5900 Urogenital trichomoniasis, unspecified: Secondary | ICD-10-CM | POA: Diagnosis not present

## 2024-09-28 DIAGNOSIS — R109 Unspecified abdominal pain: Secondary | ICD-10-CM | POA: Insufficient documentation

## 2024-09-28 DIAGNOSIS — N390 Urinary tract infection, site not specified: Secondary | ICD-10-CM | POA: Insufficient documentation

## 2024-09-28 DIAGNOSIS — O99281 Endocrine, nutritional and metabolic diseases complicating pregnancy, first trimester: Secondary | ICD-10-CM | POA: Diagnosis not present

## 2024-09-28 DIAGNOSIS — O219 Vomiting of pregnancy, unspecified: Secondary | ICD-10-CM

## 2024-09-28 DIAGNOSIS — O2341 Unspecified infection of urinary tract in pregnancy, first trimester: Secondary | ICD-10-CM | POA: Insufficient documentation

## 2024-09-28 LAB — URINALYSIS, ROUTINE W REFLEX MICROSCOPIC
Glucose, UA: NEGATIVE mg/dL
Ketones, ur: 15 mg/dL — AB
Nitrite: NEGATIVE
Protein, ur: 30 mg/dL — AB
Specific Gravity, Urine: >1.03 — ABNORMAL HIGH (ref 1.005–1.030)
pH: 6 (ref 5.0–8.0)

## 2024-09-28 LAB — COMPREHENSIVE METABOLIC PANEL WITH GFR
ALT: 81 U/L — ABNORMAL HIGH (ref 0–44)
AST: 53 U/L — ABNORMAL HIGH (ref 15–41)
Albumin: 4.4 g/dL (ref 3.5–5.0)
Alkaline Phosphatase: 103 U/L (ref 38–126)
Anion gap: 13 (ref 5–15)
BUN: 6 mg/dL (ref 6–20)
CO2: 23 mmol/L (ref 22–32)
Calcium: 10 mg/dL (ref 8.9–10.3)
Chloride: 98 mmol/L (ref 98–111)
Creatinine, Ser: 0.54 mg/dL (ref 0.44–1.00)
GFR, Estimated: >60 mL/min (ref >60)
Glucose, Bld: 84 mg/dL (ref 70–99)
Potassium: 3.5 mmol/L (ref 3.5–5.1)
Sodium: 134 mmol/L — ABNORMAL LOW (ref 135–145)
Total Bilirubin: 0.5 mg/dL (ref 0.0–1.2)
Total Protein: 7.5 g/dL (ref 6.5–8.1)

## 2024-09-28 LAB — URINALYSIS, MICROSCOPIC (REFLEX): WBC, UA: >50 WBC/hpf (ref 0–5)

## 2024-09-28 MED ORDER — ONDANSETRON 4 MG PO TBDP
8.0000 mg | ORAL_TABLET | Freq: Once | ORAL | Status: AC
  Administered 2024-09-28: 21:00:00 8 mg via ORAL
  Filled 2024-09-28: qty 2

## 2024-09-28 MED ORDER — LACTATED RINGERS IV BOLUS
1000.0000 mL | Freq: Once | INTRAVENOUS | Status: AC
  Administered 2024-09-28: 22:00:00 1000 mL via INTRAVENOUS

## 2024-09-28 MED ORDER — ONDANSETRON 4 MG PO TBDP: 8.0000 mg | ORAL_TABLET | Freq: Four times a day (QID) | ORAL | 2 refills | Status: AC | PRN

## 2024-09-28 NOTE — Discharge Instructions (Addendum)
 You came into the MAU because your ultrasound completed elsewhere showed more than 1 fetus. The ultrasound we did here showed 1 single fetus. You were also dehydrated and nauseous so we gave you some fluids and zofran . I sent zofran  to your pharmacy. Please call [facility] to make an appointment for your prenatal care.  Please take your medications as prescribed. Flagyl  is to treat trichomonas. Macrobid  is used to treat the UTI. If you have not picked them up, I do recommend doing so.

## 2024-09-28 NOTE — MAU Note (Signed)
 Peggy Mason is a 28 y.o. at [redacted]w[redacted]d here in MAU reporting being here not too long ago. Going back and forth from [facility] and [facility]. They are telling her she has anywhere from 2-4 babies. Was told she was dehydrated and needs flds and confirmation of how many babies so the pt can decide what she is gonna do about the pregnancy. Unable to eat or drink. Has meds but stopped taking Promethazine  due to blurred vision. All was sleepy and was hard to take care of her kids. Some abd pain. Stopped taking Flagyl  due to n/v  LMP: 08/01/24 Onset of complaint: 1wk Pain score: 6 Vitals:   09/28/24 1940 09/28/24 1943  BP:  113/78  Pulse: 77   Resp: 17   Temp: 98.1 F (36.7 C)   SpO2: 99%      FHT: na  Lab orders placed from triage: u/a

## 2024-09-28 NOTE — MAU Provider Note (Signed)
 Chief Complaint:  Emesis During Pregnancy and Abdominal Pain   HPI   None     Peggy Mason is a 28 y.o. H2E4984 at [redacted]w[redacted]d who presents to maternity admissions reporting ***.   [facility] mentioned 3 babies on ultrasound 2 weeks ago with one sac having 1 baby and another sac having 2. [facility] last week but didn't have an ultrasound done there. Was also told that she was dehydrated and she needed to come to [facility] to get fluids. Yesterday, she went for labs and they did another ultrasound and said there could be more than 3.   She notes fatigue, thirst, feeling of foam in her mouth. Has been able to tolerate honeydew and cantaloupe, but very nauseous and is vomiting.   Pregnancy Course: ***  Past Medical History:  Diagnosis Date   Acne    Anemia    Headache    Herpes    History of chlamydia infection    OB History  Gravida Para Term Preterm AB Living  7 5 5  0 1 5  SAB IAB Ectopic Multiple Live Births  1 0 0 0 5    # Outcome Date GA Lbr Len/2nd Weight Sex Type Anes PTL Lv  7 Current           6 Term 12/18/23 [redacted]w[redacted]d 01:08 2915 g M Vag-Spont None  LIV  5 SAB 12/18/22 [redacted]w[redacted]d         4 Term 04/19/22 [redacted]w[redacted]d 01:28 / 02:34 3290 g F Vag-Spont EPI  LIV     Birth Comments: WNL  3 Term 03/12/19 [redacted]w[redacted]d 19:10 / 00:47 3960 g M Vag-Spont None  LIV  2 Term 03/30/16 [redacted]w[redacted]d 10:30 / 00:03 3005 g M Vag-Spont EPI  LIV  1 Term 09/02/12 [redacted]w[redacted]d 14:03 / 01:34 3450 g F Vag-Spont EPI  LIV   Past Surgical History:  Procedure Laterality Date   DILATION AND EVACUATION N/A 12/20/2022   Procedure: DILATATION AND EVACUATION;  Surgeon: Zina Jerilynn LABOR, MD;  Location: [facility] OR;  Service: Gynecology;  Laterality: N/A;   HERNIA REPAIR     umbilical   WISDOM TOOTH EXTRACTION     Family History  Problem Relation Age of Onset   Hypertension Mother    Hypertension Father    Asthma Sister    Learning disabilities Sister    Kidney disease Sister        dialysis   Cancer  Maternal Grandmother    Diabetes Paternal Grandmother    Social History[1] Allergies[2] Medications Prior to Admission  Medication Sig Dispense Refill Last Dose/Taking   acetaminophen  (TYLENOL ) 325 MG tablet Take 2 tablets (650 mg total) by mouth every 4 (four) hours as needed (for pain scale < 4). (Patient not taking: Reported on 07/29/2024)      Blood Pressure Monitoring (BLOOD PRESSURE KIT) DEVI 1 Device by Does not apply route once a week. 1 each 0    metroNIDAZOLE  (FLAGYL ) 500 MG tablet Take 1 tablet (500 mg total) by mouth 2 (two) times daily. 14 tablet 0    nitrofurantoin , macrocrystal-monohydrate, (MACROBID ) 100 MG capsule Take 1 capsule (100 mg total) by mouth 2 (two) times daily. 14 capsule 0    ondansetron  (ZOFRAN -ODT) 4 MG disintegrating tablet Take 1 tablet (4 mg total) by mouth every 6 (six) hours as needed for nausea. (Patient not taking: Reported on 07/29/2024) 20 tablet 0    Prenatal Vit-Fe Fumarate-FA (PRENATAL MULTIVITAMIN) TABS tablet Take 1  tablet by mouth daily at 12 noon.      promethazine  (PHENERGAN ) 25 MG tablet Take 1 tablet (25 mg total) by mouth every 6 (six) hours as needed for nausea or vomiting. (Patient not taking: Reported on 07/29/2024) 30 tablet 1    promethazine  (PHENERGAN ) 25 MG tablet Take 1 tablet (25 mg total) by mouth every 6 (six) hours as needed for nausea or vomiting. 30 tablet 2    scopolamine  (TRANSDERM-SCOP) 1 MG/3DAYS Place 1 patch (1 mg total) onto the skin every 3 (three) days. 10 patch 0    SUMAtriptan  (IMITREX ) 100 MG tablet Take 1 tablet (100 mg total) by mouth once as needed for up to 1 dose. May repeat in 2 hours if headache persists or recurs. 10 tablet 5    topiramate  (TOPAMAX ) 25 MG tablet Take 1 tablet (25 mg total) by mouth daily. 30 tablet 5     I have reviewed patient's Past Medical Hx, Surgical Hx, Family Hx, Social Hx, medications and allergies.   ROS  Pertinent items noted in HPI and remainder of comprehensive ROS otherwise  negative.   PHYSICAL EXAM  Patient Vitals for the past 24 hrs:  BP Temp Pulse Resp SpO2 Height Weight  09/28/24 1943 113/78 -- -- -- -- -- --  09/28/24 1940 -- 98.1 F (36.7 C) 77 17 99 % 5' 3 (1.6 m) 74.8 kg    Constitutional: Well-developed, well-nourished female in no acute distress.  Cardiovascular: normal rate & rhythm, warm and well-perfused Respiratory: normal effort, no problems with respiration noted GI: Abd soft, non-tender, non-distended MS: Extremities nontender, no edema, normal ROM Neurologic: Alert and oriented x 4.  GU: no CVA tenderness Pelvic: normal external female genitalia, physiologic discharge, no blood, cervix clean.      Fetal Tracing: Baseline: Variability: Accelerations:  Decelerations: Toco:    Labs: Results for orders placed or performed during the hospital encounter of 09/28/24 (from the past 24 hours)  Urinalysis, Routine w reflex microscopic -Urine, Clean Catch     Status: Abnormal   Collection Time: 09/28/24  8:00 PM  Result Value Ref Range   Color, Urine ORANGE (A) YELLOW   APPearance HAZY (A) CLEAR   Specific Gravity, Urine >1.030 (H) 1.005 - 1.030   pH 6.0 5.0 - 8.0   Glucose, UA NEGATIVE NEGATIVE mg/dL   Hgb urine dipstick LARGE (A) NEGATIVE   Bilirubin Urine SMALL (A) NEGATIVE   Ketones, ur 15 (A) NEGATIVE mg/dL   Protein, ur 30 (A) NEGATIVE mg/dL   Nitrite NEGATIVE NEGATIVE   Leukocytes,Ua MODERATE (A) NEGATIVE  Urinalysis, Microscopic (reflex)     Status: Abnormal   Collection Time: 09/28/24  8:00 PM  Result Value Ref Range   RBC / HPF 0-5 0 - 5 RBC/hpf   WBC, UA >50 0 - 5 WBC/hpf   Bacteria, UA MANY (A) NONE SEEN   Squamous Epithelial / HPF 0-5 0 - 5 /HPF   Non Squamous Epithelial PRESENT (A) NONE SEEN   Mucus PRESENT     Imaging:  US  OB LESS THAN 14 WEEKS WITH OB TRANSVAGINAL Result Date: 09/28/2024 EXAM: ULTRASOUND FIRST TRIMESTER TECHNIQUE: Transabdominal and Transvaginal first trimester obstetric pelvic duplex  ultrasound was performed with real-time imaging, color flow Doppler imaging, and spectral analysis. COMPARISON: None available. CLINICAL HISTORY: [id number] Abdominal pain affecting pregnancy [id number] Abdominal pain affecting pregnancy FINDINGS: UTERUS: No focal myometrial mass. The cervix is unremarkable and is closed. Edc is electrolytes and a 3.26. GESTATIONAL SAC(S): Single normal appearing gestational  sac. Small subchorionic hemorrhage noted. YOLK SAC: Present EMBRYO(<11WK) /FETUS(>=11WK): Single CROWN RUMP LENGTH: 21 mm corresponding to an estimated gestational age of [redacted] weeks, 5 days. RATE OF CARDIAC ACTIVITY: Fetal cardiac activity is noted at 174 beats per minute. RIGHT OVARY: Unremarkable. Normal arterial and venous flow. Follicle noted within the right ovary. No adnexal masses. LEFT OVARY: Unremarkable. Normal arterial and venous flow. No adnexal masses. FREE FLUID: No free fluid within the pelvis. MEASUREMENTS ESTIMATED GESTATIONAL AGE BY CURRENT ULTRASOUND: 8 weeks, 5 days. ESTIMATED GESTATIONAL AGE BY LMP/PRIOR ULTRASOUND: ESTIMATED DUE DATE: IMPRESSION: 1. Single live intrauterine pregnancy with embryonic cardiac activity at 174 beats per minute. 2. Estimated gestational age by current ultrasound is 8 weeks, 5 days. 3. Small subchorionic hemorrhage. Electronically signed by: Dorethia Molt MD 09/28/2024 09:52 PM EST RP Workstation: [id number]    MDM & MAU COURSE  MDM: Moderate  MAU Course: Orders Placed This Encounter  Procedures   US  OB LESS THAN 14 WEEKS WITH OB TRANSVAGINAL   Urinalysis, Routine w reflex microscopic -Urine, Clean Catch   Comprehensive metabolic panel   Urinalysis, Microscopic (reflex)   Insert peripheral IV   Meds ordered this encounter  Medications   lactated ringers  bolus 1,000 mL   ondansetron  (ZOFRAN -ODT) disintegrating tablet 8 mg   VSS. Plan for ultrasound and UA to assess for dehydration. Zofran  given.  10p - US  showing single IUP and small subchorionic  hemorrhage. UA showing ketones. Will put patient in a room and give IVF.  ASSESSMENT  No diagnosis found.  PLAN  Discharge home in stable condition with return precautions.  ***    Allergies as of 09/28/2024       Reactions   Amoxicillin Hives   Penicillins Hives   Has patient had a PCN reaction causing immediate rash, facial/tongue/throat swelling, SOB or lightheadedness with hypotension: Yes Has patient had a PCN reaction causing severe rash involving mucus membranes or skin necrosis: No Has patient had a PCN reaction that required hospitalization Yes Has patient had a PCN reaction occurring within the last 10 years: No If all of the above answers are NO, then may proceed with Cephalosporin use.     Med Rec must be completed prior to using this SMARTLINK***       Charlie Courts, MD  Family Medicine - Obstetrics Fellow        [1]  Social History Tobacco Use   Smoking status: Former    Types: Cigars    Quit date: 2020    Years since quitting: 5.9   Smokeless tobacco: Never   Tobacco comments:    not smoked since pregnant, passive smoker  Vaping Use   Vaping status: Former   Quit date: 12/11/2022  Substance Use Topics   Alcohol  use: Yes    Comment: soc.   Drug use: Not Currently    Types: Marijuana, Other-see comments    Comment: last smoked 10/2022, eats edibles  [2]  Allergies Allergen Reactions   Amoxicillin Hives   Penicillins Hives    Has patient had a PCN reaction causing immediate rash, facial/tongue/throat swelling, SOB or lightheadedness with hypotension: Yes Has patient had a PCN reaction causing severe rash involving mucus membranes or skin necrosis: No Has patient had a PCN reaction that required hospitalization Yes Has patient had a PCN reaction occurring within the last 10 years: No If all of the above answers are NO, then may proceed with Cephalosporin use.

## 2024-09-28 NOTE — MAU Note (Signed)
 Dr Jomarie aware pt feels better and is drinking ginger ale. Awaiting lab results.

## 2024-12-22 ENCOUNTER — Ambulatory Visit: Admitting: Neurology
